# Patient Record
Sex: Female | Born: 1940 | Race: White | Hispanic: No | Smoking: Former smoker
Health system: Southern US, Community
[De-identification: ages and names within clinical notes are randomized; demographics above are authoritative.]

## PROBLEM LIST (undated history)

## (undated) DIAGNOSIS — D649 Anemia, unspecified: Secondary | ICD-10-CM

## (undated) DIAGNOSIS — I341 Nonrheumatic mitral (valve) prolapse: Secondary | ICD-10-CM

## (undated) DIAGNOSIS — M1990 Unspecified osteoarthritis, unspecified site: Secondary | ICD-10-CM

## (undated) DIAGNOSIS — F419 Anxiety disorder, unspecified: Secondary | ICD-10-CM

## (undated) DIAGNOSIS — E785 Hyperlipidemia, unspecified: Secondary | ICD-10-CM

## (undated) DIAGNOSIS — E119 Type 2 diabetes mellitus without complications: Secondary | ICD-10-CM

## (undated) DIAGNOSIS — J189 Pneumonia, unspecified organism: Secondary | ICD-10-CM

## (undated) DIAGNOSIS — R011 Cardiac murmur, unspecified: Secondary | ICD-10-CM

## (undated) DIAGNOSIS — K219 Gastro-esophageal reflux disease without esophagitis: Secondary | ICD-10-CM

## (undated) HISTORY — PX: CARPAL TUNNEL RELEASE: SHX101

## (undated) HISTORY — PX: EYE SURGERY: SHX253

## (undated) HISTORY — PX: OTHER SURGICAL HISTORY: SHX169

## (undated) HISTORY — PX: ROTATOR CUFF REPAIR: SHX139

## (undated) HISTORY — PX: ABDOMINAL HYSTERECTOMY: SHX81

## (undated) HISTORY — PX: TONSILLECTOMY: SUR1361

## (undated) HISTORY — DX: Hyperlipidemia, unspecified: E78.5

## (undated) HISTORY — DX: Type 2 diabetes mellitus without complications: E11.9

---

## 1997-11-04 ENCOUNTER — Encounter: Admission: RE | Admit: 1997-11-04 | Discharge: 1998-02-02 | Payer: Self-pay | Admitting: Family Medicine

## 1999-03-18 ENCOUNTER — Emergency Department (HOSPITAL_COMMUNITY): Admission: EM | Admit: 1999-03-18 | Discharge: 1999-03-18 | Payer: Self-pay | Admitting: Emergency Medicine

## 1999-03-18 ENCOUNTER — Encounter: Payer: Self-pay | Admitting: Orthopedic Surgery

## 1999-06-20 ENCOUNTER — Other Ambulatory Visit: Admission: RE | Admit: 1999-06-20 | Discharge: 1999-06-20 | Payer: Self-pay | Admitting: Family Medicine

## 2001-08-08 ENCOUNTER — Encounter: Payer: Self-pay | Admitting: Gynecology

## 2001-08-14 ENCOUNTER — Inpatient Hospital Stay (HOSPITAL_COMMUNITY): Admission: RE | Admit: 2001-08-14 | Discharge: 2001-08-15 | Payer: Self-pay | Admitting: Gynecology

## 2002-08-14 ENCOUNTER — Encounter: Payer: Self-pay | Admitting: Chiropractic Medicine

## 2002-08-14 ENCOUNTER — Encounter: Admission: RE | Admit: 2002-08-14 | Discharge: 2002-08-14 | Payer: Self-pay | Admitting: Chiropractic Medicine

## 2002-12-23 ENCOUNTER — Other Ambulatory Visit: Admission: RE | Admit: 2002-12-23 | Discharge: 2002-12-23 | Payer: Self-pay | Admitting: Gynecology

## 2003-12-24 ENCOUNTER — Other Ambulatory Visit: Admission: RE | Admit: 2003-12-24 | Discharge: 2003-12-24 | Payer: Self-pay | Admitting: Gynecology

## 2004-11-30 ENCOUNTER — Other Ambulatory Visit: Admission: RE | Admit: 2004-11-30 | Discharge: 2004-11-30 | Payer: Self-pay | Admitting: Gynecology

## 2005-01-10 ENCOUNTER — Encounter: Admission: RE | Admit: 2005-01-10 | Discharge: 2005-01-10 | Payer: Self-pay | Admitting: Family Medicine

## 2005-07-01 ENCOUNTER — Ambulatory Visit (HOSPITAL_COMMUNITY): Admission: RE | Admit: 2005-07-01 | Discharge: 2005-07-01 | Payer: Self-pay | Admitting: Neurosurgery

## 2005-12-01 ENCOUNTER — Other Ambulatory Visit: Admission: RE | Admit: 2005-12-01 | Discharge: 2005-12-01 | Payer: Self-pay | Admitting: Gynecology

## 2006-02-06 ENCOUNTER — Encounter: Admission: RE | Admit: 2006-02-06 | Discharge: 2006-02-06 | Payer: Self-pay | Admitting: Family Medicine

## 2006-12-06 ENCOUNTER — Other Ambulatory Visit: Admission: RE | Admit: 2006-12-06 | Discharge: 2006-12-06 | Payer: Self-pay | Admitting: Gynecology

## 2007-02-11 ENCOUNTER — Encounter: Admission: RE | Admit: 2007-02-11 | Discharge: 2007-02-11 | Payer: Self-pay | Admitting: Family Medicine

## 2007-10-01 ENCOUNTER — Emergency Department (HOSPITAL_COMMUNITY): Admission: EM | Admit: 2007-10-01 | Discharge: 2007-10-01 | Payer: Self-pay | Admitting: Emergency Medicine

## 2010-05-20 NOTE — Op Note (Signed)
Rose Smith, Rose Smith                      ACCOUNT NO.:  [id number]   MEDICAL RECORD NO.:  [id number]                   PATIENT TYPE:  INP   LOCATION:  X001                                 FACILITY:  [facility]   PHYSICIAN:  Lyndal Pulley. Chales Salmon, M.D.                DATE OF BIRTH:  1940-01-07   DATE OF PROCEDURE:  08/14/2001  DATE OF DISCHARGE:                                 OPERATIVE REPORT   PREOPERATIVE DIAGNOSES:  1. Bilateral upper eyelid ptosis and dermatochalasis.  2. 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  3. Submental lipodystrophy.  4. Full face dermatochalasis.   POSTOPERATIVE DIAGNOSES:  1. Bilateral upper eyelid ptosis and dermatochalasis.  2. 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  3. Submental lipodystrophy.  4. Full face dermatochalasis.   OPERATIONS PERFORMED:  1. Submental liposuction.  2. Bilateral upper eyelid blepharoplasties with ptosis repair (levator     repair).  3. Bilateral lower eyelid transconjunctival blepharoplasty.  4. Full face medium depth chemical peel.   SURGEON:  Lyndal Pulley. Chales Salmon, M.D.   FIRST ASSISTANT:  Dr. Vedia Coffer.   ANESTHESIA:  General via oral endotracheal tube.   INDICATIONS FOR PROCEDURE:  The patient is a 70 year old lady who is  simultaneously undergoing gynecological surgery with Dr. Nicholas Lose. She was seen  in the office complaining of difficulties with her upper eyelids and was  diagnosed with bilateral upper eyelid ptosis.   DESCRIPTION OF PROCEDURE:  The surgery followed Dr. Johnn Hai gynecologic  surgery. The patient was already under anesthesia via oral endotracheal  tube. Once Dr. Nicholas Lose had completed his surgery, attention was directed to  the head and neck area where the patient was prepped and draped in the usual  fashion for facial cosmetic procedures. Attention initially was directed to  the submental area where there was small stab incision in the submental  crease.   Approximately 100 cc of tumescent fluid was infiltrated in the  subcutaneous plane. The tumescent fluid consisted of 500 cc of lactated  Ringer's, 400 mg of lidocaine and 1 mg of epinephrine.   Approximately ten minutes were allowed for hemostasis and anesthesia and  through the small stab incision, a 3 mm spatula-shaped liposuction cannula  was used to create a subcutaneous dissection plane. This was carried out  over the entire submental and submandibular areas. Two other small stab  incisions were created just beneath the earlobes bilaterally where the  submandibular areas were treated further. A 4 mm cobra tip liposuction  cannula was then used to complete the liposuction in a subcutaneous,  supraepithelial plane. The pinch test was used for adequate removal of fat  and symmetry. The liposuction was also performed through the lobular  incisions as well, further defining the submandibular areas.  Once the  liposuction was completed, the small fat incisions were closed with 5-0  plain gut suture.   Attention was then directed to the upper eyelid where  a fine surgical marker  was used to outline a new supratarsal crease. This was measured  approximately 9 mm from the lash line.  A micro-T forceps was then used to  determine the amount of excess upper eyelid skin to remove and again  outlined with a fine surgical marker. Approximately 2 cc of 2% lidocaine  with 100,000 epinephrine was infiltrated subcutaneously along the entire  right upper eyelid. The same was done for the left upper eyelid and  approximately 15 minutes was allowed for hemostasis and anesthesia. A #15  scalpel was used to make the incision and fine-toothed forceps and soft  tissue scissors were used to remove the excess upper eyelid skin down to the  underlying orbicularis oculi muscle. Hemostasis was achieved throughout the  procedure with monopolar electrocautery. A strip of orbicularis muscle was  then also removed  down to the underlying orbital septum.   Using globe pressure, the two upper eyelid fat pads were identified and  through small incisions made in the orbital septum, the herniated fat was  cauterized at its base and removed. The orbital septum was then dissected  free from the underlying levator muscle and it was found to have a large  dehiscence. The superior edge of the tarsal plate was identified and using a  6-0 Vicryl suture, a single interrupted stitch was placed through the  superior margin of the tarsal plate to the free end of the head of the  levator muscle. This corrected the dehiscence in the levator muscle. The  skin was then closed first placing three separate interrupted tarsal  fixation sutures to better define the supratarsal crease. The remaining  wound was then closed with 6-0 plain gut suture in a running baseball  fashion.   Attention was then directed to the left upper eyelid where the identical  procedure was performed taking care to produce symmetry. Approximately 3 cc  of 2% lidocaine with 1:100,000 epinephrine was then infiltrated in a  subconjunctival plane in both lower eyelids. The globe, __________  retractor and Lacrilube solution and using adequate retraction, a  transconjunctival incision was created with a pinpoint electrocautery. The  incision was made through the conjunctiva and capsulopalpebral fascia  identifying the underlying three lower eyelid fat pads. Again using light  globe pressure, the herniated fat pads were identified, cauterized at their  base and removed.   An identical procedure was performed on the left lower eyelid again taking  care to produce symmetry. Once the blepharoplasties were done, the entire  face was then degreased with an acetone solution. This was followed by  modified Jessner's solution and then followed by 35% PPA solution. This  produced a fairly easy frosting over the entire face completing the full facial chemical  peel. Plain 35% PPA was then used to feather the chemical  peel just below the inferior border of the mandible bilaterally. Once the  frosting was complete, the entire face and incision lines were coated with a  Neosporin ointment. A 1/4 inch foam dressing was placed in the submental  area and held secure with a 4 inch Ace wrap.   The patient was then allowed to awaken from the anesthesia, extubated in the  operating room and transferred to the postanesthesia care unit in stable  condition having tolerated the procedure well. Estimated blood loss was less  than 20 cc. Intraoperative medications included a gram of Ancef and 10 mg of  Decadron.  Lyndal Pulley Chales Salmon, M.D.    TGO/MEDQ  D:  08/14/2001  T:  08/16/2001  Job:  [id number]

## 2010-05-20 NOTE — Discharge Summary (Signed)
NAME:  Rose Smith, Rose Smith                       ACCOUNT NO.:  [id number]   MEDICAL RECORD NO.:  [id number]                   PATIENT TYPE:  INP   LOCATION:  0457                                 FACILITY:  [facility]   PHYSICIAN:  Gretta Cool, M.D.              DATE OF BIRTH:  1940-08-11   DATE OF ADMISSION:  08/14/2001  DATE OF DISCHARGE:  08/15/2001                                 DISCHARGE SUMMARY   HISTORY OF PRESENT ILLNESS:  The patient is a 70 year old female gravida 4,  para 4 who has a history of large deliveries with her last two children  weighing 10 and 11 pounds delivered in [place], [place].  Since  those deliveries she has had difficulty with constipation chronically and  straining with stool.  She has developed pelvic organ prolapse and now  significant difficulty with urge incontinence.  She does report minor stress  component as well.  Neurologic examination of the perineum shows intact  reflexes and sensation.  There is no evidence of neurologic injury to the  lower extremities.  There is evidence of a fourth degree extension of her  episiotomy with poor repair of perineal musculature.  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  She is now admitted for  definitive therapy by posterior enterocele repairs and vaginal vault  suspension.  She has had history of hysterectomy elsewhere vaginally.  Risks  and benefits have been discussed with the patient and she accepts these  procedures.   PHYSICAL EXAMINATION:  CHEST:  Clear to A&P.  HEART:  Rate and rhythm were regular without murmur, gallop, cardiac  enlargement.  ABDOMEN:  Soft and scaphoid without masses or organomegaly.  PELVIC:  External genitalia within normal limits for female with the  exception of a gaping introitus very much widened.  She has reasonably well  preserved anterior support but the vaginal cuff extends to the mid vagina.  There is extremely poor posterior  support with a grade 3 enterocele and a  grade 4 rectocele.  She has virtually complete loss of the perineal body  musculature.  A very thin sphincter with weak internal and external  sphincter.  The rectovaginal examination confirms.  Cervix and uterus are  surgically absent.  Adnexa bilaterally clear.   IMPRESSION:  1. 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  2. 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  3. Dyslipidemia.  4. Depression.  5. Arthritis.   PLAN:  Procedures, risks, and benefits as well as recovery have been  discussed at length with the patient and she accepts these procedures.   LABORATORY DATA:  Admission hemoglobin 13.8, hematocrit 39.4.  EKG:  Normal  sinus rhythm with nonspecific T-wave abnormality, minor.  Chest x-ray:  No  active lung disease, hyperaeration suggestive of COPD.  HOSPITAL COURSE:  The patient underwent rectocele and enterocele and vault  repairs under general anesthesia.  The procedures were completed without any  complications and the patient was returned to the recovery room in excellent  condition.  Banana suprapubic Cystocath was placed and secured.  There were  no complications and the patient was returned to the recovery room in  excellent condition.  Her postoperative course was without complications and  she was discharged on the second postoperative day in excellent condition.   DISCHARGE INSTRUCTIONS:  Her final discharge instructions included no heavy  lifting or straining, no vaginal entrance, and increased ambulation as  tolerated.  She is to call for any fever over 100.5 or failure of daily  improvement.   DIET:  Regular.   DISCHARGE MEDICATIONS:  Tylox one p.o. q.2-4h. p.r.n. discomfort.  Preoperative medications as prescribed.   FOLLOW UP:  She is to return to the office in 24-72 hours to remove the  suprapubic  catheter.   FINAL DISCHARGE DIAGNOSES:  1. Pelvic organ prolapse.  2. Rectocele and enterocele.  3. Vaginal vault descensus.   PROCEDURES PERFORMED:  Posterior enterocele repairs and vaginal vault  repairs under general anesthesia.     Rose Smith, N.P.                          Gretta Cool, M.D.    EMK/MEDQ  D:  08/28/2001  T:  08/28/2001  Job:  [id number]

## 2010-05-20 NOTE — Op Note (Signed)
Rose Smith, Rose Smith                      ACCOUNT NO.:  [id number]   MEDICAL RECORD NO.:  [id number]                   PATIENT TYPE:  INP   LOCATION:  0457                                 FACILITY:  [facility]   PHYSICIAN:  Gretta Cool, M.D.              DATE OF BIRTH:  07/19/1940   DATE OF PROCEDURE:  08/14/2001  DATE OF DISCHARGE:  08/15/2001                                 OPERATIVE REPORT   PREOPERATIVE DIAGNOSES:  1. Pelvic organ prolapse.  2. Rectocele and enterocele.  3. Vault descensus.   POSTOPERATIVE DIAGNOSES:  1. Pelvic organ prolapse.  2. Rectocele and enterocele.  3. Vault descensus.   PROCEDURE:  T&E and vault repairs.   SURGEON:  Gretta Cool, M.D.   ASSISTANT:  Raynald Kemp, M.D.   ANESTHESIA:  General orotracheal anesthesia.   DESCRIPTION OF PROCEDURE:  Under excellent general anesthesia as above with  the patient prepped and draped in the lithotomy position in [place] stirrups,  mucosa of the posterior vagina was grasped at the hymenal ring with Allis  clamps and measured to allow adequate bidigital introitus.  At this point,  the intervening mucosa was excised in a V-shaped incision. The mucosa was  then dissected from the perirectal fascia to the apex of the vaginal cuff.  At this point, the mucosa was reflected by blunt and sharp dissection.  An  enormous enterocele was exposed. There was transverse detachment of the  fascia from the apex of the cuff.  The enterocele occupied the upper half of  the vagina.  At this point, the enterocele sac was excised and plicated with  two pursestring sutures of 2-0 Vicryl.  At this point, the vaginal vault  suspension was completed as a cardinal uterosacral complex colposuspension.  The uterosacral complex was identified and secured with Ethibond sutures to  the lateral vaginal wall.  The vaginal wall was pulled thus back to high  position and secured to the uterosacral cardinal complex.  A central  fascial  defect was also identified and that was approximated from the apex of the  cuff with the fascia secured to the apex of the center of the cuff with  interrupted sutures of 2-0 Vicryl.  At this point, the mucosa was trimmed  and the upper layers of perirectal fascia and mucosa closed as a single  running subcuticular closure of 2-0 Vicryl.  At this point, the levator  muscles are plicated in the midline and the levator fascia was ligated in  the midline.  The perineal body musculature was reapproximated with  interrupted sutures of 2-0 Vicryl.  The remainder of the vaginal mucosa and  skin was closed with a subcuticular closure of 2-0 Vicryl.  At the end of  the procedure, sponge and pack counts were correct.  The banana suprapubic Cystocath was placed and secured with 0 Ethibond to  allow for the possibility that she may not void  immediately postoperatively  in order to have the suprapubic catheter in timely fashion if she has  voiding delay.                                               Gretta Cool, M.D.    CWL/MEDQ  D:  08/14/2001  T:  08/16/2001  Job:  [id number]   cc:   Talmadge Coventry, M.D.

## 2010-05-20 NOTE — H&P (Signed)
NAME:  Rose Smith, Rose Smith                       ACCOUNT NO.:  [id number]   MEDICAL RECORD NO.:  [id number]                   PATIENT TYPE:  INP   LOCATION:  0457                                 FACILITY:  [facility]   PHYSICIAN:  Gretta Cool, M.D.              DATE OF BIRTH:  Sep 30, 1940   DATE OF ADMISSION:  08/14/2001  DATE OF DISCHARGE:  08/15/2001                                HISTORY & PHYSICAL   CHIEF COMPLAINT:  Pelvic support problems.   HISTORY OF PRESENT ILLNESS:  A 70 year old gravida 4, para 4 under the  primary care of Dr. Sonia Baller.  She has a history of extremely large  deliveries with her last two children weighing 10 and 11 pounds, delivered  by Dr. __________ in [place], [place].  She has had, since those  deliveries, difficulty with constipation chronically and has had straining  at stool.  She has developed progressively severe pelvic organ prolapse and  now has significant difficulty with urge incontinence.  She has minor stress  component as well.  By history, she has difficulty controlling and even  stool.  On neurologic examination of her perineum, she has intact reflexes  and sensation.  She has no evidence of neurologic injury to her lower  extremities.  She has evidence of an extensive fourth degree extension of  her episiotomy with poor repair of her perineal body musculature.  She has  enormous enterocele and rectocele with some vault descensus.  She is now  admitted for definitive therapy by posterior and enterocele repairs and  vault suspension.  She has a previous history of hysterectomy elsewhere by  Dr. Victoriano Lain, vaginal hysterectomy by history.  She understands the risks and  benefits of the procedure, also understands that we will not address the  problem of incontinence of gas and stool.  Have discussed the poor response  of repairs in patients over 60, so far as reports from the literature, even  in the best of hands.  We have also  discussed the possible need for a sling-  type procedure such as Sparc or TVT,  subsequently, if she has progression  of her urine incontinence.   PAST MEDICAL HISTORY:  She has had the usual childhood diseases without  sequelae.   MEDICAL ILLNESSES:  Arthritis, hypercholesterolemia.   PAST SURGICAL HISTORY:  History of carpal tunnel syndrome and repair.  Tubal  sterilization 1970.  Partial hysterectomy per Dr. Victoriano Lain, vaginal  hysterectomy per Dr. Victoriano Lain in 1986.  Rotator cuff repairs in 1994 and 1996.   ACCIDENTS/INJURIES:  None.   PRESENT MEDICATIONS:  1. Hormone replacement therapy by patch, recently switched from Estratest.  2. Lipitor for dyslipidemia.  3. Bextra for arthritis.  4. Effexor 75 q.d. for depression.   ALLERGIES:  PENICILLIN.   FAMILY HISTORY:  Father died at age 17 during hip surgery.  Mother is 37  with dementia.  Father had prostate  cancer.  Mother has hypertension.  No  other known familial tendencies.   SOCIAL HISTORY:  The patient is an only child.  She has four children of her  own.   REVIEW OF SYMPTOMS:  HEENT:  Denies symptoms.  CARDIORESPIRATORY:  Denies  asthma, cough, bronchitis, shortness of breath.  GI/GU:  Denies frequency,  urgency, dysuria, change in bowel habits, food intolerance.   PHYSICAL EXAMINATION:  GENERAL APPEARANCE:  A well-developed, well-nourished  white female considerably over ideal weight but high abdomen to hip ratio.  HEENT:  Pupils are equal, round, reactive to light and accommodation.  Fundi  not examined.  NECK:  Supple without mass or thyroid enlargement.  Carotids have good  pulses without bruits.  Node bearing areas are negative.  BREASTS:  Soft without mass, nodes or nipple discharge.  CHEST:  Clear to percussion and auscultation  CARDIOVASCULAR:  Regular rhythm without murmur or cardiac enlargement.  ABDOMEN:  Soft without mass or organomegaly.  PELVIC:  External genitalia normal female but gaping introitus with  very  much widened genital hiatus.  She has reasonably well preserved anterior  support but the vaginal cuff descends to mid vagina.  She has extremely poor  posterior support with a grade III enterocele and grade IV rectocele.  She  has virtually complete loss of the perineal body musculature and very thin  sphincter with weak internal and external sphincter.  Rectovaginal  confirmed.  Cervix and uterus surgically absent.  Adnexa clear.  EXTREMITIES:  Negative.  NEUROLOGIC:  Physiologic.   IMPRESSION:  1. 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  2. 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  3. Dyslipidemia.  4. Depression.  5. Arthritis.   PLAN:  I have discussed all options with the patient as well as risks and  benefits.  She understands particularly the risk of long delay of return to  comfortable intercourse and possible need for dilators and vaginal estrogen  hormone replacement. She understands also the possible need for subsequent  incontinence surgery such at TVT or Sparc.  She also understands that her  incontinence of gas and stool may not be significantly improved.  She also  understands that she must always avoid straining at stool to avoid injuring  her repair and further progression of her support weakness.                                                Gretta Cool, M.D.    CWL/MEDQ  D:  08/14/2001  T:  08/16/2001  Job:  [id number]   cc:   Talmadge Coventry, M.D.

## 2010-10-03 LAB — DIFFERENTIAL
Basophils Relative: 0
Eosinophils Absolute: 0.2
Eosinophils Relative: 2
Lymphs Abs: 1.9
Monocytes Relative: 6
Neutrophils Relative %: 73

## 2010-10-03 LAB — POCT I-STAT, CHEM 8
Creatinine, Ser: 1.1
Glucose, Bld: 136 — ABNORMAL HIGH
HCT: 42
Hemoglobin: 14.3
Potassium: 3.6
TCO2: 30

## 2010-10-03 LAB — URINALYSIS, ROUTINE W REFLEX MICROSCOPIC
Bilirubin Urine: NEGATIVE
Ketones, ur: NEGATIVE
Nitrite: NEGATIVE
Protein, ur: NEGATIVE
Specific Gravity, Urine: 1.009
Urobilinogen, UA: 0.2

## 2010-10-03 LAB — CBC
HCT: 40.6
MCHC: 33.5
MCV: 91.5
Platelets: 323

## 2010-10-03 LAB — URINE MICROSCOPIC-ADD ON

## 2011-04-23 ENCOUNTER — Other Ambulatory Visit: Payer: Self-pay | Admitting: Orthopedic Surgery

## 2011-04-23 MED ORDER — DEXAMETHASONE SODIUM PHOSPHATE 10 MG/ML IJ SOLN: 10.0000 mg | Freq: Once | INTRAMUSCULAR | Status: DC

## 2011-04-23 MED ORDER — BUPIVACAINE LIPOSOME 1.3 % IJ SUSP: 20.0000 mL | Freq: Once | INTRAMUSCULAR | Status: DC

## 2011-04-30 ENCOUNTER — Other Ambulatory Visit: Payer: Self-pay | Admitting: Orthopedic Surgery

## 2011-04-30 MED ORDER — DEXAMETHASONE SODIUM PHOSPHATE 10 MG/ML IJ SOLN: 10.0000 mg | Freq: Once | INTRAMUSCULAR | Status: DC

## 2011-04-30 MED ORDER — BUPIVACAINE LIPOSOME 1.3 % IJ SUSP: 20.0000 mL | Freq: Once | INTRAMUSCULAR | Status: DC

## 2011-07-14 ENCOUNTER — Encounter (HOSPITAL_COMMUNITY): Payer: Self-pay | Admitting: Pharmacy Technician

## 2011-07-20 NOTE — Patient Instructions (Signed)
20 ENZLEY KITCHENS  07/20/2011   Your procedure is scheduled on:  07/26/11 1000-1100  Report to [facility] at 0730 AM.  Call this number if you have problems the morning of surgery: [phone]   Remember:   Do not eat food:After Midnight.  May have clear liquids:until Midnight .   Take these medicines the morning of surgery with A SIP OF WATER:    Do not wear jewelry, make-up or nail polish.  Do not wear lotions, powders, or perfumes  Do not shave 48 hours prior to surgery.   Do not bring valuables to the hospital.  Contacts, dentures or bridgework may not be worn into surgery.  Leave suitcase in the car. After surgery it may be brought to your room.  For patients admitted to the hospital, checkout time is 11:00 AM the day of discharge.      Special Instructions: CHG Shower Use Special Wash: 1/2 bottle night before surgery and 1/2 bottle morning of surgery. shower chin to toes with CHG.  Wash face and private parts with regular soap.     Please read over the following fact sheets that you were given: MRSA Information, coughing and deep breathing exercises, leg exercises

## 2011-07-21 ENCOUNTER — Encounter (HOSPITAL_COMMUNITY)
Admission: RE | Admit: 2011-07-21 | Discharge: 2011-07-21 | Disposition: A | Discharge status: Home / Self Care | Payer: Medicare Other | Source: Ambulatory Visit | Attending: Orthopedic Surgery | Admitting: Orthopedic Surgery

## 2011-07-21 ENCOUNTER — Encounter (HOSPITAL_COMMUNITY): Payer: Self-pay

## 2011-07-21 HISTORY — DX: Cardiac murmur, unspecified: R01.1

## 2011-07-21 HISTORY — DX: Unspecified osteoarthritis, unspecified site: M19.90

## 2011-07-21 LAB — CBC
Hemoglobin: 11.9 g/dL — ABNORMAL LOW (ref 12.0–15.0)
MCH: 28.7 pg (ref 26.0–34.0)
MCV: 87.7 fL (ref 78.0–100.0)
RBC: 4.14 MIL/uL (ref 3.87–5.11)

## 2011-07-21 LAB — BASIC METABOLIC PANEL
CO2: 25 mEq/L (ref 19–32)
Chloride: 104 mEq/L (ref 96–112)
Glucose, Bld: 97 mg/dL (ref 70–99)
Potassium: 3.8 mEq/L (ref 3.5–5.1)
Sodium: 139 mEq/L (ref 135–145)

## 2011-07-26 ENCOUNTER — Encounter (HOSPITAL_COMMUNITY)
Admission: RE | Disposition: A | Discharge status: Home / Self Care | Payer: Self-pay | Source: Ambulatory Visit | Attending: Orthopedic Surgery

## 2011-07-26 ENCOUNTER — Ambulatory Visit (HOSPITAL_COMMUNITY): Payer: Medicare Other | Admitting: Anesthesiology

## 2011-07-26 ENCOUNTER — Encounter (HOSPITAL_COMMUNITY): Payer: Self-pay | Admitting: *Deleted

## 2011-07-26 ENCOUNTER — Encounter (HOSPITAL_COMMUNITY): Payer: Self-pay | Admitting: Anesthesiology

## 2011-07-26 ENCOUNTER — Ambulatory Visit (HOSPITAL_COMMUNITY)
Admission: RE | Admit: 2011-07-26 | Discharge: 2011-07-27 | Disposition: A | Discharge status: Home / Self Care | Payer: Medicare Other | Source: Ambulatory Visit | Attending: Orthopedic Surgery | Admitting: Orthopedic Surgery

## 2011-07-26 DIAGNOSIS — M6688 Spontaneous rupture of other tendons, other: Secondary | ICD-10-CM | POA: Insufficient documentation

## 2011-07-26 DIAGNOSIS — Z01812 Encounter for preprocedural laboratory examination: Secondary | ICD-10-CM | POA: Insufficient documentation

## 2011-07-26 DIAGNOSIS — M76899 Other specified enthesopathies of unspecified lower limb, excluding foot: Secondary | ICD-10-CM | POA: Insufficient documentation

## 2011-07-26 DIAGNOSIS — I059 Rheumatic mitral valve disease, unspecified: Secondary | ICD-10-CM | POA: Insufficient documentation

## 2011-07-26 DIAGNOSIS — Z01810 Encounter for preprocedural cardiovascular examination: Secondary | ICD-10-CM | POA: Insufficient documentation

## 2011-07-26 DIAGNOSIS — E119 Type 2 diabetes mellitus without complications: Secondary | ICD-10-CM | POA: Insufficient documentation

## 2011-07-26 DIAGNOSIS — Z79899 Other long term (current) drug therapy: Secondary | ICD-10-CM | POA: Insufficient documentation

## 2011-07-26 DIAGNOSIS — M7070 Other bursitis of hip, unspecified hip: Secondary | ICD-10-CM | POA: Diagnosis present

## 2011-07-26 DIAGNOSIS — Z7982 Long term (current) use of aspirin: Secondary | ICD-10-CM | POA: Insufficient documentation

## 2011-07-26 DIAGNOSIS — M47817 Spondylosis without myelopathy or radiculopathy, lumbosacral region: Secondary | ICD-10-CM | POA: Insufficient documentation

## 2011-07-26 HISTORY — PX: EXCISION/RELEASE BURSA HIP: SHX5014

## 2011-07-26 LAB — GLUCOSE, CAPILLARY
Glucose-Capillary: 124 mg/dL — ABNORMAL HIGH (ref 70–99)
Glucose-Capillary: 126 mg/dL — ABNORMAL HIGH (ref 70–99)
Glucose-Capillary: 92 mg/dL (ref 70–99)
Glucose-Capillary: 112 mg/dL — ABNORMAL HIGH (ref 70–99)

## 2011-07-26 SURGERY — RELEASE, BURSA, TROCHANTERIC
Anesthesia: General | Site: Hip | Laterality: Left | Wound class: Clean

## 2011-07-26 MED ORDER — LACTATED RINGERS IV SOLN
INTRAVENOUS | Status: DC
  Administered 2011-07-26: 1000 mL via INTRAVENOUS
  Administered 2011-07-26: 11:00:00 via INTRAVENOUS

## 2011-07-26 MED ORDER — 0.9 % SODIUM CHLORIDE (POUR BTL) OPTIME
TOPICAL | Status: DC | PRN
  Administered 2011-07-26: 1000 mL

## 2011-07-26 MED ORDER — SODIUM CHLORIDE 0.9 % IV SOLN: INTRAVENOUS | Status: DC

## 2011-07-26 MED ORDER — BUPIVACAINE LIPOSOME 1.3 % IJ SUSP
INTRAMUSCULAR | Status: DC | PRN
  Administered 2011-07-26: 20 mL

## 2011-07-26 MED ORDER — SUCCINYLCHOLINE CHLORIDE 20 MG/ML IJ SOLN
INTRAMUSCULAR | Status: DC | PRN
  Administered 2011-07-26: 100 mg via INTRAVENOUS

## 2011-07-26 MED ORDER — ONDANSETRON HCL 4 MG PO TABS: 4.0000 mg | ORAL_TABLET | Freq: Four times a day (QID) | ORAL | Status: DC | PRN

## 2011-07-26 MED ORDER — FENTANYL CITRATE 0.05 MG/ML IJ SOLN
INTRAMUSCULAR | Status: AC
  Filled 2011-07-26: qty 2

## 2011-07-26 MED ORDER — CHLORHEXIDINE GLUCONATE 4 % EX LIQD
60.0000 mL | Freq: Once | CUTANEOUS | Status: DC
  Filled 2011-07-26: qty 60

## 2011-07-26 MED ORDER — ENOXAPARIN SODIUM 40 MG/0.4ML ~~LOC~~ SOLN
40.0000 mg | SUBCUTANEOUS | Status: DC
  Administered 2011-07-27: 40 mg via SUBCUTANEOUS
  Filled 2011-07-26 (×2): qty 0.4

## 2011-07-26 MED ORDER — CEFAZOLIN SODIUM-DEXTROSE 2-3 GM-% IV SOLR
2.0000 g | INTRAVENOUS | Status: AC
  Administered 2011-07-26: 2 g via INTRAVENOUS

## 2011-07-26 MED ORDER — LIDOCAINE HCL (CARDIAC) 20 MG/ML IV SOLN
INTRAVENOUS | Status: DC | PRN
  Administered 2011-07-26: 80 mg via INTRAVENOUS

## 2011-07-26 MED ORDER — CISATRACURIUM BESYLATE (PF) 10 MG/5ML IV SOLN
INTRAVENOUS | Status: DC | PRN
  Administered 2011-07-26: 4 ug via INTRAVENOUS

## 2011-07-26 MED ORDER — KCL IN DEXTROSE-NACL 20-5-0.9 MEQ/L-%-% IV SOLN
INTRAVENOUS | Status: DC
  Administered 2011-07-26: 19:00:00 via INTRAVENOUS
  Filled 2011-07-26 (×3): qty 1000

## 2011-07-26 MED ORDER — GLYCOPYRROLATE 0.2 MG/ML IJ SOLN
INTRAMUSCULAR | Status: DC | PRN
  Administered 2011-07-26: .5 mg via INTRAVENOUS
  Administered 2011-07-26: .2 mg via INTRAVENOUS

## 2011-07-26 MED ORDER — METOCLOPRAMIDE HCL 10 MG PO TABS: 5.0000 mg | ORAL_TABLET | Freq: Three times a day (TID) | ORAL | Status: DC | PRN

## 2011-07-26 MED ORDER — METHOCARBAMOL 500 MG PO TABS
500.0000 mg | ORAL_TABLET | Freq: Four times a day (QID) | ORAL | Status: DC | PRN
  Administered 2011-07-26: 500 mg via ORAL
  Filled 2011-07-26: qty 1

## 2011-07-26 MED ORDER — CEFAZOLIN SODIUM-DEXTROSE 2-3 GM-% IV SOLR
INTRAVENOUS | Status: AC
  Filled 2011-07-26: qty 50

## 2011-07-26 MED ORDER — METFORMIN HCL ER 500 MG PO TB24
1000.0000 mg | ORAL_TABLET | Freq: Every evening | ORAL | Status: DC
  Filled 2011-07-26 (×2): qty 2

## 2011-07-26 MED ORDER — SIMVASTATIN 20 MG PO TABS
20.0000 mg | ORAL_TABLET | Freq: Every day | ORAL | Status: DC
  Administered 2011-07-26: 20 mg via ORAL
  Filled 2011-07-26 (×2): qty 1

## 2011-07-26 MED ORDER — BUPIVACAINE LIPOSOME 1.3 % IJ SUSP
20.0000 mL | Freq: Once | INTRAMUSCULAR | Status: DC
  Filled 2011-07-26 (×2): qty 20

## 2011-07-26 MED ORDER — ASPIRIN EC 81 MG PO TBEC
81.0000 mg | DELAYED_RELEASE_TABLET | Freq: Every day | ORAL | Status: DC
  Administered 2011-07-26 – 2011-07-27 (×2): 81 mg via ORAL
  Filled 2011-07-26 (×2): qty 1

## 2011-07-26 MED ORDER — PREGABALIN 75 MG PO CAPS
75.0000 mg | ORAL_CAPSULE | Freq: Every day | ORAL | Status: DC
  Filled 2011-07-26: qty 1

## 2011-07-26 MED ORDER — MIDAZOLAM HCL 5 MG/5ML IJ SOLN
INTRAMUSCULAR | Status: DC | PRN
  Administered 2011-07-26: 2 mg via INTRAVENOUS

## 2011-07-26 MED ORDER — PROPOFOL 10 MG/ML IV BOLUS
INTRAVENOUS | Status: DC | PRN
  Administered 2011-07-26: 150 mg via INTRAVENOUS
  Administered 2011-07-26 (×2): 50 mg via INTRAVENOUS

## 2011-07-26 MED ORDER — DEXTROSE 5 % IV SOLN
500.0000 mg | Freq: Four times a day (QID) | INTRAVENOUS | Status: DC | PRN
  Administered 2011-07-26: 500 mg via INTRAVENOUS
  Filled 2011-07-26: qty 5

## 2011-07-26 MED ORDER — FENTANYL CITRATE 0.05 MG/ML IJ SOLN
INTRAMUSCULAR | Status: DC | PRN
  Administered 2011-07-26: 100 ug via INTRAVENOUS

## 2011-07-26 MED ORDER — METOCLOPRAMIDE HCL 5 MG/ML IJ SOLN: 5.0000 mg | Freq: Three times a day (TID) | INTRAMUSCULAR | Status: DC | PRN

## 2011-07-26 MED ORDER — LACTATED RINGERS IV SOLN: INTRAVENOUS | Status: DC

## 2011-07-26 MED ORDER — SODIUM CHLORIDE 0.9 % IJ SOLN
INTRAMUSCULAR | Status: DC | PRN
  Administered 2011-07-26: 50 mL

## 2011-07-26 MED ORDER — ONDANSETRON HCL 4 MG/2ML IJ SOLN: 4.0000 mg | Freq: Four times a day (QID) | INTRAMUSCULAR | Status: DC | PRN

## 2011-07-26 MED ORDER — FENTANYL CITRATE 0.05 MG/ML IJ SOLN
25.0000 ug | INTRAMUSCULAR | Status: AC | PRN
  Administered 2011-07-26 (×6): 25 ug via INTRAVENOUS

## 2011-07-26 MED ORDER — BLISTEX EX OINT
TOPICAL_OINTMENT | CUTANEOUS | Status: AC
  Filled 2011-07-26: qty 10

## 2011-07-26 MED ORDER — ACETAMINOPHEN 10 MG/ML IV SOLN
1000.0000 mg | Freq: Once | INTRAVENOUS | Status: AC
  Administered 2011-07-26: 1000 mg via INTRAVENOUS

## 2011-07-26 MED ORDER — ONDANSETRON HCL 4 MG/2ML IJ SOLN
INTRAMUSCULAR | Status: DC | PRN
  Administered 2011-07-26: 4 mg via INTRAVENOUS

## 2011-07-26 MED ORDER — ATROPINE SULFATE 0.4 MG/ML IJ SOLN
INTRAMUSCULAR | Status: DC | PRN
  Administered 2011-07-26: 0.4 mg via INTRAVENOUS

## 2011-07-26 MED ORDER — DICLOFENAC SODIUM 75 MG PO TBEC
75.0000 mg | DELAYED_RELEASE_TABLET | Freq: Every day | ORAL | Status: DC
  Administered 2011-07-26: 75 mg via ORAL
  Filled 2011-07-26 (×2): qty 1

## 2011-07-26 MED ORDER — OXYCODONE-ACETAMINOPHEN 5-325 MG PO TABS
1.0000 | ORAL_TABLET | ORAL | Status: DC | PRN
  Administered 2011-07-26 – 2011-07-27 (×3): 2 via ORAL
  Filled 2011-07-26 (×3): qty 2

## 2011-07-26 MED ORDER — ACETAMINOPHEN 10 MG/ML IV SOLN
INTRAVENOUS | Status: AC
  Filled 2011-07-26: qty 100

## 2011-07-26 MED ORDER — NEOSTIGMINE METHYLSULFATE 1 MG/ML IJ SOLN
INTRAMUSCULAR | Status: DC | PRN
  Administered 2011-07-26: 2.5 mg via INTRAVENOUS

## 2011-07-26 MED ORDER — MORPHINE SULFATE 2 MG/ML IJ SOLN
1.0000 mg | INTRAMUSCULAR | Status: DC | PRN
  Administered 2011-07-26 (×2): 2 mg via INTRAVENOUS
  Filled 2011-07-26 (×2): qty 1

## 2011-07-26 SURGICAL SUPPLY — 40 items
ANCH SUT 2 CP-2 EBND QANCHR+ (Anchor) ×2 IMPLANT
ANCHOR SUPER QUICK (Anchor) ×4 IMPLANT
BAG SPEC THK2 15X12 ZIP CLS (MISCELLANEOUS) ×1
BAG ZIPLOCK 12X15 (MISCELLANEOUS) ×2 IMPLANT
BIT DRILL 2.8 QUICK RELEASE (BIT) ×1 IMPLANT
BLADE EXTENDED COATED 6.5IN (ELECTRODE) IMPLANT
CLOTH BEACON ORANGE TIMEOUT ST (SAFETY) ×2 IMPLANT
DRAPE INCISE IOBAN 66X45 STRL (DRAPES) ×2 IMPLANT
DRAPE ORTHO SPLIT 77X108 STRL (DRAPES) ×4
DRAPE POUCH INSTRU U-SHP 10X18 (DRAPES) ×2 IMPLANT
DRAPE SURG ORHT 6 SPLT 77X108 (DRAPES) ×2 IMPLANT
DRAPE U-SHAPE 47X51 STRL (DRAPES) ×2 IMPLANT
DRILL 2.8 QUICK RELEASE (BIT) ×2
DRSG ADAPTIC 3X8 NADH LF (GAUZE/BANDAGES/DRESSINGS) ×2 IMPLANT
DRSG MEPILEX BORDER 4X4 (GAUZE/BANDAGES/DRESSINGS) IMPLANT
DRSG MEPILEX BORDER 4X8 (GAUZE/BANDAGES/DRESSINGS) IMPLANT
ELECT REM PT RETURN 9FT ADLT (ELECTROSURGICAL) ×2
ELECTRODE REM PT RTRN 9FT ADLT (ELECTROSURGICAL) ×1 IMPLANT
GLOVE BIO SURGEON STRL SZ7.5 (GLOVE) ×2 IMPLANT
GLOVE BIO SURGEON STRL SZ8 (GLOVE) ×4 IMPLANT
GOWN STRL NON-REIN LRG LVL3 (GOWN DISPOSABLE) ×2 IMPLANT
GOWN STRL REIN XL XLG (GOWN DISPOSABLE) ×2 IMPLANT
IV SET HUBERPLUS 22X1 SAFETY (NEEDLE) ×2 IMPLANT
MANIFOLD NEPTUNE II (INSTRUMENTS) ×2 IMPLANT
NS IRRIG 1000ML POUR BTL (IV SOLUTION) ×2 IMPLANT
PACK TOTAL JOINT (CUSTOM PROCEDURE TRAY) ×2 IMPLANT
PASSER SUT SWANSON 36MM LOOP (INSTRUMENTS) ×2 IMPLANT
POSITIONER SURGICAL ARM (MISCELLANEOUS) ×2 IMPLANT
SPONGE GAUZE 4X4 12PLY (GAUZE/BANDAGES/DRESSINGS) ×2 IMPLANT
STAPLER VISISTAT 35W (STAPLE) ×2 IMPLANT
STRIP CLOSURE SKIN 1/2X4 (GAUZE/BANDAGES/DRESSINGS) ×2 IMPLANT
SUT ETHIBOND NAB CT1 #1 30IN (SUTURE) ×4 IMPLANT
SUT MNCRL AB 4-0 PS2 18 (SUTURE) ×2 IMPLANT
SUT VIC AB 1 CT1 27 (SUTURE) ×6
SUT VIC AB 1 CT1 27XBRD ANTBC (SUTURE) ×3 IMPLANT
SUT VIC AB 2-0 CT1 27 (SUTURE) ×6
SUT VIC AB 2-0 CT1 TAPERPNT 27 (SUTURE) ×3 IMPLANT
SYR 30ML LL (SYRINGE) ×2 IMPLANT
TOWEL OR 17X26 10 PK STRL BLUE (TOWEL DISPOSABLE) ×4 IMPLANT
WATER STERILE IRR 1500ML POUR (IV SOLUTION) ×2 IMPLANT

## 2011-07-26 NOTE — Transfer of Care (Signed)
Immediate Anesthesia Transfer of Care Note  Patient: Rose Smith  Procedure(s) Performed: Procedure(s) (LRB): EXCISION/RELEASE BURSA HIP (Left)  Patient Location: PACU  Anesthesia Type: General  Level of Consciousness: awake, oriented and sedated  Airway & Oxygen Therapy: Patient Spontanous Breathing and Patient connected to face mask oxygen  Post-op Assessment: Report given to PACU RN  Post vital signs: Reviewed and stable  Complications: No apparent anesthesia complications

## 2011-07-26 NOTE — Brief Op Note (Signed)
07/26/2011  11:08 AM  PATIENT:  Rose Smith  71 y.o. female  PRE-OPERATIVE DIAGNOSIS:  left hip bursitis with gluteal tendon tear  POST-OPERATIVE DIAGNOSIS:  Left hip bursitis with gluteal tendon tear  PROCEDURE:  Procedure(s) (LRB): EXCISION/RELEASE BURSA HIP (Left) WITH GLUTEAL TENDON REPAIR  SURGEON:  Surgeon(s) and Role:    * Loanne Drilling, MD - Primary  PHYSICIAN ASSISTANT:   ASSISTANTS: Avel Peace, PA-C   ANESTHESIA:   general  EBL:   minimal  BLOOD ADMINISTERED:none  DRAINS: none   LOCAL MEDICATIONS USED:  OTHER [employer]arel  COUNTS:  YES  DICTATION: .Other Dictation: Dictation Number [id number]  PLAN OF CARE: Admit for overnight observation  PATIENT DISPOSITION:  PACU - hemodynamically stable.   Rose Rankin Khaleed Holan, MD    07/26/2011, 11:14 AM

## 2011-07-26 NOTE — Anesthesia Procedure Notes (Signed)
Procedure Name: Intubation Date/Time: 07/26/2011 10:21 AM Performed by: Hulan Fess Pre-anesthesia Checklist: Patient identified, Emergency Drugs available, Suction available, Patient being monitored and Timeout performed Patient Re-evaluated:Patient Re-evaluated prior to inductionOxygen Delivery Method: Circle system utilized Preoxygenation: Pre-oxygenation with 100% oxygen Intubation Type: IV induction Ventilation: Mask ventilation without difficulty Grade View: Grade II Tube type: Oral Tube size: 8.0 mm Number of attempts: 1 Placement Confirmation: ETT inserted through vocal cords under direct vision Secured at: 21 cm Tube secured with: Tape Dental Injury: Teeth and Oropharynx as per pre-operative assessment

## 2011-07-26 NOTE — Anesthesia Preprocedure Evaluation (Addendum)
Anesthesia Evaluation  Patient identified by MRN, date of birth, ID band Patient awake    Reviewed: Allergy & Precautions, H&P , NPO status , Patient's Chart, lab work & pertinent test results  Airway Mallampati: II TM Distance: >3 FB Neck ROM: full    Dental No notable dental hx. (+) Teeth Intact and Dental Advisory Given   Pulmonary neg pulmonary ROS,  breath sounds clear to auscultation  Pulmonary exam normal       Cardiovascular Exercise Tolerance: Good negative cardio ROS  + Valvular Problems/Murmurs MVP Rhythm:regular Rate:Normal  No Sx's with MVP   Neuro/Psych negative neurological ROS  negative psych ROS   GI/Hepatic negative GI ROS, Neg liver ROS,   Endo/Other  negative endocrine ROSWell Controlled, Type 2, Oral Hypoglycemic Agents  Renal/GU negative Renal ROS  negative genitourinary   Musculoskeletal   Abdominal   Peds  Hematology negative hematology ROS (+)   Anesthesia Other Findings   Reproductive/Obstetrics negative OB ROS                          Anesthesia Physical Anesthesia Plan  ASA: II  Anesthesia Plan: General   Post-op Pain Management:    Induction: Intravenous  Airway Management Planned: Oral ETT  Additional Equipment:   Intra-op Plan:   Post-operative Plan: Extubation in OR  Informed Consent: I have reviewed the patients History and Physical, chart, labs and discussed the procedure including the risks, benefits and alternatives for the proposed anesthesia with the patient or authorized representative who has indicated his/her understanding and acceptance.   Dental Advisory Given  Plan Discussed with: CRNA and Surgeon  Anesthesia Plan Comments:        Anesthesia Quick Evaluation
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
## 2011-07-26 NOTE — Anesthesia Postprocedure Evaluation (Signed)
  Anesthesia Post-op Note  Patient: Rose Smith  Procedure(s) Performed: Procedure(s) (LRB): EXCISION/RELEASE BURSA HIP (Left)  Patient Location: PACU  Anesthesia Type: General  Level of Consciousness: awake and alert   Airway and Oxygen Therapy: Patient Spontanous Breathing  Post-op Pain: mild  Post-op Assessment: Post-op Vital signs reviewed, Patient's Cardiovascular Status Stable, Respiratory Function Stable, Patent Airway and No signs of Nausea or vomiting  Post-op Vital Signs: stable  Complications: No apparent anesthesia complications

## 2011-07-26 NOTE — Interval H&P Note (Signed)
History and Physical Interval Note:  07/26/2011 10:20 AM  Rose Smith  has presented today for surgery, with the diagnosis of left hip bursitis with gluteal tendon tear  The various methods of treatment have been discussed with the patient and family. After consideration of risks, benefits and other options for treatment, the patient has consented to  Procedure(s) (LRB): EXCISION/RELEASE BURSA HIP (Left) as a surgical intervention .  The patient's history has been reviewed, patient examined, no change in status, stable for surgery.  I have reviewed the patient's chart and labs.  Questions were answered to the patient's satisfaction.     Loanne Drilling

## 2011-07-27 MED ORDER — METHOCARBAMOL 500 MG PO TABS: 500.0000 mg | ORAL_TABLET | Freq: Four times a day (QID) | ORAL | Status: AC | PRN

## 2011-07-27 MED ORDER — MENTHOL 3 MG MT LOZG
1.0000 | LOZENGE | OROMUCOSAL | Status: DC | PRN
  Filled 2011-07-27 (×2): qty 9

## 2011-07-27 MED ORDER — OXYCODONE-ACETAMINOPHEN 5-325 MG PO TABS: 1.0000 | ORAL_TABLET | ORAL | Status: AC | PRN

## 2011-07-27 NOTE — Op Note (Signed)
NAMEJENNAE, HAKEEM NO.:  [id number]  MEDICAL RECORD NO.:  [id number]  LOCATION:  1604                         FACILITY:  [facility]  PHYSICIAN:  Ollen Gross, M.D.    DATE OF BIRTH:  1940-08-06  DATE OF PROCEDURE:  07/26/2011 DATE OF DISCHARGE:                              OPERATIVE REPORT   PREOPERATIVE DIAGNOSIS:  Intractable bursitis, left hip with gluteal tendon tear.  POSTOPERATIVE DIAGNOSIS:  Intractable bursitis, left hip with gluteal tendon tear.  PROCEDURE:  Left hip bursectomy with gluteal tendon repair.  SURGEON:  Ollen Gross, M.D.  ASSISTANT:  Avel Peace, PA-C.  ANESTHESIA:  General.  ESTIMATED BLOOD LOSS:  Minimal.  DRAINS:  None.  COMPLICATIONS:  None.  CONDITION:  Stable to recovery.  BRIEF CLINICAL NOTE:  Ms. Vezina is a 71 year old female with significant intractable left lateral hip pain.  It has been going on for a long time now.  It is getting progressively worse over time.  It has not responded to intra-articular injection, and did have a partial temporary response to a trochanteric injection.  MRI showed bursitis and probable gluteal tendon tear.  She presents now for bursectomy and gluteal tendon repair.  PROCEDURE IN DETAIL:  After successful administration of general anesthetic, the patient was placed in the right lateral decubitus position with the left side up and held with the hip positioner.  Her left lower extremity was isolated from her perineum with plastic drapes and prepped and draped in the usual sterile fashion.  The lateral-based incision was then made centered over the tip of the greater trochanter about 4 inches in length.  The skin was cut with a #10 blade through subcutaneous tissue to the fascia lata which incised in line with the skin incision.  There was a fluid-filled bursa present underneath the fascia lata.  I resected this with electrocautery.  I palpated the gluteus medius tendon.  There was no  obvious tear but it feels as though it separated on its under surface from the greater trochanter.  I made a vertical split in the tendon and inspected.  In fact, there was separation on the under surface between the tendon and the greater trochanter.  I had a very distal insertion, but there was an area that was there on the trochanter.  In addition, there was a large anterosuperior spur coming off the trochanter and the gluteus medius had detached from that spur.  The spur was removed with a rongeur.  I placed a Mitek suture anchor into the tip of the trochanter and reattached the gluteus medius to the bone utilizing the sutures from the suture anchor. This was very stable repair.  In addition, I placed a second suture anchor about a centimeter distal to the first anchor and passed it through the gluteus medius tendon and weaved along the tendon to get closer approximation to the greater trochanter, essentially expanding its insertion site and make it more stable.  I then repaired the split with the Ethibond suture.  The wound was then copiously irrigated with saline solution.  The fascia lata was closed with interrupted #1 Vicryl leaving open a small triangular area over the tip of the greater  trochanter to prevent it from rubbing.  Subcu was then closed with #1 and 2-0 Vicryl, subcuticular running 4-0 Monocryl.  Note that, 20 mL of Exparel mixed with 50 mL of saline were injected into the fascia lata, gluteal muscles, and subcu tissues.  The subcuticular layer was closed with running 4-0 Monocryl, and the incision cleaned and dried.  Steri- Strips and sterile dressing applied.  She was then awakened and transported to recovery in stable condition.     Ollen Gross, M.D.     FA/MEDQ  D:  07/26/2011  T:  07/27/2011  Job:  [id number]

## 2011-07-27 NOTE — Progress Notes (Signed)
Pt stable, scripts, d/c instructions given with no questions/concerns voiced by patient.  Pt transported via wheelchair to husband's private vehicle by NT.  Pt stated no equipment needed...has walker at home.

## 2011-07-27 NOTE — Progress Notes (Signed)
   Subjective: 1 Day Post-Op Procedure(s) (LRB): EXCISION/RELEASE BURSA HIP (Left) Patient reports pain as mild.   Patient seen in rounds with Dr. Lequita Halt. Patient is well, and has had no acute complaints or problems Patient is ready to go home.  Objective: Vital signs in last 24 hours: Temp:  [96.2 F (35.7 C)-98.4 F (36.9 C)] 96.2 F (35.7 C) (07/25 0504) Pulse Rate:  [50-65] 53  (07/25 0504) Resp:  [8-20] 18  (07/25 0504) BP: (90-178)/(53-78) 96/60 mmHg (07/25 0504) SpO2:  [94 %-100 %] 97 % (07/25 0504) FiO2 (%):  [99 %] 99 % (07/24 1240) Weight:  [81.647 kg (180 lb)] 81.647 kg (180 lb) (07/24 1240)  Intake/Output from previous day:  Intake/Output Summary (Last 24 hours) at 07/27/11 0754 Last data filed at 07/27/11 0505  Gross per 24 hour  Intake   2520 ml  Output   2200 ml  Net    320 ml    Intake/Output this shift:    Labs: No results found for this basename: HGB:5 in the last 72 hours No results found for this basename: WBC:2,RBC:2,HCT:2,PLT:2 in the last 72 hours No results found for this basename: NA:2,K:2,CL:2,CO2:2,BUN:2,CREATININE:2,GLUCOSE:2,CALCIUM:2 in the last 72 hours No results found for this basename: LABPT:2,INR:2 in the last 72 hours  EXAM: General - Patient is Alert, Appropriate and Oriented Extremity - Neurovascular intact Sensation intact distally Dorsiflexion/Plantar flexion intact Dressing - clean, dry, no drainage Motor Function - intact, moving foot and toes well on exam.   Assessment/Plan: 1 Day Post-Op Procedure(s) (LRB): EXCISION/RELEASE BURSA HIP (Left) Procedure(s) (LRB): EXCISION/RELEASE BURSA HIP (Left) Past Medical History  Diagnosis Date  . 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 Diabetes mellitus   . Arthritis     lower back   Principal Problem:  *Bursitis of hip   Discharge home Diet - Diabetic diet Follow up - in 7-8 days Activity - WBAT Disposition - Home Condition Upon Discharge - Good D/C  Meds - See DC Summary DVT Prophylaxis - Baby Aspirin for four weeks  Gerritt Galentine 07/27/2011, 7:54 AM

## 2011-07-28 ENCOUNTER — Encounter (HOSPITAL_COMMUNITY): Payer: Self-pay | Admitting: Orthopedic Surgery

## 2011-07-28 NOTE — OR Nursing (Signed)
Added procedure end time according to the Assistant Departure  Howell Rucks

## 2013-06-04 ENCOUNTER — Ambulatory Visit: Payer: Self-pay

## 2013-06-06 ENCOUNTER — Ambulatory Visit: Payer: Self-pay

## 2013-06-18 ENCOUNTER — Ambulatory Visit (INDEPENDENT_AMBULATORY_CARE_PROVIDER_SITE_OTHER): Payer: Medicare Other

## 2013-06-18 VITALS — BP 105/56 | HR 55 | Resp 12

## 2013-06-18 DIAGNOSIS — R52 Pain, unspecified: Secondary | ICD-10-CM

## 2013-06-18 DIAGNOSIS — M2010 Hallux valgus (acquired), unspecified foot: Secondary | ICD-10-CM

## 2013-06-18 DIAGNOSIS — M2040 Other hammer toe(s) (acquired), unspecified foot: Secondary | ICD-10-CM

## 2013-06-18 NOTE — Progress Notes (Signed)
   Subjective:    Patient ID: Rose Smith, female    DOB: 03/07/1940, 73 y.o.   MRN: [id number]  HPI PT STATED RT FOOT 2ND TOE IS CURVING IN AND BEEN HURTING FOR 6 MONTHS.  THE TOES IS GETTING WORSE. THE TOES GET AGGRAVATED BY WEARING SHOES. TRIED TO WEAR GOOD SHOES OR OPEN SHOES AND IT HELP SOME.   Review of Systems  All other systems reviewed and are negative.      Objective:   Physical Exam 73 year old female well-nourished well-developed oriented x3 presents with a complaint of painful hammertoe with rigid digital contractures second toe right foot has had previous surgery second third fourth toes left foot although there is some medial deviation at the MTP joints of left foot they seem to be rectus the second toe right foot has severe hammer contracture at the proximal IP joint with extension of the distal IP joint. Painful both on palpation range of motion with enclosed shoe wear at this time. Largely objective findings as follows neurovascular status is intact pedal pulses palpable DP +2/4 bilateral PT plus one over 4 bilateral capillary refill time 3 seconds all digits epicritic and proprioceptive sensations intact and symmetric bilateral is normal plantar response DTRs not listed dermatologically skin color pigment normal hair growth absent nails criptotic otherwise unremarkable orthopedic biomechanical exam rectus foot type x-rays confirm rigid digital contracture hammertoe deformity second digit right foot also mild HAV deformity which is asymptomatic and nonpainful       Assessment & Plan:  Assessment 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 At this time patient is requesting surgical intervention formal hammertoe repair with digital fusion of the second digit proximal IP joint with K wire fixation risk L. toes are reviewed consent form is reviewed  and signed and surgery scheduled at patient's convenience will followup appropriately 3 months postop will be a surgical shoe and K wire fixation for proxy 5-6 weeks postop. Surgery scheduled at this time  Harriet Masson DPM

## 2013-06-18 NOTE — Patient Instructions (Signed)
Pre-Operative Instructions  Congratulations, you have decided to take an important step to improving your quality of life.  You can be assured that the doctors of [facility] will be with you every step of the way.  1. Plan to be at the surgery center/hospital at least 1 (one) hour prior to your scheduled time unless otherwise directed by the surgical center/hospital staff.  You must have a responsible adult accompany you, remain during the surgery and drive you home.  Make sure you have directions to the surgical center/hospital and know how to get there on time. 2. For hospital based surgery you will need to obtain a history and physical form from your family physician within 1 month prior to the date of surgery- we will give you a form for you primary physician.  3. We make every effort to accommodate the date you request for surgery.  There are however, times where surgery dates or times have to be moved.  We will contact you as soon as possible if a change in schedule is required.   4. No Aspirin/Ibuprofen for one week before surgery.  If you are on aspirin, any non-steroidal anti-inflammatory medications (Mobic, Aleve, Ibuprofen) you should stop taking it 7 days prior to your surgery.  You make take Tylenol  For pain prior to surgery.  5. Medications- If you are taking daily heart and blood pressure medications, seizure, reflux, allergy, asthma, anxiety, pain or diabetes medications, make sure the surgery center/hospital is aware before the day of surgery so they may notify you which medications to take or avoid the day of surgery. 6. No food or drink after midnight the night before surgery unless directed otherwise by surgical center/hospital staff. 7. No alcoholic beverages 24 hours prior to surgery.  No smoking 24 hours prior to or 24 hours after surgery. 8. Wear loose pants or shorts- loose enough to fit over bandages, boots, and casts. 9. No slip on shoes, sneakers are best. 10. Bring  your boot with you to the surgery center/hospital.  Also bring crutches or a walker if your physician has prescribed it for you.  If you do not have this equipment, it will be provided for you after surgery. 11. If you have not been contracted by the surgery center/hospital by the day before your surgery, call to confirm the date and time of your surgery. 12. Leave-time from work may vary depending on the type of surgery you have.  Appropriate arrangements should be made prior to surgery with your employer. 13. Prescriptions will be provided immediately following surgery by your doctor.  Have these filled as soon as possible after surgery and take the medication as directed. 14. Remove nail polish on the operative foot. 15. Wash the night before surgery.  The night before surgery wash the foot and leg well with the antibacterial soap provided and water paying special attention to beneath the toenails and in between the toes.  Rinse thoroughly with water and dry well with a towel.  Perform this wash unless told not to do so by your physician.  Enclosed: 1 Ice pack (please put in freezer the night before surgery)   1 Hibiclens skin cleaner   Pre-op Instructions  If you have any questions regarding the instructions, do not hesitate to call our office.  [place]: [address] [place], [place] [address] [phone]  [place]: [address] [place], [place] [address] [phone]  [place]: [address]  [place], [place] [address] [phone]  Dr. Danajah Birdsell   Tuchman DPM, Dr. Norman Regal DPM Dr. Cera Rorke DPM, Dr. M. Todd Hyatt DPM, Dr. Kathryn Egerton DPM 

## 2013-06-23 ENCOUNTER — Telehealth: Payer: Self-pay | Admitting: *Deleted

## 2013-06-23 NOTE — Telephone Encounter (Signed)
Patient called wanting to schedule surgery for either 07/04/2013 or 07/11/2013, preferably the 3rd.  I told her I would have to call the surgical center to see if they were going to be open on the 3rd.

## 2013-06-23 NOTE — Telephone Encounter (Signed)
I called and informed the patient that we scheduled her for 07/04/2013, surgical center will be open.  I gave her a tentative time of 10:30, with 9:30am arrival.  I told her they may change it to an earlier time if no other cases are added.  She stated okay great.  She asked if they would call a day or two before with the exact time.  I told her yes.  She stated thank you.

## 2013-06-23 NOTE — Telephone Encounter (Signed)
I called the surgical center to see if they were going to be open on 07/04/2013.  Renee said they were going to be open.  She said we can do the case at 10:30am.

## 2013-06-24 ENCOUNTER — Telehealth: Payer: Self-pay | Admitting: *Deleted

## 2013-06-24 NOTE — Telephone Encounter (Signed)
I called and informed the patient that we have to move surgery from 07/04/2013 to 07/11/2013 because the surgical center can't open for just one case.  If they have any cases added, then we can put you back on that day.  She asked if we could do anything on 07/07/2013.  I told her Dr. Blenda Mounts is already full for that day.  She said okay let's do it for the 10th and let me know if I can do it sooner.  I told her I would.  I informed Dr. Blenda Mounts of the change as well as the surgical center.

## 2013-07-11 ENCOUNTER — Telehealth: Payer: Self-pay | Admitting: *Deleted

## 2013-07-11 DIAGNOSIS — M2040 Other hammer toe(s) (acquired), unspecified foot: Secondary | ICD-10-CM

## 2013-07-11 NOTE — Telephone Encounter (Signed)
Pt called and is having surgery today and is wanting her prescriptions sent over to [facility] before surgery.

## 2013-07-11 NOTE — Telephone Encounter (Signed)
Called and left message letting pt know that the prescription for after surgery will be given to her. Narcotics can not be called in or e scribed.

## 2013-07-16 NOTE — Progress Notes (Signed)
Dr Blenda Mounts performed a hammertoe repair right foot 2nd met on 07/11/13. Prescribed hydrocodone 5/325 #4- 1-2 tabs Q6hrs prn pain, clindamycin 173m #12 1 tab Q6hrs

## 2013-07-18 ENCOUNTER — Ambulatory Visit (INDEPENDENT_AMBULATORY_CARE_PROVIDER_SITE_OTHER): Payer: Medicare Other | Admitting: Podiatrist

## 2013-07-18 ENCOUNTER — Ambulatory Visit (INDEPENDENT_AMBULATORY_CARE_PROVIDER_SITE_OTHER): Payer: Medicare Other

## 2013-07-18 VITALS — BP 120/77 | HR 75 | Resp 16 | Ht 68.0 in | Wt 179.0 lb

## 2013-07-18 DIAGNOSIS — M2040 Other hammer toe(s) (acquired), unspecified foot: Secondary | ICD-10-CM

## 2013-07-18 DIAGNOSIS — Z9889 Other specified postprocedural states: Secondary | ICD-10-CM

## 2013-07-18 NOTE — Progress Notes (Signed)
Subjective: Patient presents today1 week status post foot surgery of the rightt foot.  Date of surgery 07-11-2013-  Hammertoe repair/fusion 2nd toe right with pin fixation. Patient denies nausea, vomiting, fevers, chills or night sweats.  Denies calf pain or tenderness to the operative side.  Objective:  Neurovascular status is intact with palpable pedal pulses DP and PT bilateral at 2+ out of 4. Neurological sensation is intact and unchanged as per prior to surgery. Excellent appearance of the postoperative foot is noted. Minimal swelling noted.  Sutures intact.  Pin in good placement.  xrays reveal well healing arthrodesis 2nd toe with pin fixation.  Assessment: Status post arthrodesis right 2nd toe  Plan:  Redressed the foot and a dry and sterile compressive dressing. She did ask if she could wear her normal sandals and I told her to continue wearing her Darco shoe until instructed otherwise by Dr. Blenda Mounts. She will be seen back for recheck and suture removal in one week. She is no longer taking the pain medication.

## 2013-07-25 ENCOUNTER — Other Ambulatory Visit: Payer: Medicare Other

## 2013-08-07 ENCOUNTER — Ambulatory Visit (INDEPENDENT_AMBULATORY_CARE_PROVIDER_SITE_OTHER): Payer: Medicare Other

## 2013-08-07 VITALS — BP 145/76 | HR 64 | Resp 18

## 2013-08-07 DIAGNOSIS — Z9889 Other specified postprocedural states: Secondary | ICD-10-CM

## 2013-08-07 DIAGNOSIS — M2040 Other hammer toe(s) (acquired), unspecified foot: Secondary | ICD-10-CM

## 2013-08-07 DIAGNOSIS — M2041 Other hammer toe(s) (acquired), right foot: Secondary | ICD-10-CM

## 2013-08-07 NOTE — Patient Instructions (Signed)
ICE INSTRUCTIONS  Apply ice or cold pack to the affected area at least 3 times a day for 10-15 minutes each time.  You should also use ice after prolonged activity or vigorous exercise.  Do not apply ice longer than 20 minutes at one time.  Always keep a cloth between your skin and the ice pack to prevent burns.  Being consistent and following these instructions will help control your symptoms.  We suggest you purchase a gel ice pack because they are reusable and do bit leak.  Some of them are designed to wrap around the area.  Use the method that works best for you.  Here are some other suggestions for icing.   Use a frozen bag of peas or corn-inexpensive and molds well to your body, usually stays frozen for 10 to 20 minutes.  Wet a towel with cold water and squeeze out the excess until it's damp.  Place in a bag in the freezer for 20 minutes. Then remove and use.  Maintain Coflex wrap in the toes, buddy wrap in the second third toes together to maintain position

## 2013-08-07 NOTE — Progress Notes (Signed)
° °  Subjective:    Patient ID: Rose Smith, female    DOB: [birth year], 73 y.o.   MRN: [id number]  HPI I AM HERE TO GET MY PIN OUT ON MY 2ND TOE ON THE LEFT FOOT AND PATIENT STATED THAT THE SUTURES CAME OUT 2 WEEKS AGO    Review of Systems no new findings or systemic changes    Objective:   Physical Exam Neurovascular status is intact pedal pulses are palpable epicritic and proprioceptive sensations intact. There is a K wire intact fixation second digit right foot incision clean dry well coapted. Patient is ambulating in a pair of sandals not a surgical shoe she was told by previous visits if she can maintain other shoes as long as it didn't irritate her toe work and digits she maintain a sandal such as this or [place] with a firm stiff soled. Patient this time is approxi-4 weeks status post hammertoe repair second digit right foot typically I allow 56 week before at pins K wires on the toe this apparently has been is scheduled as I was not seen the patient postoperatively she is being managed by other staff. Patient may reappointed for next week for followup x-ray and pin removal at a more appropriate time between 5 and 6 weeks postop.       Assessment & Plan:  Assessment good postop progress however too early to remove the pin at this time Coflex wrap in the toes applied and patient is dispensed Coflex to maintain wrapping of the toe buddy wrap the second and third toes maintain a stiff soled shoe reappointed one week plan for x-rays and pin removal removal  Harriet Masson DPM

## 2013-08-07 NOTE — Progress Notes (Signed)
   Subjective:    Patient ID: Rose Smith, female    DOB: 12/31/1940, 73 y.o.   MRN: [id number]  HPI  Pt presents for suture removal, sutures removed, wound edges aligned and closed and approximated. No drainage noted, mild edema noted. Dr Valentina Lucks advised to put padding on shoe to prevent toe from dropping, reappointed to f/u for next schedule visit   Review of Systems     Objective:   Physical Exam        Assessment & Plan:

## 2013-08-14 ENCOUNTER — Ambulatory Visit (INDEPENDENT_AMBULATORY_CARE_PROVIDER_SITE_OTHER): Payer: Medicare Other

## 2013-08-14 VITALS — BP 133/62 | HR 59 | Resp 18

## 2013-08-14 DIAGNOSIS — M2040 Other hammer toe(s) (acquired), unspecified foot: Secondary | ICD-10-CM

## 2013-08-14 DIAGNOSIS — M2041 Other hammer toe(s) (acquired), right foot: Secondary | ICD-10-CM

## 2013-08-14 DIAGNOSIS — Z09 Encounter for follow-up examination after completed treatment for conditions other than malignant neoplasm: Secondary | ICD-10-CM

## 2013-08-14 NOTE — Progress Notes (Addendum)
° °  Subjective:    Patient ID: Rose Smith, female    DOB: Oct 02, 1940, 73 y.o.   MRN: [id number]  HPI I HAD SURGERY ON 07/11/13 ON MY RIGHT FOOT AND TODAY I AM GOING TO GET MY PIN OUT    Review of Systems no new findings or systemic changes noted     Objective:   Physical Exam Neurovascular status is intact pedal pulses are palpable incision well coapted pin fixation second toe right foot is intact nondisplaced x-ray revealed good position and consolidation occurring at the IP joint. Patient is just over 5 weeks status post hammertoe repair and is in adequate time for pin removal at this point.       Assessment & Plan:  Assessment good postop progress the toes prepped with Betadine ointment and the patient is carefully retrograded from the site and removed in toto examined and Band-Aid dressing and Coflex buddy wrap of the second and third toes is carried out at this time. Patient will maintain Coflex wrap with 2 rolls been dispensed for patient use for the next couple of weeks. May resume normal bathing and hygiene maintain comfortable accommodative thick soled shoes at all times. No barefoot behind the ulcer dress shoes. Reappointed 2 months for long-term followup x-ray next  Harriet Masson DPM

## 2013-08-14 NOTE — Patient Instructions (Signed)
ICE INSTRUCTIONS  Apply ice or cold pack to the affected area at least 3 times a day for 10-15 minutes each time.  You should also use ice after prolonged activity or vigorous exercise.  Do not apply ice longer than 20 minutes at one time.  Always keep a cloth between your skin and the ice pack to prevent burns.  Being consistent and following these instructions will help control your symptoms.  We suggest you purchase a gel ice pack because they are reusable and do bit leak.  Some of them are designed to wrap around the area.  Use the method that works best for you.  Here are some other suggestions for icing.   Use a frozen bag of peas or corn-inexpensive and molds well to your body, usually stays frozen for 10 to 20 minutes.  Wet a towel with cold water and squeeze out the excess until it's damp.  Place in a bag in the freezer for 20 minutes. Then remove and use.  Maintain Coflex buddy wrap of toes for the next several weeks to maintain stability of the toe.

## 2013-09-01 ENCOUNTER — Telehealth: Payer: Self-pay | Admitting: *Deleted

## 2013-09-01 NOTE — Telephone Encounter (Signed)
I had surgery on 07/11/2013 on my second left toe.  It had a pin in for 5 weeks.  I been wrapping the toe every since the pin was removed.  I'm noticing the very end of my toe out to the joint of my toe is very loose, I can't curl it.  It's flopping around.  I didn't know if that was normal or not.  I want to check with someone to see if this is typical, take time to set or not.  Please give me a call.

## 2013-09-02 NOTE — Telephone Encounter (Signed)
I called and informed her that unfortunately this occurs.  I told her sometimes the tissue will develop in between it and make it better.  She asked if she needs to keep it dressed.  I told her no.  She stated well I am going to continue because he told me to do it to keep it down and keep it from catching onto things.  I told her okay.  She asked when her next appointment is I told her 10/10/2013 at 8:30am.  She stated okay, for some reason I had another date written down.  Thank you.

## 2013-10-10 ENCOUNTER — Ambulatory Visit (INDEPENDENT_AMBULATORY_CARE_PROVIDER_SITE_OTHER): Payer: Medicare Other

## 2013-10-10 VITALS — BP 133/69 | HR 86 | Resp 12

## 2013-10-10 DIAGNOSIS — Z09 Encounter for follow-up examination after completed treatment for conditions other than malignant neoplasm: Secondary | ICD-10-CM

## 2013-10-10 DIAGNOSIS — M2041 Other hammer toe(s) (acquired), right foot: Secondary | ICD-10-CM

## 2013-10-10 DIAGNOSIS — M2011 Hallux valgus (acquired), right foot: Secondary | ICD-10-CM

## 2013-10-10 NOTE — Progress Notes (Signed)
   Subjective:    Patient ID: Rose Smith, female    DOB: [birth year], 73 y.o.   MRN: [id number]  HPI  ''DOS 07/11/13 RT FOOT IS DOING OK.''  Review of Systems no new findings or systemic changes noted     Objective:   Physical Exam Neurovascular status is intact patient is 3 months status post hammertoe repair second digit right foot. There still some mild edema of the toe the digit. He relatively rectus with a plantigrade position she can buddy wrap in as instructed should note neurovascular status is intact pedal pulses are palpable x-rays confirm consolidation of the IP joint with the consolidation or arthrosis of the joint almost completed at this point on clinical evaluation at the IP joint assessment good postop progress. No pain or discomfort incision is well coapted. Remainder of exam otherwise unremarkable       Assessment & Plan:  Assessment good postop progress 3 months status post hammertoe repair second right. Patient is given some physio- tape to splints the toes in a plantar-grade position as she is she is given a roll tape he continues any the right or left second toe is still have a tendency to dorsally contracted at the MTP joint. The toe strap her sling is applied to the second toe patient is an instructed in how to apply this and will continue to do so. Discharge to an as-needed basis for any future followup ulcers new problems or difficulties or exacerbations over the next several months maintain good stable shoes swelling should continue dissipate over the next 3 months. Next  Harriet Masson DPM

## 2013-10-10 NOTE — Patient Instructions (Signed)

## 2014-12-21 ENCOUNTER — Encounter: Payer: Self-pay | Admitting: Internal Medicine

## 2015-02-02 ENCOUNTER — Ambulatory Visit: Payer: Self-pay | Admitting: Internal Medicine

## 2015-09-02 ENCOUNTER — Emergency Department (HOSPITAL_COMMUNITY): Payer: Medicare Other

## 2015-09-02 ENCOUNTER — Emergency Department (HOSPITAL_COMMUNITY)
Admission: EM | Admit: 2015-09-02 | Discharge: 2015-09-02 | Disposition: A | Discharge status: Home / Self Care | Payer: Medicare Other | Attending: Emergency Medicine | Admitting: Emergency Medicine

## 2015-09-02 ENCOUNTER — Encounter (HOSPITAL_COMMUNITY): Payer: Self-pay | Admitting: Emergency Medicine

## 2015-09-02 DIAGNOSIS — Z23 Encounter for immunization: Secondary | ICD-10-CM | POA: Diagnosis not present

## 2015-09-02 DIAGNOSIS — Z79899 Other long term (current) drug therapy: Secondary | ICD-10-CM | POA: Insufficient documentation

## 2015-09-02 DIAGNOSIS — Z87891 Personal history of nicotine dependence: Secondary | ICD-10-CM | POA: Insufficient documentation

## 2015-09-02 DIAGNOSIS — Y9234 Swimming pool (public) as the place of occurrence of the external cause: Secondary | ICD-10-CM | POA: Insufficient documentation

## 2015-09-02 DIAGNOSIS — S0083XA Contusion of other part of head, initial encounter: Secondary | ICD-10-CM | POA: Diagnosis not present

## 2015-09-02 DIAGNOSIS — S80211A Abrasion, right knee, initial encounter: Secondary | ICD-10-CM | POA: Diagnosis not present

## 2015-09-02 DIAGNOSIS — S0993XA Unspecified injury of face, initial encounter: Secondary | ICD-10-CM | POA: Diagnosis present

## 2015-09-02 DIAGNOSIS — Y999 Unspecified external cause status: Secondary | ICD-10-CM | POA: Insufficient documentation

## 2015-09-02 DIAGNOSIS — W01119A Fall on same level from slipping, tripping and stumbling with subsequent striking against unspecified sharp object, initial encounter: Secondary | ICD-10-CM | POA: Diagnosis not present

## 2015-09-02 DIAGNOSIS — Y939 Activity, unspecified: Secondary | ICD-10-CM | POA: Insufficient documentation

## 2015-09-02 DIAGNOSIS — Z7982 Long term (current) use of aspirin: Secondary | ICD-10-CM | POA: Diagnosis not present

## 2015-09-02 DIAGNOSIS — W19XXXA Unspecified fall, initial encounter: Secondary | ICD-10-CM

## 2015-09-02 DIAGNOSIS — S022XXA Fracture of nasal bones, initial encounter for closed fracture: Secondary | ICD-10-CM

## 2015-09-02 DIAGNOSIS — E119 Type 2 diabetes mellitus without complications: Secondary | ICD-10-CM | POA: Diagnosis not present

## 2015-09-02 DIAGNOSIS — Z7984 Long term (current) use of oral hypoglycemic drugs: Secondary | ICD-10-CM | POA: Insufficient documentation

## 2015-09-02 MED ORDER — OXYCODONE-ACETAMINOPHEN 5-325 MG PO TABS: 1.0000 | ORAL_TABLET | Freq: Four times a day (QID) | ORAL | 0 refills | Status: DC | PRN

## 2015-09-02 MED ORDER — IBUPROFEN 200 MG PO TABS
600.0000 mg | ORAL_TABLET | Freq: Once | ORAL | Status: AC
  Administered 2015-09-02: 600 mg via ORAL
  Filled 2015-09-02: qty 3

## 2015-09-02 MED ORDER — TETANUS-DIPHTH-ACELL PERTUSSIS 5-2.5-18.5 LF-MCG/0.5 IM SUSP
0.5000 mL | Freq: Once | INTRAMUSCULAR | Status: AC
  Administered 2015-09-02: 0.5 mL via INTRAMUSCULAR
  Filled 2015-09-02: qty 0.5

## 2015-09-02 MED ORDER — IBUPROFEN 600 MG PO TABS: 600.0000 mg | ORAL_TABLET | Freq: Four times a day (QID) | ORAL | 0 refills | Status: DC | PRN

## 2015-09-02 NOTE — ED Triage Notes (Signed)
Pt here from [facility] via [employer]. Pt reports tripping over a mat and falling and hitting the right side of her forehead. Pt denies LOC, denies use of anticoagulant. Pt complaining of HA and scrape to knee. Denies dizziness. A&O x4.

## 2015-09-02 NOTE — ED Provider Notes (Signed)
[facility] DEPT Provider Note   CSN: [id number] Arrival date & time: 09/02/15  1344     History   Chief Complaint Chief Complaint  Patient presents with  . Fall    HPI Rose Smith is a 75 y.o. female.  Pt fell pta at the [facility] while getting out of the pool.  The pt said that she tripped over a mat.  She did not have a loc.  She is not on anticoagulants.  The pt has a h/a and has pain to right forehead/face and nose.  She did scrape her right knee, but it does not really hurt.  She does not want anything for pain at this time.      Past Medical History:  Diagnosis Date  . Arthritis    lower back  . Diabetes mellitus   . 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 Bursitis of hip 07/26/2011    Past Surgical History:  Procedure Laterality Date  . ABDOMINAL HYSTERECTOMY    . CARPAL TUNNEL RELEASE     left   . EXCISION/RELEASE BURSA HIP  07/26/2011   Procedure: EXCISION/RELEASE BURSA HIP;  Surgeon: Gearlean Alf, MD;  Location: [facility] ORS;  Service: Orthopedics;  Laterality: Left;  Left Hip Bursectomy with Gluteal Tendon Repair  . ROTATOR CUFF REPAIR     bilateral   . vaginal wall repair       OB History    No data available       Home Medications    Prior to Admission medications   Medication Sig Start Date End Date Taking? Authorizing Provider  Amantadine HCl POWD  07/03/13   Historical Provider, MD  aspirin EC 81 MG tablet Take 81 mg by mouth daily.    Historical Provider, MD  B Complex-C (B-COMPLEX WITH VITAMIN C) tablet Take 1 tablet by mouth daily.    Historical Provider, MD  Calcium Carbonate-Vit D-Min (CALCIUM 1200 PO) Take 1 tablet by mouth daily.    Historical Provider, MD  Cholecalciferol (VITAMIN D) 1000 UNITS capsule Take 5,000 Units by mouth.    Historical Provider, MD  CINNAMON PO Take 1 tablet by mouth daily.    Historical Provider, MD  clindamycin (CLEOCIN) 150 MG capsule   07/11/13   Historical Provider, MD  diclofenac (VOLTAREN) 75 MG EC tablet Take 75 mg by mouth daily.    Historical Provider, MD  fish oil-omega-3 fatty acids 1000 MG capsule Take 1,200 mg by mouth daily.    Historical Provider, MD  fluticasone (FLONASE) 50 MCG/ACT nasal spray Place 1 spray into the nose. 01/16/12   Historical Provider, MD  gemfibrozil (LOPID) 600 MG tablet  05/03/13   Historical Provider, MD  Glucomannan (GLUCOMANNAN KONJAC ROOT) 1000 MG CAPS Take 1 capsule by mouth daily.    Historical Provider, MD  HYDROcodone-acetaminophen (NORCO/VICODIN) 5-325 MG per tablet  07/11/13   Historical Provider, MD  ibuprofen (ADVIL,MOTRIN) 600 MG tablet Take 1 tablet (600 mg total) by mouth every 6 (six) hours as needed. 09/02/15   Isla Pence, MD  LevOCARNitine (L-CARNITINE PO) Take 1 tablet by mouth daily.    Historical Provider, MD  Linoleic Acid Conjugated (CLA PO) Take 1,500 mg by mouth daily.    Historical Provider, MD  Lutein 20 MG CAPS Take 1 tablet by mouth.    Historical Provider, MD  metformin (FORTAMET) 500 MG (OSM) 24 hr  tablet Take 1,000 mg by mouth every evening.    Historical Provider, MD  metFORMIN (GLUCOPHAGE-XR) 500 MG 24 hr tablet TAKE 1 TABLET (500 MG TOTAL) BY MOUTH 2 (TWO) TIMES DAILY BEFORE MEALS. 05/07/13   Historical Provider, MD  methocarbamol (ROBAXIN) 500 MG tablet  04/09/13   Historical Provider, MD  multivitamin-lutein (OCUVITE-LUTEIN) CAPS Take 1 capsule by mouth daily.    Historical Provider, MD  Omega-3 Fatty Acids (FISH OIL) 1200 MG CPDR Take 1 capsule by mouth.    Historical Provider, MD  omeprazole (PRILOSEC) 20 MG capsule  06/11/13   Historical Provider, MD  oxyCODONE-acetaminophen (PERCOCET/ROXICET) 5-325 MG tablet Take 1 tablet by mouth every 6 (six) hours as needed for severe pain. 09/02/15   Isla Pence, MD  pravastatin (PRAVACHOL) 20 MG tablet Use 1/2 tablet daily. 03/24/13 03/24/17  Historical Provider, MD  pravastatin (PRAVACHOL) 40 MG tablet Take 40 mg by mouth  daily.    Historical Provider, MD  pregabalin (LYRICA) 75 MG capsule Take 75 mg by mouth daily.    Historical Provider, MD  PRESCRIPTION MEDICATION Apply 1 application topically every morning. Compound=Ketoprofen,Guaifenesin,Amitriptyline,Dph cream. Made by [employer] [phone]    Historical Provider, MD  tolterodine (DETROL LA) 4 MG 24 hr capsule  04/02/13   Historical Provider, MD  tretinoin (RETIN-A) 0.025 % cream Apply topically. 03/25/12   Historical Provider, MD  UNABLE TO FIND Apply topically.    Historical Provider, MD  Vitamin D-Vitamin K (D3 + K2 DOTS) 1000-90 UNIT-MCG TABS Take 1 tablet by mouth daily.    Historical Provider, MD  VITAMIN E PO Take 1,200 mg by mouth daily.    Historical Provider, MD  Vitamin K, Phytonadione, 100 MCG TABS Take 1 tablet by mouth.    Historical Provider, MD    Family History No family history on file.  Social History Social History  Substance Use Topics  . Smoking status: Former Smoker    Quit date: 01/02/1998  . Smokeless tobacco: Never Used  . Alcohol use Yes     Comment: occasional      Allergies   Penicillins   Review of Systems Review of Systems  HENT:       Nose pain  Skin: Positive for wound.       Right knee abrasion  Neurological: Positive for headaches.  All other systems reviewed and are negative.    Physical Exam Updated Vital Signs BP 170/73 (BP Location: Right Arm)   Pulse (!) 52   Temp 97.8 F (36.6 C) (Oral)   Resp 16   Ht 5\' 7"  (1.702 m)   Wt 152 lb (68.9 kg)   SpO2 100%   BMI 23.81 kg/m   Physical Exam  Constitutional: She is oriented to person, place, and time. She appears well-developed and well-nourished.  HENT:  Head: Normocephalic.    Right Ear: External ear normal.  Left Ear: External ear normal.  Nose: Sinus tenderness and nasal deformity present. No nasal septal hematoma. No epistaxis.    Mouth/Throat: Oropharynx is clear and moist.  Eyes: Conjunctivae and EOM are normal. Pupils are  equal, round, and reactive to light.  Neck: Normal range of motion. Neck supple.  Cardiovascular: Normal rate, regular rhythm, normal heart sounds and intact distal pulses.   Pulmonary/Chest: Effort normal and breath sounds normal.  Abdominal: Soft. Bowel sounds are normal.  Musculoskeletal:  Abrasion to right knee.  Full rom.  No trouble with walking.  Neurological: She is alert and oriented to person, place,  and time.  Skin: Skin is warm and dry.  Psychiatric: She has a normal mood and affect. Her behavior is normal. Judgment and thought content normal.  Nursing note and vitals reviewed.    ED Treatments / Results  Labs (all labs ordered are listed, but only abnormal results are displayed) Labs Reviewed - No data to display  EKG  EKG Interpretation None       Radiology Ct Head Wo Contrast  Result Date: 09/02/2015 CLINICAL DATA:  75 year old female tripped and fell striking right head. Headache. Initial encounter. EXAM: CT HEAD WITHOUT CONTRAST CT MAXILLOFACIAL WITHOUT CONTRAST TECHNIQUE: Multidetector CT imaging of the head and maxillofacial structures were performed using the standard protocol without intravenous contrast. Multiplanar CT image reconstructions of the maxillofacial structures were also generated. COMPARISON:  Head CT without contrast and cervical spine radiographs 10/01/2007. FINDINGS: CT HEAD FINDINGS Right forehead scalp hematoma continuing anterior to the right orbit. See face findings below. Underlying right frontal bone appears intact. Other scalp soft tissues are within normal limits. No calvarium fracture identified. Tympanic cavities and mastoids remain clear. Calcified atherosclerosis at the skull base. Since 2009 patchy bilateral cerebral white matter hypodensity has mildly progressed. Cerebral volume remains normal for age. No ventriculomegaly. No midline shift, mass effect, or evidence of intracranial mass lesion. No cortically based acute infarct  identified. No acute intracranial hemorrhage identified. CT MAXILLOFACIAL FINDINGS Negative visualized noncontrast larynx, pharynx, parapharyngeal spaces, retropharyngeal space, sublingual space, submandibular glands, and parotid glands. Globes are intact. No intraorbital soft tissue injury identified. Right periorbital and forehead hematoma measuring up to 10 mm in thickness tracking toward the right pre malar soft tissues. Associated mildly comminuted and displaced bilateral nasal bone fractures. Overlying nasal soft tissue swelling with small volume subcutaneous gas. Paranasal sinuses remain clear. No right orbital fracture identified. Mandible intact. No maxilla or zygoma fracture. No central skullbase fracture identified. Visible cervical spine appears intact with degenerative changes. IMPRESSION: 1. Superficial right forehead and periorbital hematoma. Underlying comminuted nasal bone fractures with soft tissue swelling and trace nasal subcutaneous gas. 2. No other acute traumatic injury identified in the head or face. 3. No acute intracranial abnormality. Mild for age nonspecific white matter changes. Electronically Signed   By: Genevie Ann M.D.   On: 09/02/2015 15:13   Ct Maxillofacial Wo Contrast  Result Date: 09/02/2015 CLINICAL DATA:  75 year old female tripped and fell striking right head. Headache. Initial encounter. EXAM: CT HEAD WITHOUT CONTRAST CT MAXILLOFACIAL WITHOUT CONTRAST TECHNIQUE: Multidetector CT imaging of the head and maxillofacial structures were performed using the standard protocol without intravenous contrast. Multiplanar CT image reconstructions of the maxillofacial structures were also generated. COMPARISON:  Head CT without contrast and cervical spine radiographs 10/01/2007. FINDINGS: CT HEAD FINDINGS Right forehead scalp hematoma continuing anterior to the right orbit. See face findings below. Underlying right frontal bone appears intact. Other scalp soft tissues are within normal  limits. No calvarium fracture identified. Tympanic cavities and mastoids remain clear. Calcified atherosclerosis at the skull base. Since 2009 patchy bilateral cerebral white matter hypodensity has mildly progressed. Cerebral volume remains normal for age. No ventriculomegaly. No midline shift, mass effect, or evidence of intracranial mass lesion. No cortically based acute infarct identified. No acute intracranial hemorrhage identified. CT MAXILLOFACIAL FINDINGS Negative visualized noncontrast larynx, pharynx, parapharyngeal spaces, retropharyngeal space, sublingual space, submandibular glands, and parotid glands. Globes are intact. No intraorbital soft tissue injury identified. Right periorbital and forehead hematoma measuring up to 10 mm in thickness tracking toward the right pre  malar soft tissues. Associated mildly comminuted and displaced bilateral nasal bone fractures. Overlying nasal soft tissue swelling with small volume subcutaneous gas. Paranasal sinuses remain clear. No right orbital fracture identified. Mandible intact. No maxilla or zygoma fracture. No central skullbase fracture identified. Visible cervical spine appears intact with degenerative changes. IMPRESSION: 1. Superficial right forehead and periorbital hematoma. Underlying comminuted nasal bone fractures with soft tissue swelling and trace nasal subcutaneous gas. 2. No other acute traumatic injury identified in the head or face. 3. No acute intracranial abnormality. Mild for age nonspecific white matter changes. Electronically Signed   By: Genevie Ann M.D.   On: 09/02/2015 15:13    Procedures Procedures (including critical care time)  Medications Ordered in ED Medications  ibuprofen (ADVIL,MOTRIN) tablet 600 mg (not administered)  Tdap (BOOSTRIX) injection 0.5 mL (0.5 mLs Intramuscular Given 09/02/15 1432)     Initial Impression / Assessment and Plan / ED Course  I have reviewed the triage vital signs and the nursing  notes.  Pertinent labs & imaging results that were available during my care of the patient were reviewed by me and considered in my medical decision making (see chart for details).  Clinical Course    Pt requested some ibuprofen here before d/c.  She didn't want a narcotic here.  She is given the number to ENT.  She knows to return if worse.  Final Clinical Impressions(s) / ED Diagnoses   Final diagnoses:  Fall, initial encounter  Facial hematoma, initial encounter  Nasal fracture, closed, initial encounter    New Prescriptions New Prescriptions   IBUPROFEN (ADVIL,MOTRIN) 600 MG TABLET    Take 1 tablet (600 mg total) by mouth every 6 (six) hours as needed.   OXYCODONE-ACETAMINOPHEN (PERCOCET/ROXICET) 5-325 MG TABLET    Take 1 tablet by mouth every 6 (six) hours as needed for severe pain.     Isla Pence, MD 09/02/15 [phone]

## 2016-11-21 ENCOUNTER — Other Ambulatory Visit: Payer: Self-pay | Admitting: Gastroenterology

## 2016-11-21 DIAGNOSIS — R1013 Epigastric pain: Secondary | ICD-10-CM

## 2016-12-05 ENCOUNTER — Ambulatory Visit
Admission: RE | Admit: 2016-12-05 | Discharge: 2016-12-05 | Disposition: A | Discharge status: Home / Self Care | Payer: Medicare Other | Source: Ambulatory Visit | Attending: Gastroenterology | Admitting: Gastroenterology

## 2016-12-05 ENCOUNTER — Other Ambulatory Visit: Payer: Medicare Other

## 2016-12-05 DIAGNOSIS — R1013 Epigastric pain: Secondary | ICD-10-CM

## 2016-12-08 ENCOUNTER — Other Ambulatory Visit: Payer: Self-pay | Admitting: Gastroenterology

## 2016-12-08 DIAGNOSIS — R1013 Epigastric pain: Secondary | ICD-10-CM

## 2016-12-12 ENCOUNTER — Other Ambulatory Visit: Payer: Medicare Other

## 2016-12-18 ENCOUNTER — Ambulatory Visit
Admission: RE | Admit: 2016-12-18 | Discharge: 2016-12-18 | Disposition: A | Discharge status: Home / Self Care | Payer: Medicare Other | Source: Ambulatory Visit | Attending: Gastroenterology | Admitting: Gastroenterology

## 2016-12-18 DIAGNOSIS — R1013 Epigastric pain: Secondary | ICD-10-CM

## 2016-12-18 MED ORDER — IOPAMIDOL (ISOVUE-300) INJECTION 61%
100.0000 mL | Freq: Once | INTRAVENOUS | Status: AC | PRN
Start: 2016-12-18 — End: 2016-12-18
  Administered 2016-12-18: 100 mL via INTRAVENOUS

## 2017-01-09 DIAGNOSIS — H401131 Primary open-angle glaucoma, bilateral, mild stage: Secondary | ICD-10-CM | POA: Diagnosis not present

## 2017-01-11 DIAGNOSIS — E785 Hyperlipidemia, unspecified: Secondary | ICD-10-CM | POA: Diagnosis not present

## 2017-01-11 DIAGNOSIS — N183 Chronic kidney disease, stage 3 (moderate): Secondary | ICD-10-CM | POA: Diagnosis not present

## 2017-01-11 DIAGNOSIS — E1121 Type 2 diabetes mellitus with diabetic nephropathy: Secondary | ICD-10-CM | POA: Diagnosis not present

## 2017-01-11 DIAGNOSIS — D649 Anemia, unspecified: Secondary | ICD-10-CM | POA: Diagnosis not present

## 2017-01-12 ENCOUNTER — Other Ambulatory Visit: Payer: Self-pay | Admitting: Surgery

## 2017-01-12 DIAGNOSIS — K8020 Calculus of gallbladder without cholecystitis without obstruction: Secondary | ICD-10-CM | POA: Diagnosis not present

## 2017-01-13 DIAGNOSIS — Z0101 Encounter for examination of eyes and vision with abnormal findings: Secondary | ICD-10-CM | POA: Diagnosis not present

## 2017-01-18 DIAGNOSIS — H9313 Tinnitus, bilateral: Secondary | ICD-10-CM | POA: Diagnosis not present

## 2017-01-18 DIAGNOSIS — H903 Sensorineural hearing loss, bilateral: Secondary | ICD-10-CM | POA: Diagnosis not present

## 2017-01-29 DIAGNOSIS — R69 Illness, unspecified: Secondary | ICD-10-CM | POA: Diagnosis not present

## 2017-02-02 DIAGNOSIS — M47816 Spondylosis without myelopathy or radiculopathy, lumbar region: Secondary | ICD-10-CM | POA: Diagnosis not present

## 2017-02-08 ENCOUNTER — Ambulatory Visit: Admit: 2017-02-08 | Payer: Medicare Other | Admitting: Surgery

## 2017-02-08 DIAGNOSIS — M47816 Spondylosis without myelopathy or radiculopathy, lumbar region: Secondary | ICD-10-CM | POA: Diagnosis not present

## 2017-02-08 SURGERY — LAPAROSCOPIC CHOLECYSTECTOMY
Anesthesia: General

## 2017-02-21 DIAGNOSIS — K253 Acute gastric ulcer without hemorrhage or perforation: Secondary | ICD-10-CM | POA: Diagnosis not present

## 2017-02-21 DIAGNOSIS — K5900 Constipation, unspecified: Secondary | ICD-10-CM | POA: Diagnosis not present

## 2017-02-21 DIAGNOSIS — R198 Other specified symptoms and signs involving the digestive system and abdomen: Secondary | ICD-10-CM | POA: Diagnosis not present

## 2017-02-21 DIAGNOSIS — K8020 Calculus of gallbladder without cholecystitis without obstruction: Secondary | ICD-10-CM | POA: Diagnosis not present

## 2017-02-21 DIAGNOSIS — E1121 Type 2 diabetes mellitus with diabetic nephropathy: Secondary | ICD-10-CM | POA: Diagnosis not present

## 2017-02-21 DIAGNOSIS — M797 Fibromyalgia: Secondary | ICD-10-CM | POA: Diagnosis not present

## 2017-02-21 DIAGNOSIS — Z7984 Long term (current) use of oral hypoglycemic drugs: Secondary | ICD-10-CM | POA: Diagnosis not present

## 2017-02-27 DIAGNOSIS — L218 Other seborrheic dermatitis: Secondary | ICD-10-CM | POA: Diagnosis not present

## 2017-02-27 DIAGNOSIS — D485 Neoplasm of uncertain behavior of skin: Secondary | ICD-10-CM | POA: Diagnosis not present

## 2017-02-27 DIAGNOSIS — L720 Epidermal cyst: Secondary | ICD-10-CM | POA: Diagnosis not present

## 2017-03-08 ENCOUNTER — Encounter (INDEPENDENT_AMBULATORY_CARE_PROVIDER_SITE_OTHER): Payer: Self-pay

## 2017-03-08 ENCOUNTER — Other Ambulatory Visit: Payer: Self-pay

## 2017-03-08 ENCOUNTER — Encounter (HOSPITAL_COMMUNITY)
Admission: RE | Admit: 2017-03-08 | Discharge: 2017-03-08 | Disposition: A | Discharge status: Home / Self Care | Payer: Medicare HMO | Source: Ambulatory Visit | Attending: Surgery | Admitting: Surgery

## 2017-03-08 ENCOUNTER — Encounter (HOSPITAL_COMMUNITY): Payer: Self-pay

## 2017-03-08 DIAGNOSIS — K8080 Other cholelithiasis without obstruction: Secondary | ICD-10-CM | POA: Insufficient documentation

## 2017-03-08 DIAGNOSIS — E119 Type 2 diabetes mellitus without complications: Secondary | ICD-10-CM | POA: Diagnosis not present

## 2017-03-08 DIAGNOSIS — Z01810 Encounter for preprocedural cardiovascular examination: Secondary | ICD-10-CM | POA: Diagnosis not present

## 2017-03-08 DIAGNOSIS — I517 Cardiomegaly: Secondary | ICD-10-CM | POA: Insufficient documentation

## 2017-03-08 DIAGNOSIS — R001 Bradycardia, unspecified: Secondary | ICD-10-CM | POA: Insufficient documentation

## 2017-03-08 DIAGNOSIS — Z01812 Encounter for preprocedural laboratory examination: Secondary | ICD-10-CM | POA: Insufficient documentation

## 2017-03-08 DIAGNOSIS — R9431 Abnormal electrocardiogram [ECG] [EKG]: Secondary | ICD-10-CM | POA: Insufficient documentation

## 2017-03-08 LAB — CBC
HCT: 36.5 % (ref 36.0–46.0)
Hemoglobin: 11.8 g/dL — ABNORMAL LOW (ref 12.0–15.0)
MCH: 28.2 pg (ref 26.0–34.0)
MCHC: 32.3 g/dL (ref 30.0–36.0)
MCV: 87.1 fL (ref 78.0–100.0)
Platelets: 383 10*3/uL (ref 150–400)
RBC: 4.19 MIL/uL (ref 3.87–5.11)
RDW: 15.1 % (ref 11.5–15.5)
WBC: 5.9 10*3/uL (ref 4.0–10.5)

## 2017-03-08 LAB — BASIC METABOLIC PANEL
ANION GAP: 9 (ref 5–15)
BUN: 28 mg/dL — ABNORMAL HIGH (ref 6–20)
CALCIUM: 9.1 mg/dL (ref 8.9–10.3)
CO2: 24 mmol/L (ref 22–32)
Chloride: 108 mmol/L (ref 101–111)
Creatinine, Ser: 0.95 mg/dL (ref 0.44–1.00)
GFR calc Af Amer: >60 mL/min (ref >60)
GFR, EST NON AFRICAN AMERICAN: 56 mL/min — AB (ref >60)
GLUCOSE: 139 mg/dL — AB (ref 65–99)
Potassium: 3.9 mmol/L (ref 3.5–5.1)
Sodium: 141 mmol/L (ref 135–145)

## 2017-03-08 LAB — GLUCOSE, CAPILLARY: Glucose-Capillary: 139 mg/dL — ABNORMAL HIGH (ref 65–99)

## 2017-03-08 NOTE — Patient Instructions (Addendum)
Rose Smith  03/08/2017   Your procedure is scheduled on: 03-14-17  Report to [facility] Main  Entrance Report to Admitting at 6:30 AM    Call this number if you have problems the morning of surgery [phone]   Remember: Do not eat food or drink liquids :After Midnight.     Take these medicines the morning of surgery with A SIP OF WATER: Omeprazole (Prilosec). You may also bring and use your eyedrops as needed. DO NOT TAKE ANY DIABETIC MEDICATIONS DAY OF YOUR SURGERY                               You may not have any metal on your body including hair pins and              piercings  Do not wear jewelry, make-up, lotions, powders or perfumes, deodorant             Do not wear nail polish.  Do not shave  48 hours prior to surgery.                Do not bring valuables to the hospital. [place].  Contacts, dentures or bridgework may not be worn into surgery.      Patients discharged the day of surgery will not be allowed to drive home.  Name and phone number of your driver: Rose Smith [phone]                Please read over the following fact sheets you were given: _____________________________________________________________________ How to Manage Your Diabetes Before and After Surgery  Why is it important to control my blood sugar before and after surgery? . Improving blood sugar levels before and after surgery helps healing and can limit problems. . A way of improving blood sugar control is eating a healthy diet by: o  Eating less sugar and carbohydrates o  Increasing activity/exercise o  Talking with your doctor about reaching your blood sugar goals . High blood sugars (greater than 180 mg/dL) can raise your risk of infections and slow your recovery, so you will need to focus on controlling your diabetes during the weeks before surgery. . Make sure that the doctor who takes care of your  diabetes knows about your planned surgery including the date and location.  How do I manage my blood sugar before surgery? . Check your blood sugar at least 4 times a day, starting 2 days before surgery, to make sure that the level is not too high or low. o Check your blood sugar the morning of your surgery when you wake up and every 2 hours until you get to the Short Stay unit. . If your blood sugar is less than 70 mg/dL, you will need to treat for low blood sugar: o Do not take insulin. o Treat a low blood sugar (less than 70 mg/dL) with  cup of clear juice (cranberry or apple), 4 glucose tablets, OR glucose gel. o Recheck blood sugar in 15 minutes after treatment (to make sure it is greater than 70 mg/dL). If your blood sugar is not greater than 70 mg/dL on recheck, call [phone] for further instructions. . Report your blood sugar to the short stay nurse  when you get to Short Stay.  . If you are admitted to the hospital after surgery: o Your blood sugar will be checked by the staff and you will probably be given insulin after surgery (instead of oral diabetes medicines) to make sure you have good blood sugar levels. o The goal for blood sugar control after surgery is 80-180 mg/dL.   WHAT DO I DO ABOUT MY DIABETES MEDICATION?  Marland Kitchen Do not take oral diabetes medicines (pills) the morning of surgery.  . THE DAY BEFORE SURGERY, take your usual dose of Metformin        Reviewed and Endorsed by [facility] Patient Education Committee, August 2015            [facility] - Preparing for Surgery Before surgery, you can play an important role.  Because skin is not sterile, your skin needs to be as free of germs as possible.  You can reduce the number of germs on your skin by washing with CHG (chlorahexidine gluconate) soap before surgery.  CHG is an antiseptic cleaner which kills germs and bonds with the skin to continue killing germs even after washing. Please DO NOT use if you have an  allergy to CHG or antibacterial soaps.  If your skin becomes reddened/irritated stop using the CHG and inform your nurse when you arrive at Short Stay. Do not shave (including legs and underarms) for at least 48 hours prior to the first CHG shower.  You may shave your face/neck. Please follow these instructions carefully:  1.  Shower with CHG Soap the night before surgery and the  morning of Surgery.  2.  If you choose to wash your hair, wash your hair first as usual with your  normal  shampoo.  3.  After you shampoo, rinse your hair and body thoroughly to remove the  shampoo.                           4.  Use CHG as you would any other liquid soap.  You can apply chg directly  to the skin and wash                       Gently with a scrungie or clean washcloth.  5.  Apply the CHG Soap to your body ONLY FROM THE NECK DOWN.   Do not use on face/ open                           Wound or open sores. Avoid contact with eyes, ears mouth and genitals (private parts).                       Wash face,  Genitals (private parts) with your normal soap.             6.  Wash thoroughly, paying special attention to the area where your surgery  will be performed.  7.  Thoroughly rinse your body with warm water from the neck down.  8.  DO NOT shower/wash with your normal soap after using and rinsing off  the CHG Soap.                9.  Pat yourself dry with a clean towel.            10.  Wear clean pajamas.  11.  Place clean sheets on your bed the night of your first shower and do not  sleep with pets. Day of Surgery : Do not apply any lotions/deodorants the morning of surgery.  Please wear clean clothes to the hospital/surgery center.  FAILURE TO FOLLOW THESE INSTRUCTIONS MAY RESULT IN THE CANCELLATION OF YOUR SURGERY PATIENT SIGNATURE_________________________________  NURSE SIGNATURE__________________________________  ________________________________________________________________________

## 2017-03-09 LAB — HEMOGLOBIN A1C
Hgb A1c MFr Bld: 6 % — ABNORMAL HIGH (ref 4.8–5.6)
MEAN PLASMA GLUCOSE: 126 mg/dL

## 2017-03-09 NOTE — Progress Notes (Signed)
03-08-17 BMP result routed to Dr. Ninfa Linden for review

## 2017-03-13 NOTE — Anesthesia Preprocedure Evaluation (Addendum)
Anesthesia Evaluation  Patient identified by MRN, date of birth, ID band Patient awake    Reviewed: Allergy & Precautions, NPO status , Patient's Chart, lab work & pertinent test results  History of Anesthesia Complications Negative for: history of anesthetic complications  Airway Mallampati: II  TM Distance: >3 FB Neck ROM: Full    Dental no notable dental hx. (+) Dental Advisory Given   Pulmonary neg pulmonary ROS, former smoker,    Pulmonary exam normal        Cardiovascular negative cardio ROS Normal cardiovascular exam     Neuro/Psych negative neurological ROS  negative psych ROS   GI/Hepatic negative GI ROS, Neg liver ROS,   Endo/Other  diabetes  Renal/GU negative Renal ROS     Musculoskeletal negative musculoskeletal ROS (+)   Abdominal   Peds  Hematology negative hematology ROS (+)   Anesthesia Other Findings Day of surgery medications reviewed with the patient.  Reproductive/Obstetrics                            Anesthesia Physical Anesthesia Plan  ASA: II  Anesthesia Plan: General   Post-op Pain Management:    Induction: Intravenous  PONV Risk Score and Plan: 4 or greater and Ondansetron, Dexamethasone, Diphenhydramine and Treatment may vary due to age or medical condition  Airway Management Planned: Oral ETT  Additional Equipment:   Intra-op Plan:   Post-operative Plan: Extubation in OR  Informed Consent: I have reviewed the patients History and Physical, chart, labs and discussed the procedure including the risks, benefits and alternatives for the proposed anesthesia with the patient or authorized representative who has indicated his/her understanding and acceptance.   Dental advisory given  Plan Discussed with: CRNA and Anesthesiologist  Anesthesia Plan Comments:        Anesthesia Quick Evaluation

## 2017-03-13 NOTE — H&P (Signed)
Rose Smith  Location: [facility] Surgery Patient #: [id number] DOB: [birth year] Undefined / Language: Rose Smith / Race: White Female   History of Present Illness Rose Smith A. Ninfa Linden MD;  The patient is a 77 year old female who presents for evaluation of gall stones. This is a pleasant patient referred by Dr. Laurence Spates for symptomatic cholelithiasis. She recently had epigastric abdominal pain with nausea and vomiting after a fatty meal. She continues to have some epigastric abdominal discomfort. She had an upper endoscopy showing a tiny ulcer is being treated for this but her symptoms not resolve. She does have pain in the back as well. The pain is mild to moderate in intensity and is described as a burning discomfort. Again, she currently has bloating as well. She's had loose bowel movement. She is otherwise without complaints.   Past Surgical History Rose Smith, CMA;  Foot Surgery  Bilateral. Hysterectomy (not due to cancer) - Partial  Shoulder Surgery  Bilateral. Tonsillectomy   Diagnostic Studies History Rose Smith, CMA;  Colonoscopy  1-5 years ago Mammogram  within last year Pap Smear  >5 years ago  Allergies Rose Smith, CMA; Penicillins   Medication History Rose Smith, CMA;  Meloxicam (7.5MG  Tablet, Oral) Active. Potassium Chloride ER (10MEQ Capsule ER, Oral) Active. MetFORMIN HCl (500MG  Tablet, Oral) Active. Pravastatin Sodium (40MG  Tablet, Oral) Active. Lyrica (75MG  Capsule, Oral) Active. Omeprazole (20MG  Tablet DR, Oral) Active. Aspirin (81MG  Tablet Chewable, Oral) Active. Cholecalciferol (1000UNIT Capsule, Oral) Active. Medications Reconciled  Pregnancy / Birth History Rose Smith, CMA;  Age at menarche  60 years. Age of menopause  51-55 Contraceptive History  Oral contraceptives. Gravida  4 Length (months) of breastfeeding  3-6 Maternal age  5-20 Para  4  Other Problems (Rose Smith, CMA;   Arthritis  Cholelithiasis  Diabetes Mellitus  Gastric Ulcer  Heart murmur  Hypercholesterolemia  Transfusion history     Review of Systems Rose Smith CMA;  General Not Present- Appetite Loss, Chills, Fatigue, Fever, Night Sweats, Weight Gain and Weight Loss. Skin Not Present- Change in Wart/Mole, Dryness, Hives, Jaundice, New Lesions, Non-Healing Wounds, Rash and Ulcer. HEENT Present- Ringing in the Ears and Wears glasses/contact lenses. Not Present- Earache, Hearing Loss, Hoarseness, Nose Bleed, Oral Ulcers, Seasonal Allergies, Sinus Pain, Sore Throat, Visual Disturbances and Yellow Eyes. Respiratory Present- Snoring. Not Present- Bloody sputum, Chronic Cough, Difficulty Breathing and Wheezing. Breast Not Present- Breast Mass, Breast Pain, Nipple Discharge and Skin Changes. Cardiovascular Not Present- Chest Pain, Difficulty Breathing Lying Down, Leg Cramps, Palpitations, Rapid Heart Rate, Shortness of Breath and Swelling of Extremities. Gastrointestinal Present- Bloating and Constipation. Not Present- Abdominal Pain, Bloody Stool, Change in Bowel Habits, Chronic diarrhea, Difficulty Swallowing, Excessive gas, Gets full quickly at meals, Hemorrhoids, Indigestion, Nausea, Rectal Pain and Vomiting. Female Genitourinary Present- Nocturia. Not Present- Frequency, Painful Urination, Pelvic Pain and Urgency. Musculoskeletal Present- Back Pain and Joint Pain. Not Present- Joint Stiffness, Muscle Pain, Muscle Weakness and Swelling of Extremities. Neurological Not Present- Decreased Memory, Fainting, Headaches, Numbness, Seizures, Tingling, Tremor, Trouble walking and Weakness. Psychiatric Not Present- Anxiety, Bipolar, Change in Sleep Pattern, Depression, Fearful and Frequent crying. Endocrine Not Present- Cold Intolerance, Excessive Hunger, Hair Changes, Heat Intolerance, Hot flashes and New Diabetes. Hematology Not Present- Blood Thinners, Easy Bruising, Excessive bleeding, Gland  problems, HIV and Persistent Infections.  Vitals (Rose Smith CMA; 01/12/2017 9:20 AM) 01/12/2017 9:20 AM Weight: 159.4 lb Height: 67in Body Surface Area: 1.84 m Body Mass Index: 24.97 kg/m  BP: 124/72 (Sitting, Left  Arm, Standard)       Physical Exam (Rose Smith A. Ninfa Linden MD;  General Mental Status-Alert. General Appearance-Consistent with stated age. Hydration-Well hydrated. Voice-Normal.  Head and Neck Head-normocephalic, atraumatic with no lesions or palpable masses.  Eye Eyeball - Bilateral-Extraocular movements intact. Sclera/Conjunctiva - Bilateral-No scleral icterus.  Chest and Lung Exam Chest and lung exam reveals -quiet, even and easy respiratory effort with no use of accessory muscles and on auscultation, normal breath sounds, no adventitious sounds and normal vocal resonance. Inspection Chest Wall - Normal. Back - normal.  Cardiovascular Cardiovascular examination reveals -on palpation PMI is normal in location and amplitude, no palpable S3 or S4. Normal cardiac borders., normal heart sounds, regular rate and rhythm with no murmurs, carotid auscultation reveals no bruits and normal pedal pulses bilaterally.  Abdomen Inspection Inspection of the abdomen reveals - No Hernias. Skin - Scar - no surgical scars. Palpation/Percussion Palpation and Percussion of the abdomen reveal - Soft, No Rebound tenderness, No Rigidity (guarding) and No hepatosplenomegaly. Tenderness - Epigastrium. Auscultation Auscultation of the abdomen reveals - Bowel sounds normal.  Neurologic - Did not examine.  Musculoskeletal - Did not examine.    Assessment & Plan (Rose Smith A. Ninfa Linden MD;   SYMPTOMATIC CHOLELITHIASIS (K80.20)  Impression: I have reviewed the patient's ultrasound. This shows cholelithiasis with one gallstone measuring 2.1 cm. There was no gallbladder wall thickening antibiotics were normal. I discussed gallbladder disease in detail with  the patient and her husband. I suspect her symptoms are related to gallbladder. I recommend laparoscopic cholecystectomy. I discussed the surgical procedure in detail and gave him literature regarding the surgery. I discussed the risks of surgery which includes but is not limited to bleeding, infection, injury to surrounding structures, bile duct injury, bile leak, the need to convert to an open procedure, cardiopulmonary issues, postoperative recovery, etc. They understand and wish to proceed with surgery

## 2017-03-14 ENCOUNTER — Encounter (HOSPITAL_COMMUNITY)
Admission: RE | Disposition: A | Discharge status: Home / Self Care | Payer: Self-pay | Source: Ambulatory Visit | Attending: Surgery

## 2017-03-14 ENCOUNTER — Ambulatory Visit (HOSPITAL_COMMUNITY)
Admission: RE | Admit: 2017-03-14 | Discharge: 2017-03-14 | Disposition: A | Discharge status: Home / Self Care | Payer: Medicare HMO | Source: Ambulatory Visit | Attending: Surgery | Admitting: Surgery

## 2017-03-14 ENCOUNTER — Ambulatory Visit (HOSPITAL_COMMUNITY): Payer: Medicare HMO | Admitting: Anesthesiology

## 2017-03-14 ENCOUNTER — Encounter (HOSPITAL_COMMUNITY): Payer: Self-pay | Admitting: *Deleted

## 2017-03-14 DIAGNOSIS — E7800 Pure hypercholesterolemia, unspecified: Secondary | ICD-10-CM | POA: Diagnosis not present

## 2017-03-14 DIAGNOSIS — Z7984 Long term (current) use of oral hypoglycemic drugs: Secondary | ICD-10-CM | POA: Diagnosis not present

## 2017-03-14 DIAGNOSIS — Z79899 Other long term (current) drug therapy: Secondary | ICD-10-CM | POA: Insufficient documentation

## 2017-03-14 DIAGNOSIS — Z87891 Personal history of nicotine dependence: Secondary | ICD-10-CM | POA: Insufficient documentation

## 2017-03-14 DIAGNOSIS — Z7982 Long term (current) use of aspirin: Secondary | ICD-10-CM | POA: Diagnosis not present

## 2017-03-14 DIAGNOSIS — K8020 Calculus of gallbladder without cholecystitis without obstruction: Secondary | ICD-10-CM | POA: Diagnosis not present

## 2017-03-14 DIAGNOSIS — K8010 Calculus of gallbladder with chronic cholecystitis without obstruction: Secondary | ICD-10-CM | POA: Diagnosis not present

## 2017-03-14 DIAGNOSIS — E119 Type 2 diabetes mellitus without complications: Secondary | ICD-10-CM | POA: Diagnosis not present

## 2017-03-14 DIAGNOSIS — M7060 Trochanteric bursitis, unspecified hip: Secondary | ICD-10-CM | POA: Diagnosis not present

## 2017-03-14 HISTORY — PX: CHOLECYSTECTOMY: SHX55

## 2017-03-14 LAB — GLUCOSE, CAPILLARY
GLUCOSE-CAPILLARY: 166 mg/dL — AB (ref 65–99)
Glucose-Capillary: 89 mg/dL (ref 65–99)
Glucose-Capillary: 146 mg/dL — ABNORMAL HIGH (ref 65–99)

## 2017-03-14 SURGERY — LAPAROSCOPIC CHOLECYSTECTOMY
Anesthesia: General

## 2017-03-14 MED ORDER — SUGAMMADEX SODIUM 200 MG/2ML IV SOLN
INTRAVENOUS | Status: AC
  Filled 2017-03-14: qty 2

## 2017-03-14 MED ORDER — ACETAMINOPHEN 10 MG/ML IV SOLN
INTRAVENOUS | Status: AC
  Filled 2017-03-14: qty 100

## 2017-03-14 MED ORDER — FENTANYL CITRATE (PF) 100 MCG/2ML IJ SOLN
INTRAMUSCULAR | Status: AC
  Filled 2017-03-14: qty 2

## 2017-03-14 MED ORDER — ROCURONIUM BROMIDE 10 MG/ML (PF) SYRINGE
PREFILLED_SYRINGE | INTRAVENOUS | Status: DC | PRN
  Administered 2017-03-14: 30 mg via INTRAVENOUS

## 2017-03-14 MED ORDER — CIPROFLOXACIN IN D5W 400 MG/200ML IV SOLN
400.0000 mg | INTRAVENOUS | Status: AC
  Administered 2017-03-14: 400 mg via INTRAVENOUS
  Filled 2017-03-14: qty 200

## 2017-03-14 MED ORDER — DEXAMETHASONE SODIUM PHOSPHATE 10 MG/ML IJ SOLN
INTRAMUSCULAR | Status: DC | PRN
  Administered 2017-03-14: 10 mg via INTRAVENOUS

## 2017-03-14 MED ORDER — PROPOFOL 10 MG/ML IV BOLUS
INTRAVENOUS | Status: DC | PRN
  Administered 2017-03-14: 150 mg via INTRAVENOUS

## 2017-03-14 MED ORDER — CHLORHEXIDINE GLUCONATE CLOTH 2 % EX PADS: 6.0000 | MEDICATED_PAD | Freq: Once | CUTANEOUS | Status: DC

## 2017-03-14 MED ORDER — DIPHENHYDRAMINE HCL 50 MG/ML IJ SOLN
INTRAMUSCULAR | Status: DC | PRN
  Administered 2017-03-14: 12.5 mg via INTRAVENOUS

## 2017-03-14 MED ORDER — PHENYLEPHRINE 40 MCG/ML (10ML) SYRINGE FOR IV PUSH (FOR BLOOD PRESSURE SUPPORT)
PREFILLED_SYRINGE | INTRAVENOUS | Status: AC
  Filled 2017-03-14: qty 10

## 2017-03-14 MED ORDER — ACETAMINOPHEN 10 MG/ML IV SOLN
1000.0000 mg | Freq: Once | INTRAVENOUS | Status: AC
  Administered 2017-03-14: 1000 mg via INTRAVENOUS

## 2017-03-14 MED ORDER — TRAMADOL HCL 50 MG PO TABS
50.0000 mg | ORAL_TABLET | Freq: Once | ORAL | Status: AC
  Administered 2017-03-14: 50 mg via ORAL

## 2017-03-14 MED ORDER — ONDANSETRON HCL 4 MG/2ML IJ SOLN
INTRAMUSCULAR | Status: DC | PRN
  Administered 2017-03-14: 4 mg via INTRAVENOUS

## 2017-03-14 MED ORDER — PROPOFOL 10 MG/ML IV BOLUS
INTRAVENOUS | Status: AC
  Filled 2017-03-14: qty 20

## 2017-03-14 MED ORDER — FENTANYL CITRATE (PF) 100 MCG/2ML IJ SOLN
INTRAMUSCULAR | Status: DC | PRN
  Administered 2017-03-14 (×2): 50 ug via INTRAVENOUS

## 2017-03-14 MED ORDER — BUPIVACAINE HCL (PF) 0.5 % IJ SOLN
INTRAMUSCULAR | Status: AC
  Filled 2017-03-14: qty 30

## 2017-03-14 MED ORDER — DIPHENHYDRAMINE HCL 50 MG/ML IJ SOLN
INTRAMUSCULAR | Status: AC
  Filled 2017-03-14: qty 1

## 2017-03-14 MED ORDER — DEXAMETHASONE SODIUM PHOSPHATE 10 MG/ML IJ SOLN
INTRAMUSCULAR | Status: AC
  Filled 2017-03-14: qty 1

## 2017-03-14 MED ORDER — ROCURONIUM BROMIDE 10 MG/ML (PF) SYRINGE
PREFILLED_SYRINGE | INTRAVENOUS | Status: AC
  Filled 2017-03-14: qty 5

## 2017-03-14 MED ORDER — PROMETHAZINE HCL 25 MG/ML IJ SOLN
6.2500 mg | INTRAMUSCULAR | Status: DC | PRN
  Administered 2017-03-14: 6.25 mg via INTRAVENOUS

## 2017-03-14 MED ORDER — LIDOCAINE 2% (20 MG/ML) 5 ML SYRINGE
INTRAMUSCULAR | Status: DC | PRN
  Administered 2017-03-14: 100 mg via INTRAVENOUS

## 2017-03-14 MED ORDER — LACTATED RINGERS IV SOLN
INTRAVENOUS | Status: DC
  Administered 2017-03-14: 07:00:00 via INTRAVENOUS

## 2017-03-14 MED ORDER — PHENYLEPHRINE 40 MCG/ML (10ML) SYRINGE FOR IV PUSH (FOR BLOOD PRESSURE SUPPORT)
PREFILLED_SYRINGE | INTRAVENOUS | Status: DC | PRN
  Administered 2017-03-14: 80 ug via INTRAVENOUS

## 2017-03-14 MED ORDER — PROMETHAZINE HCL 25 MG/ML IJ SOLN
INTRAMUSCULAR | Status: AC
  Filled 2017-03-14: qty 1

## 2017-03-14 MED ORDER — LIDOCAINE 2% (20 MG/ML) 5 ML SYRINGE
INTRAMUSCULAR | Status: AC
  Filled 2017-03-14: qty 5

## 2017-03-14 MED ORDER — LACTATED RINGERS IR SOLN
Status: DC | PRN
  Administered 2017-03-14: 1000 mL

## 2017-03-14 MED ORDER — FENTANYL CITRATE (PF) 100 MCG/2ML IJ SOLN
25.0000 ug | INTRAMUSCULAR | Status: DC | PRN
  Administered 2017-03-14 (×3): 50 ug via INTRAVENOUS

## 2017-03-14 MED ORDER — TRAMADOL HCL 50 MG PO TABS: 50.0000 mg | ORAL_TABLET | Freq: Four times a day (QID) | ORAL | 0 refills | Status: DC | PRN

## 2017-03-14 MED ORDER — TRAMADOL HCL 50 MG PO TABS
ORAL_TABLET | ORAL | Status: AC
  Filled 2017-03-14: qty 1

## 2017-03-14 MED ORDER — ONDANSETRON HCL 4 MG/2ML IJ SOLN
INTRAMUSCULAR | Status: AC
  Filled 2017-03-14: qty 2

## 2017-03-14 MED ORDER — SUGAMMADEX SODIUM 200 MG/2ML IV SOLN
INTRAVENOUS | Status: DC | PRN
  Administered 2017-03-14: 200 mg via INTRAVENOUS

## 2017-03-14 SURGICAL SUPPLY — 29 items
ADH SKN CLS APL DERMABOND .7 (GAUZE/BANDAGES/DRESSINGS) ×1
APPLIER CLIP 5 13 M/L LIGAMAX5 (MISCELLANEOUS) ×2
APR CLP MED LRG 5 ANG JAW (MISCELLANEOUS) ×1
BAG SPEC RTRVL LRG 6X4 10 (ENDOMECHANICALS) ×1
CABLE HIGH FREQUENCY MONO STRZ (ELECTRODE) ×2 IMPLANT
CHLORAPREP W/TINT 26ML (MISCELLANEOUS) ×2 IMPLANT
CLIP APPLIE 5 13 M/L LIGAMAX5 (MISCELLANEOUS) ×1 IMPLANT
COVER MAYO STAND STRL (DRAPES) IMPLANT
DECANTER SPIKE VIAL GLASS SM (MISCELLANEOUS) ×2 IMPLANT
DERMABOND ADVANCED (GAUZE/BANDAGES/DRESSINGS) ×1
DERMABOND ADVANCED .7 DNX12 (GAUZE/BANDAGES/DRESSINGS) ×1 IMPLANT
DRAPE C-ARM 42X120 X-RAY (DRAPES) IMPLANT
ELECT REM PT RETURN 15FT ADLT (MISCELLANEOUS) ×2 IMPLANT
GLOVE SURG SIGNA 7.5 PF LTX (GLOVE) ×12 IMPLANT
GOWN STRL REUS W/TWL XL LVL3 (GOWN DISPOSABLE) ×6 IMPLANT
HEMOSTAT SURGICEL 4X8 (HEMOSTASIS) IMPLANT
KIT BASIN OR (CUSTOM PROCEDURE TRAY) ×2 IMPLANT
POUCH SPECIMEN RETRIEVAL 10MM (ENDOMECHANICALS) ×2 IMPLANT
SCISSORS LAP 5X35 DISP (ENDOMECHANICALS) ×2 IMPLANT
SET CHOLANGIOGRAPH MIX (MISCELLANEOUS) IMPLANT
SET IRRIG TUBING LAPAROSCOPIC (IRRIGATION / IRRIGATOR) ×2 IMPLANT
SLEEVE XCEL OPT CAN 5 100 (ENDOMECHANICALS) ×4 IMPLANT
SUT MNCRL AB 4-0 PS2 18 (SUTURE) ×2 IMPLANT
TOWEL OR 17X26 10 PK STRL BLUE (TOWEL DISPOSABLE) ×2 IMPLANT
TOWEL OR NON WOVEN STRL DISP B (DISPOSABLE) ×2 IMPLANT
TRAY LAPAROSCOPIC (CUSTOM PROCEDURE TRAY) ×2 IMPLANT
TROCAR BLADELESS OPT 5 100 (ENDOMECHANICALS) ×2 IMPLANT
TROCAR XCEL BLUNT TIP 100MML (ENDOMECHANICALS) ×2 IMPLANT
TUBING INSUF HEATED (TUBING) ×2 IMPLANT

## 2017-03-14 NOTE — Interval H&P Note (Signed)
History and Physical Interval Note:  No change in H and P  03/14/2017 7:51 AM  Rose Smith  has presented today for surgery, with the diagnosis of SYMPTOMATIC CHOLELITHIASIS  The various methods of treatment have been discussed with the patient and family. After consideration of risks, benefits and other options for treatment, the patient has consented to  Procedure(s): LAPAROSCOPIC CHOLECYSTECTOMY (N/A) as a surgical intervention .  The patient's history has been reviewed, patient examined, no change in status, stable for surgery.  I have reviewed the patient's chart and labs.  Questions were answered to the patient's satisfaction.     Lakeeta Dobosz A

## 2017-03-14 NOTE — Discharge Instructions (Signed)
[facility] ______[facility] LAPAROSCOPIC SURGERY: POST OP INSTRUCTIONS Always review your discharge instruction sheet given to you by the facility where your surgery was performed. IF YOU HAVE DISABILITY OR FAMILY LEAVE FORMS, YOU MUST BRING THEM TO THE OFFICE FOR PROCESSING.   DO NOT GIVE THEM TO YOUR DOCTOR.  1. A prescription for pain medication may be given to you upon discharge.  Take your pain medication as prescribed, if needed.  If narcotic pain medicine is not needed, then you may take acetaminophen (Tylenol) or ibuprofen (Advil) as needed. 2. Take your usually prescribed medications unless otherwise directed. 3. If you need a refill on your pain medication, please contact your pharmacy.  They will contact our office to request authorization. Prescriptions will not be filled after 5pm or on week-ends. 4. You should follow a light diet the first few days after arrival home, such as soup and crackers, etc.  Be sure to include lots of fluids daily. 5. Most patients will experience some swelling and bruising in the area of the incisions.  Ice packs will help.  Swelling and bruising can take several days to resolve.  6. It is common to experience some constipation if taking pain medication after surgery.  Increasing fluid intake and taking a stool softener (such as Colace) will usually help or prevent this problem from occurring.  A mild laxative (Milk of Magnesia or Miralax) should be taken according to package instructions if there are no bowel movements after 48 hours. 7. Unless discharge instructions indicate otherwise, you may remove your bandages 24-48 hours after surgery, and you may shower at that time.  You may have steri-strips (small skin tapes) in place directly over the incision.  These strips should be left on the skin for 7-10 days.  If your surgeon used skin glue on the incision, you may shower in 24 hours.  The glue will flake off over the next 2-3 weeks.  Any sutures or  staples will be removed at the office during your follow-up visit. 8. ACTIVITIES:  You may resume regular (light) daily activities beginning the next day--such as daily self-care, walking, climbing stairs--gradually increasing activities as tolerated.  You may have sexual intercourse when it is comfortable.  Refrain from any heavy lifting or straining until approved by your doctor. a. You may drive when you are no longer taking prescription pain medication, you can comfortably wear a seatbelt, and you can safely maneuver your car and apply brakes. b. RETURN TO WORK:  __________________________________________________________ 9. You should see your doctor in the office for a follow-up appointment approximately 2-3 weeks after your surgery.  Make sure that you call for this appointment within a day or two after you arrive home to insure a convenient appointment time. 10. OTHER INSTRUCTIONS:OK TO SHOWER STARTING TOMORROW.  NO SWIMMING FOR ONE WEEK 11. NO LIFTING MORE THAN 15 POUNDS FOR 2 WEEKS 12. ICE PACK, TYLENOL, AND IBUPROFEN ALSO FOR PAIN __________________________________________________________________________________________________________________________ __________________________________________________________________________________________________________________________ WHEN TO CALL YOUR DOCTOR: 1. Fever over 101.0 2. Inability to urinate 3. Continued bleeding from incision. 4. Increased pain, redness, or drainage from the incision. 5. Increasing abdominal pain  The clinic staff is available to answer your questions during regular business hours.  Please dont hesitate to call and ask to speak to one of the nurses for clinical concerns.  If you have a medical emergency, go to the nearest emergency room or call 911.  A surgeon from [facility] is always on call at the hospital.  [address], [place], [place], [place]  [address] ? [address], [place], [place] [id number] ? [phone] ? FAX [phone] Web site: [email or web address]

## 2017-03-14 NOTE — Anesthesia Procedure Notes (Signed)
Procedure Name: Intubation Date/Time: 03/14/2017 8:41 AM Performed by: Victoriano Lain, CRNA Pre-anesthesia Checklist: Patient identified, Emergency Drugs available, Suction available, Patient being monitored and Timeout performed Patient Re-evaluated:Patient Re-evaluated prior to induction Oxygen Delivery Method: Circle system utilized Preoxygenation: Pre-oxygenation with 100% oxygen Induction Type: IV induction Ventilation: Mask ventilation without difficulty Laryngoscope Size: Mac and 4 Grade View: Grade I Tube type: Oral Tube size: 7.5 mm Number of attempts: 1 Airway Equipment and Method: Stylet Placement Confirmation: ETT inserted through vocal cords under direct vision,  positive ETCO2 and breath sounds checked- equal and bilateral Secured at: 21 cm Tube secured with: Tape Dental Injury: Teeth and Oropharynx as per pre-operative assessment

## 2017-03-14 NOTE — Anesthesia Postprocedure Evaluation (Signed)
Anesthesia Post Note  Patient: Rose Smith  Procedure(s) Performed: LAPAROSCOPIC CHOLECYSTECTOMY (N/A )     Patient location during evaluation: PACU Anesthesia Type: General Level of consciousness: sedated Pain management: pain level controlled Vital Signs Assessment: post-procedure vital signs reviewed and stable Respiratory status: spontaneous breathing and respiratory function stable Cardiovascular status: stable Postop Assessment: no apparent nausea or vomiting Anesthetic complications: no    Last Vitals:  Vitals:   03/14/17 0921 03/14/17 0930  BP: (!) 150/64 (!) 151/70  Pulse: (!) 58 (!) 57  Resp: 14 10  Temp: 36.4 C   SpO2: 97% 97%    Last Pain:  Vitals:   03/14/17 1015  TempSrc:   PainSc: Asleep                 Herta Hink DANIEL

## 2017-03-14 NOTE — Op Note (Signed)

## 2017-03-14 NOTE — Transfer of Care (Signed)
Immediate Anesthesia Transfer of Care Note  Patient: Rose Smith  Procedure(s) Performed: LAPAROSCOPIC CHOLECYSTECTOMY (N/A )  Patient Location: PACU  Anesthesia Type:General  Level of Consciousness: awake, alert , oriented and patient cooperative  Airway & Oxygen Therapy: Patient Spontanous Breathing and Patient connected to face mask oxygen  Post-op Assessment: Report given to RN, Post -op Vital signs reviewed and stable and Patient moving all extremities  Post vital signs: Reviewed and stable  Last Vitals:  Vitals:   03/14/17 0653  BP: (!) 133/48  Pulse: 63  Resp: 16  Temp: 37.4 C  SpO2: 99%    Last Pain:  Vitals:   03/14/17 0653  TempSrc: Oral         Complications: No apparent anesthesia complications

## 2017-04-13 DIAGNOSIS — D225 Melanocytic nevi of trunk: Secondary | ICD-10-CM | POA: Diagnosis not present

## 2017-04-13 DIAGNOSIS — L570 Actinic keratosis: Secondary | ICD-10-CM | POA: Diagnosis not present

## 2017-04-13 DIAGNOSIS — D1801 Hemangioma of skin and subcutaneous tissue: Secondary | ICD-10-CM | POA: Diagnosis not present

## 2017-04-13 DIAGNOSIS — L814 Other melanin hyperpigmentation: Secondary | ICD-10-CM | POA: Diagnosis not present

## 2017-04-30 DIAGNOSIS — M5134 Other intervertebral disc degeneration, thoracic region: Secondary | ICD-10-CM | POA: Diagnosis not present

## 2017-04-30 DIAGNOSIS — M47814 Spondylosis without myelopathy or radiculopathy, thoracic region: Secondary | ICD-10-CM | POA: Diagnosis not present

## 2017-04-30 DIAGNOSIS — M545 Low back pain: Secondary | ICD-10-CM | POA: Diagnosis not present

## 2017-05-02 DIAGNOSIS — H401131 Primary open-angle glaucoma, bilateral, mild stage: Secondary | ICD-10-CM | POA: Diagnosis not present

## 2017-05-15 DIAGNOSIS — R69 Illness, unspecified: Secondary | ICD-10-CM | POA: Diagnosis not present

## 2017-05-24 DIAGNOSIS — M47816 Spondylosis without myelopathy or radiculopathy, lumbar region: Secondary | ICD-10-CM | POA: Diagnosis not present

## 2017-06-06 DIAGNOSIS — M8588 Other specified disorders of bone density and structure, other site: Secondary | ICD-10-CM | POA: Diagnosis not present

## 2017-07-17 DIAGNOSIS — M797 Fibromyalgia: Secondary | ICD-10-CM | POA: Diagnosis not present

## 2017-07-17 DIAGNOSIS — N183 Chronic kidney disease, stage 3 (moderate): Secondary | ICD-10-CM | POA: Diagnosis not present

## 2017-07-17 DIAGNOSIS — E559 Vitamin D deficiency, unspecified: Secondary | ICD-10-CM | POA: Diagnosis not present

## 2017-07-17 DIAGNOSIS — E1121 Type 2 diabetes mellitus with diabetic nephropathy: Secondary | ICD-10-CM | POA: Diagnosis not present

## 2017-07-17 DIAGNOSIS — R32 Unspecified urinary incontinence: Secondary | ICD-10-CM | POA: Diagnosis not present

## 2017-07-17 DIAGNOSIS — M8580 Other specified disorders of bone density and structure, unspecified site: Secondary | ICD-10-CM | POA: Diagnosis not present

## 2017-07-17 DIAGNOSIS — E785 Hyperlipidemia, unspecified: Secondary | ICD-10-CM | POA: Diagnosis not present

## 2017-07-17 DIAGNOSIS — R69 Illness, unspecified: Secondary | ICD-10-CM | POA: Diagnosis not present

## 2017-07-17 DIAGNOSIS — K2970 Gastritis, unspecified, without bleeding: Secondary | ICD-10-CM | POA: Diagnosis not present

## 2017-07-17 DIAGNOSIS — Z0000 Encounter for general adult medical examination without abnormal findings: Secondary | ICD-10-CM | POA: Diagnosis not present

## 2017-07-24 DIAGNOSIS — H9201 Otalgia, right ear: Secondary | ICD-10-CM | POA: Diagnosis not present

## 2017-07-24 DIAGNOSIS — H6121 Impacted cerumen, right ear: Secondary | ICD-10-CM | POA: Diagnosis not present

## 2017-08-06 ENCOUNTER — Ambulatory Visit: Payer: Self-pay | Admitting: Sports Medicine

## 2017-08-07 DIAGNOSIS — H401131 Primary open-angle glaucoma, bilateral, mild stage: Secondary | ICD-10-CM | POA: Diagnosis not present

## 2017-08-16 ENCOUNTER — Ambulatory Visit: Payer: Self-pay | Admitting: Podiatry

## 2017-08-23 ENCOUNTER — Ambulatory Visit: Payer: Self-pay | Admitting: Podiatry

## 2017-08-31 DIAGNOSIS — M47814 Spondylosis without myelopathy or radiculopathy, thoracic region: Secondary | ICD-10-CM | POA: Diagnosis not present

## 2017-08-31 DIAGNOSIS — M5136 Other intervertebral disc degeneration, lumbar region: Secondary | ICD-10-CM | POA: Diagnosis not present

## 2017-08-31 DIAGNOSIS — M47816 Spondylosis without myelopathy or radiculopathy, lumbar region: Secondary | ICD-10-CM | POA: Diagnosis not present

## 2017-09-17 DIAGNOSIS — M25512 Pain in left shoulder: Secondary | ICD-10-CM | POA: Diagnosis not present

## 2017-09-18 DIAGNOSIS — M47814 Spondylosis without myelopathy or radiculopathy, thoracic region: Secondary | ICD-10-CM | POA: Diagnosis not present

## 2017-09-20 DIAGNOSIS — R69 Illness, unspecified: Secondary | ICD-10-CM | POA: Diagnosis not present

## 2017-09-21 ENCOUNTER — Ambulatory Visit: Payer: Medicare HMO | Admitting: Podiatry

## 2017-09-21 ENCOUNTER — Encounter: Payer: Self-pay | Admitting: Podiatry

## 2017-09-21 DIAGNOSIS — L603 Nail dystrophy: Secondary | ICD-10-CM | POA: Diagnosis not present

## 2017-09-21 DIAGNOSIS — M21611 Bunion of right foot: Secondary | ICD-10-CM

## 2017-09-21 DIAGNOSIS — M79609 Pain in unspecified limb: Principal | ICD-10-CM

## 2017-09-21 DIAGNOSIS — M2011 Hallux valgus (acquired), right foot: Secondary | ICD-10-CM

## 2017-09-21 DIAGNOSIS — M79676 Pain in unspecified toe(s): Secondary | ICD-10-CM

## 2017-09-21 DIAGNOSIS — B351 Tinea unguium: Secondary | ICD-10-CM | POA: Diagnosis not present

## 2017-09-21 MED ORDER — FLUCONAZOLE 150 MG PO TABS: 150.0000 mg | ORAL_TABLET | ORAL | 1 refills | Status: DC

## 2017-09-21 NOTE — Progress Notes (Signed)
Subjective:  Patient ID: Rose Smith, female    DOB: 1940-05-02,  MRN: [id number]  Chief Complaint  Patient presents with  . Nail Problem    left great toenail discolored  . Callouses    bilateral great toes     77 y.o. female presents with the above complaint.  Reports discoloration to the left great toenail.  States the nail is starting to be loose as well.  Does not like it with a nail looks.  Has been painting over it.  Also reports calluses to both feet and complains of the bunion to the right foot  Review of Systems: Negative except as noted in the HPI. Denies N/V/F/Ch.  Past Medical History:  Diagnosis Date  . Arthritis    lower back  . Diabetes mellitus   . Heart murmur    MVP- causes pt no problems per pt     Current Outpatient Medications:  .  alendronate (FOSAMAX) 70 MG tablet, Take 70 mg by mouth every Monday. Take with a full glass of water on an empty stomach., Disp: , Rfl:  .  aspirin EC 81 MG tablet, Take 81 mg by mouth daily., Disp: , Rfl:  .  B Complex-C (B-COMPLEX WITH VITAMIN C) tablet, Take 1 tablet by mouth daily., Disp: , Rfl:  .  calcium-vitamin D (OSCAL WITH D) 500-200 MG-UNIT tablet, Take 1 tablet by mouth daily., Disp: , Rfl:  .  CINNAMON PO, Take 1 tablet by mouth daily., Disp: , Rfl:  .  Cyanocobalamin (VITAMIN B-12 PO), Take 1 tablet by mouth daily., Disp: , Rfl:  .  fluconazole (DIFLUCAN) 150 MG tablet, Take 1 tablet (150 mg total) by mouth once a week., Disp: 6 tablet, Rfl: 1 .  gemfibrozil (LOPID) 600 MG tablet, Take 600 mg by mouth 2 (two) times daily before a meal. , Disp: , Rfl:  .  GINSENG PO, Take 1 capsule by mouth daily., Disp: , Rfl:  .  Glucomannan (GLUCOMANNAN KONJAC ROOT) 1000 MG CAPS, Take 2,000 mg by mouth at bedtime. , Disp: , Rfl:  .  ibuprofen (ADVIL,MOTRIN) 600 MG tablet, Take 1 tablet (600 mg total) by mouth every 6 (six) hours as needed. (Patient not taking: Reported on 02/27/2017), Disp: 30 tablet, Rfl: 0 .  ketoconazole  (NIZORAL) 2 % cream, Apply 1 application topically daily as needed for irritation., Disp: , Rfl:  .  latanoprost (XALATAN) 0.005 % ophthalmic solution, Place 1 drop into both eyes at bedtime., Disp: , Rfl:  .  magnesium gluconate (MAGONATE) 500 MG tablet, Take 500 mg by mouth at bedtime., Disp: , Rfl:  .  Melatonin 5 MG CAPS, Take 5 mg by mouth at bedtime., Disp: , Rfl:  .  meloxicam (MOBIC) 7.5 MG tablet, Take 7.5 mg by mouth daily., Disp: , Rfl:  .  metFORMIN (GLUCOPHAGE-XR) 500 MG 24 hr tablet, TAKE 1 TABLET (500 MG TOTAL) BY MOUTH 2 (TWO) TIMES DAILY BEFORE MEALS., Disp: , Rfl:  .  Omega-3 Fatty Acids (FISH OIL) 1200 MG CPDR, Take 1,200 mg by mouth daily. , Disp: , Rfl:  .  omeprazole (PRILOSEC) 40 MG capsule, Take 40 mg by mouth daily. , Disp: , Rfl:  .  oxybutynin (DITROPAN) 5 MG tablet, Take 5 mg by mouth daily as needed for bladder spasms., Disp: , Rfl:  .  polyethylene glycol (MIRALAX / GLYCOLAX) packet, Take 8.5 g by mouth daily., Disp: , Rfl:  .  pregabalin (LYRICA) 75 MG capsule, Take 75 mg by  mouth daily., Disp: , Rfl:  .  traMADol (ULTRAM) 50 MG tablet, Take 1 tablet (50 mg total) by mouth every 6 (six) hours as needed for moderate pain., Disp: 30 tablet, Rfl: 0 .  TURMERIC PO, Take 1 capsule by mouth daily., Disp: , Rfl:   Social History   Tobacco Use  Smoking Status Former Smoker  . Last attempt to quit: 01/02/1998  . Years since quitting: 19.7  Smokeless Tobacco Never Used    Allergies  Allergen Reactions  . Cefdinir Hives and Other (See Comments)    Myalgia   . Penicillins Rash and Other (See Comments)    Has patient had a PCN reaction causing immediate rash, facial/tongue/throat swelling, SOB or lightheadedness with hypotension: No Has patient had a PCN reaction causing severe rash involving mucus membranes or skin necrosis: No Has patient had a PCN reaction that required hospitalization: No Has patient had a PCN reaction occurring within the last 10 years: No If all  of the above answers are "NO", then may proceed with Cephalosporin use.    Objective:  There were no vitals filed for this visit. There is no height or weight on file to calculate BMI. Constitutional Well developed. Well nourished.  Vascular Dorsalis pedis pulses palpable bilaterally. Posterior tibial pulses palpable bilaterally. Capillary refill normal to all digits.  No cyanosis or clubbing noted. Pedal hair growth normal.  Neurologic Normal speech. Oriented to person, place, and time. Epicritic sensation to light touch grossly present bilaterally.  Dermatologic Left hallux nail dystrophic with slight pitting and brown-green discoloration. No open wounds.  Slight lysis of the nail distally  Pinch callus bilateral hallux  Orthopedic: Normal joint ROM without pain or crepitus bilaterally. HAV deformity right.  No bony tenderness.   Radiographs: None today Assessment:   1. 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 Nail dystrophy   3. Pain around toenail   4. 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  Onychomycosis -Educated on etiology of nail fungus. -eRx for oral fluconazole. Educated on risks and benefits of the medication. -Now debrided back to area without nail lysis  Hallux valgus right foot -Defer surgical intervention as patient has no pain at this area.  Pinch calluses while -Educated on biomechanical etiology -Pared courtesy for patient  Return in about 5 weeks (around 10/26/2017) for Nail Fungus, Left great toe.

## 2017-09-21 NOTE — Patient Instructions (Signed)

## 2017-09-24 DIAGNOSIS — R69 Illness, unspecified: Secondary | ICD-10-CM | POA: Diagnosis not present

## 2017-10-23 ENCOUNTER — Telehealth: Payer: Self-pay | Admitting: Podiatry

## 2017-10-23 NOTE — Telephone Encounter (Signed)
Left message informing pt, the prescription had a refill and we did need to see her to check progress.

## 2017-10-23 NOTE — Telephone Encounter (Signed)
Pt wanted a refill on medication. Patient canceled appointment on 10/25/2017

## 2017-10-25 ENCOUNTER — Ambulatory Visit: Payer: Medicare HMO | Admitting: Podiatry

## 2017-11-06 ENCOUNTER — Telehealth: Payer: Self-pay | Admitting: Podiatry

## 2017-11-06 MED ORDER — CLOTRIMAZOLE 1 % EX CREA: TOPICAL_CREAM | CUTANEOUS | 0 refills | Status: DC

## 2017-11-06 NOTE — Telephone Encounter (Signed)
I stopped and got the medication for the antifungal prescribed by Dr. March Rummage yesterday at [place] between there and my home I stopped to dispose of some chemicals at the city place. My medicine was in the back and I think that one of the men saw the prescription bag and thought it was something they could use because my medication was not back there when I got home and I didn't stop anywhere else. I was wondering if Dr. March Rummage would be willing to call in another prescription for that medication. [phone] is my number.

## 2017-11-06 NOTE — Telephone Encounter (Signed)
Called pt to let her know Dr. March Rummage called in a different cream so insurance would pay. Pt asked the name of the cream because she has been using one and its not working. I told her she was prescribed lotrimin and to call us with any other issues or questions.

## 2017-11-06 NOTE — Addendum Note (Signed)
Addended by: Hardie Pulley on: 11/06/2017 03:41 PM   Modules accepted: Orders

## 2017-11-07 NOTE — Telephone Encounter (Signed)
I called yesterday and its cheaper for me to pay for the fluconazole out of pocket. I was wondering if Dr. March Rummage would call that in since the prescription I picked up the other day seems to have gotten lost and I will continue taking the last 6 pills I have left. If you could call me back at [phone] and let me know if that will work. Thank you.

## 2017-11-08 MED ORDER — FLUCONAZOLE 150 MG PO TABS: 150.0000 mg | ORAL_TABLET | ORAL | 1 refills | Status: DC

## 2017-11-08 NOTE — Addendum Note (Signed)
Addended by: Harriett Sine D on: 11/08/2017 01:27 PM   Modules accepted: Orders

## 2017-11-08 NOTE — Telephone Encounter (Signed)
Dr. March Rummage states refill the diflucan as previously. I informed pt of the orders.

## 2017-11-12 DIAGNOSIS — Z1231 Encounter for screening mammogram for malignant neoplasm of breast: Secondary | ICD-10-CM | POA: Diagnosis not present

## 2017-12-05 DIAGNOSIS — H401131 Primary open-angle glaucoma, bilateral, mild stage: Secondary | ICD-10-CM | POA: Diagnosis not present

## 2018-05-31 ENCOUNTER — Telehealth: Payer: Self-pay | Admitting: Podiatry

## 2018-05-31 NOTE — Telephone Encounter (Signed)
She will need an appt to discuss further options

## 2018-05-31 NOTE — Telephone Encounter (Signed)
Pt was last seen on 09/21/17 for discoloration in left great toenail. Pt has taken 3 rounds of a antifungal medication and still has discoloration. Pt would like to know what she can do next.

## 2018-06-03 NOTE — Telephone Encounter (Signed)
I informed pt of Dr. Eleanora Neighbor recommendation for an appt to discuss options.

## 2018-06-14 ENCOUNTER — Encounter: Payer: Self-pay | Admitting: Podiatry

## 2018-06-14 ENCOUNTER — Other Ambulatory Visit: Payer: Self-pay

## 2018-06-14 ENCOUNTER — Ambulatory Visit: Payer: Medicare Other | Admitting: Podiatry

## 2018-06-14 VITALS — Temp 97.8°F

## 2018-06-14 DIAGNOSIS — Z79899 Other long term (current) drug therapy: Secondary | ICD-10-CM | POA: Diagnosis not present

## 2018-06-14 DIAGNOSIS — L603 Nail dystrophy: Secondary | ICD-10-CM

## 2018-06-18 LAB — HEPATIC FUNCTION PANEL
AG Ratio: 1.7 (calc) (ref 1.0–2.5)
ALT: 14 U/L (ref 6–29)
AST: 22 U/L (ref 10–35)
Albumin: 4.1 g/dL (ref 3.6–5.1)
Alkaline phosphatase (APISO): 52 U/L (ref 37–153)
Bilirubin, Direct: 0.1 mg/dL (ref 0.0–0.2)
Globulin: 2.4 g/dL (calc) (ref 1.9–3.7)
Indirect Bilirubin: 0.4 mg/dL (calc) (ref 0.2–1.2)
Total Bilirubin: 0.5 mg/dL (ref 0.2–1.2)
Total Protein: 6.5 g/dL (ref 6.1–8.1)

## 2018-06-24 ENCOUNTER — Telehealth: Payer: Self-pay | Admitting: *Deleted

## 2018-06-24 NOTE — Telephone Encounter (Signed)
Pt states she had labs drawn and saw in MyChart the results were available and that Dr. March Rummage was going to check to see what medication she would need to take for the fungus that is not going away.

## 2018-06-26 ENCOUNTER — Telehealth: Payer: Self-pay

## 2018-06-26 NOTE — Telephone Encounter (Signed)
Patient called wanting to know the results of her hepatic function panel.  She also wants to know when she can start taking the medication that she discussed with Dr. March Rummage at her last office visit.  She would like her medication sent to the [facility] on [address].

## 2018-06-26 NOTE — Telephone Encounter (Signed)
Error

## 2018-06-26 NOTE — Telephone Encounter (Signed)
Dr March Rummage please advise

## 2018-06-27 MED ORDER — TERBINAFINE HCL 250 MG PO TABS: 250.0000 mg | ORAL_TABLET | Freq: Every day | ORAL | 0 refills | Status: DC

## 2018-06-27 NOTE — Telephone Encounter (Signed)
LVM for patient informing her of normal lab results in regards to her hepatic function panel. I informed her that her medication will be sent to [facility] on [address] as requested.

## 2018-06-27 NOTE — Addendum Note (Signed)
Addended by: Roney Jaffe on: 06/27/2018 08:07 AM   Modules accepted: Orders

## 2018-06-30 NOTE — Telephone Encounter (Signed)
Lamisil was sent to patient's pharmacy after review of LFTs

## 2018-07-01 NOTE — Progress Notes (Signed)
Subjective:  Patient ID: Rose Smith, female    DOB: 28-Oct-1940,  MRN: [id number]  Chief Complaint  Patient presents with  . Nail Problem    toenail fungus and i took all the medicine and was getting better but it now is back to where i started     78 y.o. female presents with the above complaint. Hx as above.   Review of Systems: Negative except as noted in the HPI. Denies N/V/F/Ch.  Past Medical History:  Diagnosis Date  . Arthritis    lower back  . Diabetes mellitus   . Heart murmur    MVP- causes pt no problems per pt     Current Outpatient Medications:  .  alendronate (FOSAMAX) 70 MG tablet, Take 70 mg by mouth every Monday. Take with a full glass of water on an empty stomach., Disp: , Rfl:  .  aspirin EC 81 MG tablet, Take 81 mg by mouth daily., Disp: , Rfl:  .  B Complex-C (B-COMPLEX WITH VITAMIN C) tablet, Take 1 tablet by mouth daily., Disp: , Rfl:  .  calcium-vitamin D (OSCAL WITH D) 500-200 MG-UNIT tablet, Take 1 tablet by mouth daily., Disp: , Rfl:  .  CINNAMON PO, Take 1 tablet by mouth daily., Disp: , Rfl:  .  clotrimazole (LOTRIMIN) 1 % cream, Apply one fingertip amount to the affected area daily., Disp: 30 g, Rfl: 0 .  Cyanocobalamin (VITAMIN B-12 PO), Take 1 tablet by mouth daily., Disp: , Rfl:  .  fluconazole (DIFLUCAN) 150 MG tablet, Take 1 tablet (150 mg total) by mouth once a week., Disp: 6 tablet, Rfl: 1 .  gemfibrozil (LOPID) 600 MG tablet, Take 600 mg by mouth 2 (two) times daily before a meal. , Disp: , Rfl:  .  GINSENG PO, Take 1 capsule by mouth daily., Disp: , Rfl:  .  Glucomannan (GLUCOMANNAN KONJAC ROOT) 1000 MG CAPS, Take 2,000 mg by mouth at bedtime. , Disp: , Rfl:  .  ibuprofen (ADVIL,MOTRIN) 600 MG tablet, Take 1 tablet (600 mg total) by mouth every 6 (six) hours as needed. (Patient not taking: Reported on 02/27/2017), Disp: 30 tablet, Rfl: 0 .  ketoconazole (NIZORAL) 2 % cream, Apply 1 application topically daily as needed for irritation.,  Disp: , Rfl:  .  latanoprost (XALATAN) 0.005 % ophthalmic solution, Place 1 drop into both eyes at bedtime., Disp: , Rfl:  .  magnesium gluconate (MAGONATE) 500 MG tablet, Take 500 mg by mouth at bedtime., Disp: , Rfl:  .  Melatonin 5 MG CAPS, Take 5 mg by mouth at bedtime., Disp: , Rfl:  .  meloxicam (MOBIC) 7.5 MG tablet, Take 7.5 mg by mouth daily., Disp: , Rfl:  .  metFORMIN (GLUCOPHAGE-XR) 500 MG 24 hr tablet, TAKE 1 TABLET (500 MG TOTAL) BY MOUTH 2 (TWO) TIMES DAILY BEFORE MEALS., Disp: , Rfl:  .  Omega-3 Fatty Acids (FISH OIL) 1200 MG CPDR, Take 1,200 mg by mouth daily. , Disp: , Rfl:  .  omeprazole (PRILOSEC) 40 MG capsule, Take 40 mg by mouth daily. , Disp: , Rfl:  .  oxybutynin (DITROPAN) 5 MG tablet, Take 5 mg by mouth daily as needed for bladder spasms., Disp: , Rfl:  .  polyethylene glycol (MIRALAX / GLYCOLAX) packet, Take 8.5 g by mouth daily., Disp: , Rfl:  .  pregabalin (LYRICA) 75 MG capsule, Take 75 mg by mouth daily., Disp: , Rfl:  .  terbinafine (LAMISIL) 250 MG tablet, Take 1 tablet (250  mg total) by mouth daily., Disp: 90 tablet, Rfl: 0 .  traMADol (ULTRAM) 50 MG tablet, Take 1 tablet (50 mg total) by mouth every 6 (six) hours as needed for moderate pain., Disp: 30 tablet, Rfl: 0 .  TURMERIC PO, Take 1 capsule by mouth daily., Disp: , Rfl:   Social History   Tobacco Use  Smoking Status Former Smoker  . Quit date: 01/02/1998  . Years since quitting: 20.5  Smokeless Tobacco Never Used    Allergies  Allergen Reactions  . Cefdinir Hives and Other (See Comments)    Myalgia   . Penicillins Rash and Other (See Comments)    Has patient had a PCN reaction causing immediate rash, facial/tongue/throat swelling, SOB or lightheadedness with hypotension: No Has patient had a PCN reaction causing severe rash involving mucus membranes or skin necrosis: No Has patient had a PCN reaction that required hospitalization: No Has patient had a PCN reaction occurring within the last 10  years: No If all of the above answers are "NO", then may proceed with Cephalosporin use.    Objective:   Vitals:   06/14/18 1610  Temp: 97.8 F (36.6 C)   There is no height or weight on file to calculate BMI. Constitutional Well developed. Well nourished.  Vascular Dorsalis pedis pulses palpable bilaterally. Posterior tibial pulses palpable bilaterally. Capillary refill normal to all digits.  No cyanosis or clubbing noted. Pedal hair growth normal.  Neurologic Normal speech. Oriented to person, place, and time. Epicritic sensation to light touch grossly present bilaterally.  Dermatologic Nails thickened, yellow discoloration Skin normal  Orthopedic: Normal joint ROM without pain or crepitus bilaterally. No visible deformities. No bony tenderness.   Radiographs: None Assessment:   1. Encounter for long-term (current) use of high-risk medication   2. Nail dystrophy    Plan:  Patient was evaluated and treated and all questions answered.  Onychomycosis -Recheck liver function studies.   -Will Rx Lamisil if LFTs normal.  Return in about 5 weeks (around 07/19/2018) for Nail Fungus.

## 2018-09-11 ENCOUNTER — Telehealth: Payer: Self-pay | Admitting: Orthopedic Surgery

## 2018-09-11 NOTE — Telephone Encounter (Signed)
Tried calling her. No answer. LMVM advising we would need records. Do not have access to them.

## 2018-09-11 NOTE — Telephone Encounter (Signed)
Patient left a voicemail stating that she has an appointment with Dr. Marlou Sa next week and wanted to know if you can see her records from [facility] or does she need to get them to bring with her.  CB#[phone].  Thank you.

## 2018-09-18 ENCOUNTER — Ambulatory Visit: Payer: Medicare HMO | Admitting: Orthopedic Surgery

## 2018-09-30 ENCOUNTER — Encounter: Payer: Self-pay | Admitting: Orthopedic Surgery

## 2018-09-30 ENCOUNTER — Ambulatory Visit (INDEPENDENT_AMBULATORY_CARE_PROVIDER_SITE_OTHER): Payer: Medicare Other | Admitting: Orthopedic Surgery

## 2018-09-30 ENCOUNTER — Ambulatory Visit: Payer: Self-pay

## 2018-09-30 DIAGNOSIS — M25511 Pain in right shoulder: Secondary | ICD-10-CM

## 2018-09-30 DIAGNOSIS — M19011 Primary osteoarthritis, right shoulder: Secondary | ICD-10-CM

## 2018-09-30 NOTE — Progress Notes (Signed)
Office Visit Note   Patient: Rose Smith           Date of Birth: [birth year]           MRN: [id number] Visit Date: 09/30/2018 Requested by: Kelton Pillar, MD [address] [place],  [place] [address] PCP: Kelton Pillar, MD  Subjective: Chief Complaint  Patient presents with  . Right Shoulder - Pain    HPI: Rose Smith is a 78 year old patient with right shoulder pain.  She is had it for 3 to 4 months.  She has been seen by another orthopedic group and they did an injection.  That was done in May.  She has some sporadic pain but no rest pain and no pain that keeps her awake at night.  She also has very good function of the right shoulder.  Denies much in the way of mechanical symptoms.  Had bilateral rotator cuff surgery done about 25 years ago.  Left shoulder does very well without any symptoms.  She does do a lot of planting and gardening.              ROS: All systems reviewed are negative as they relate to the chief complaint within the history of present illness.  Patient denies  fevers or chills.   Assessment & Plan: Visit Diagnoses:  1. Right shoulder pain, unspecified chronicity   2. Arthritis of right shoulder region     Plan: Impression is right shoulder arthritis.  She has very good functional range of motion and only slight weakness on external rotation today.  I think she would do well with an intra-articular glenohumeral joint injection which Dr. Junius Roads will performed today under ultrasound guidance.  I will think she is really quite there yet for replacement.  When that comes to past I think she would do well with a reverse shoulder replacement based on her age and slight weakness of the rotator cuff.  Follow-up with me as needed.  Follow-Up Instructions: Return if symptoms worsen or fail to improve.   Orders:  Orders Placed This Encounter  Procedures  . XR Shoulder Right   No orders of the defined types were placed in this encounter.     Procedures: No procedures performed   Clinical Data: No additional findings.  Objective: Vital Signs: There were no vitals taken for this visit.  Physical Exam:   Constitutional: Patient appears well-developed HEENT:  Head: Normocephalic Eyes:EOM are normal Neck: Normal range of motion Cardiovascular: Normal rate Pulmonary/chest: Effort normal Neurologic: Patient is alert Skin: Skin is warm Psychiatric: Patient has normal mood and affect    Ortho Exam: Ortho exam demonstrates forward flexion to 170 bilaterally.  Rotator cuff strength 5- out of 5 on the right to infraspinatus testing 5+ out of 5 on the left.  She does have a little bit of grinding consistent with known history of arthritis.  Motor sensory function of the hand is intact.  Deltoid is functional.  Subscap strength 5+ out of 5 bilaterally.  Specialty Comments:  No specialty comments available.  Imaging: Xr Shoulder Right  Result Date: 09/30/2018 AP outlet and axillary right shoulder reviewed.  Glenohumeral joint arthritis is present.  Moderate to severe in nature.  No significant posterior erosion visible on somewhat limited axillary view.  Acromiohumeral distance maintained.  No acute fracture.    PMFS History: Patient Active Problem List   Diagnosis Date Noted  . Bursitis of hip 07/26/2011   Past Medical History:  Diagnosis  Date  . Arthritis    lower back  . Diabetes mellitus   . 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 No pertinent family history.  Past Surgical History:  Procedure Laterality Date  . ABDOMINAL HYSTERECTOMY    . CARPAL TUNNEL RELEASE     left   . CHOLECYSTECTOMY N/A 03/14/2017   Procedure: LAPAROSCOPIC CHOLECYSTECTOMY;  Surgeon: Coralie Keens, MD;  Location: [facility];  Service: General;  Laterality: N/A;  . EXCISION/RELEASE BURSA HIP  07/26/2011   Procedure: EXCISION/RELEASE BURSA HIP;  Surgeon: Gearlean Alf, MD;  Location: [facility];  Service:  Orthopedics;  Laterality: Left;  Left Hip Bursectomy with Gluteal Tendon Repair  . ROTATOR CUFF REPAIR     bilateral   . vaginal wall repair      Social History   Occupational History  . Not on file  Tobacco Use  . Smoking status: Former Smoker    Quit date: 01/02/1998    Years since quitting: 20.7  . Smokeless tobacco: Never Used  Substance and Sexual Activity  . Alcohol use: Yes    Comment: occasional   . Drug use: No  . Sexual activity: Not on file

## 2018-09-30 NOTE — Progress Notes (Signed)
Subjective: Patient is here for ultrasound-guided intra-articular right glenohumeral injection.    Objective:  Pain with active ROM, decreased active and passive ROM.  Procedure: Ultrasound-guided right glenohumeral injection: After sterile prep with Betadine, injected 8 cc 1% lidocaine without epinephrine and 40 mg methylprednisolone using a 22-gauge spinal needle, passing the needle through approach into the glenohumeral joint.  Injectate seen filling joint capsule.  Good immediate relief.  Follow up with Dr. Marlou Sa as directed.

## 2019-01-31 ENCOUNTER — Ambulatory Visit: Payer: Medicare Other

## 2019-04-24 ENCOUNTER — Encounter: Payer: Self-pay | Admitting: Family Medicine

## 2019-04-24 ENCOUNTER — Ambulatory Visit: Payer: Medicare Other | Admitting: Family Medicine

## 2019-04-24 ENCOUNTER — Ambulatory Visit: Payer: Self-pay

## 2019-04-24 ENCOUNTER — Other Ambulatory Visit: Payer: Self-pay

## 2019-04-24 DIAGNOSIS — M25511 Pain in right shoulder: Secondary | ICD-10-CM | POA: Diagnosis not present

## 2019-04-24 DIAGNOSIS — M19011 Primary osteoarthritis, right shoulder: Secondary | ICD-10-CM

## 2019-04-24 NOTE — Progress Notes (Signed)
   Office Visit Note   Patient: Rose Smith           Date of Birth: 1940/01/25           MRN: [id number] Visit Date: 04/24/2019 Requested by: Kelton Pillar, MD [address] [place],  [place] [address] PCP: Kelton Pillar, MD  Subjective: Chief Complaint  Patient presents with  . Right Shoulder - Pain    Patient would like another glenohumeral injection in the shoulder - helped last time.    HPI: She is here with recurrent right shoulder pain.  He did well with glenohumeral injection in September.  Symptoms have been very manageable with dry needling per Aart Schulenklopper.  Recently her pain has worsened and she would like to try another injection.              ROS:   All other systems were reviewed and are negative.  Objective: Vital Signs: There were no vitals taken for this visit.  Physical Exam:  General:  Alert and oriented, in no acute distress. Pulm:  Breathing unlabored. Psy:  Normal mood, congruent affect. Skin: No rash Right shoulder: Very limited overhead reach due to pain and stiffness.  Imaging: US Guided Needle Placement - No Linked Charges  Result Date: 04/24/2019 Ultrasound-guided right glenohumeral injection: After sterile prep with Betadine, injected 8 cc 1% lidocaine without epinephrine and 40 mg methylprednisolone using a 22-gauge spinal needle, passing the needle from posterior approach into the glenohumeral joint.  Injectate seen filling joint capsule.  Good immediate relief.     Assessment & Plan: 1.  Right shoulder glenohumeral arthritis -Ultrasound-guided glenohumeral injection given as above.  Can repeat when needed.  If she stops responding, could try dextrose prolotherapy or possibly PRP.     Procedures: No procedures performed  No notes on file     PMFS History: Patient Active Problem List   Diagnosis Date Noted  . Bursitis of hip 07/26/2011   Past Medical History:  Diagnosis Date  . Arthritis    lower back   . Diabetes mellitus   . 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 No pertinent family history.  Past Surgical History:  Procedure Laterality Date  . ABDOMINAL HYSTERECTOMY    . CARPAL TUNNEL RELEASE     left   . CHOLECYSTECTOMY N/A 03/14/2017   Procedure: LAPAROSCOPIC CHOLECYSTECTOMY;  Surgeon: Coralie Keens, MD;  Location: [facility];  Service: General;  Laterality: N/A;  . EXCISION/RELEASE BURSA HIP  07/26/2011   Procedure: EXCISION/RELEASE BURSA HIP;  Surgeon: Gearlean Alf, MD;  Location: [facility];  Service: Orthopedics;  Laterality: Left;  Left Hip Bursectomy with Gluteal Tendon Repair  . ROTATOR CUFF REPAIR     bilateral   . vaginal wall repair      Social History   Occupational History  . Not on file  Tobacco Use  . Smoking status: Former Smoker    Quit date: 01/02/1998    Years since quitting: 21.3  . Smokeless tobacco: Never Used  Substance and Sexual Activity  . Alcohol use: Yes    Comment: occasional   . Drug use: No  . Sexual activity: Not on file

## 2019-06-06 ENCOUNTER — Other Ambulatory Visit: Payer: Self-pay

## 2019-06-06 ENCOUNTER — Ambulatory Visit: Payer: Medicare Other | Admitting: Podiatry

## 2019-06-06 VITALS — Temp 97.0°F

## 2019-06-06 DIAGNOSIS — M2012 Hallux valgus (acquired), left foot: Secondary | ICD-10-CM | POA: Diagnosis not present

## 2019-06-06 DIAGNOSIS — L603 Nail dystrophy: Secondary | ICD-10-CM | POA: Diagnosis not present

## 2019-06-06 DIAGNOSIS — M2011 Hallux valgus (acquired), right foot: Secondary | ICD-10-CM | POA: Diagnosis not present

## 2019-06-06 NOTE — Progress Notes (Signed)
  Subjective:  Patient ID: Rose Smith, female    DOB: [birth year],  MRN: [id number]  Chief Complaint  Patient presents with  . Nail Problem    L hallux. Pt stated, "It hasn't completely returned to normal - still thick and discolored".  . Callouses    Bilateral hallux. Requests trim.    79 y.o. female presents with the above complaint. History confirmed with patient.   Objective:  Physical Exam: warm, good capillary refill, no trophic changes or ulcerative lesions, normal DP and PT pulses and normal sensory exam. Bilateral HAV, hammertoes. Left hallux nail thickening. Bilateral hallux pinch callus  Assessment:   1. Nail dystrophy   2. Acquired hallux valgus of both feet      Plan:  Patient was evaluated and treated and all questions answered.  Onychomycosis -Mostly resolved except for left hallux  Hallux Valgus -Bilateral hallux lesions pared courtesy -Educated on etiology -Avoid correction at this time  Return if symptoms worsen or fail to improve.

## 2019-09-28 ENCOUNTER — Other Ambulatory Visit: Payer: Self-pay | Admitting: Physician Assistant

## 2019-09-28 DIAGNOSIS — E118 Type 2 diabetes mellitus with unspecified complications: Secondary | ICD-10-CM

## 2019-09-28 DIAGNOSIS — Z794 Long term (current) use of insulin: Secondary | ICD-10-CM

## 2019-09-28 DIAGNOSIS — U071 COVID-19: Secondary | ICD-10-CM

## 2019-09-28 NOTE — Progress Notes (Signed)
I connected by phone with Rose Smith on 09/28/2019 at 1:02 PM to discuss the potential use of a new treatment for mild to moderate COVID-19 viral infection in non-hospitalized patients.  This patient is a 79 y.o. female that meets the FDA criteria for Emergency Use Authorization of COVID monoclonal antibody casirivimab/imdevimab.  Has a (+) direct SARS-CoV-2 viral test result  Has mild or moderate COVID-19   Is NOT hospitalized due to COVID-19  Is within 10 days of symptom onset  Has at least one of the high risk factor(s) for progression to severe COVID-19 and/or hospitalization as defined in EUA.  Specific high risk criteria : Older age (>/= 81 yo) and Diabetes   I have spoken and communicated the following to the patient or parent/caregiver regarding COVID monoclonal antibody treatment:  1. FDA has authorized the emergency use for the treatment of mild to moderate COVID-19 in adults and pediatric patients with positive results of direct SARS-CoV-2 viral testing who are 5 years of age and older weighing at least 40 kg, and who are at high risk for progressing to severe COVID-19 and/or hospitalization.  2. The significant known and potential risks and benefits of COVID monoclonal antibody, and the extent to which such potential risks and benefits are unknown.  3. Information on available alternative treatments and the risks and benefits of those alternatives, including clinical trials.  4. Patients treated with COVID monoclonal antibody should continue to self-isolate and use infection control measures (e.g., wear mask, isolate, social distance, avoid sharing personal items, clean and disinfect "high touch" surfaces, and frequent handwashing) according to CDC guidelines.   5. The patient or parent/caregiver has the option to accept or refuse COVID monoclonal antibody treatment.  After reviewing this information with the patient, the patient has agreed to receive one of the  available covid 19 monoclonal antibodies and will be provided an appropriate fact sheet prior to infusion.  Sx onset 9/23. Set up for infusion on 9/27 @ 11:30am.  Directions given to [facility]. Pt is aware that insurance will be charged an infusion fee. Pt is fully vaccinated.   Angelena Form 09/28/2019 1:02 PM

## 2019-09-29 ENCOUNTER — Ambulatory Visit (HOSPITAL_COMMUNITY)
Admission: RE | Admit: 2019-09-29 | Discharge: 2019-09-29 | Disposition: A | Discharge status: Home / Self Care | Payer: Medicare Other | Source: Ambulatory Visit | Attending: Pulmonary Disease | Admitting: Pulmonary Disease

## 2019-09-29 ENCOUNTER — Other Ambulatory Visit (HOSPITAL_COMMUNITY): Payer: Self-pay

## 2019-09-29 ENCOUNTER — Ambulatory Visit: Payer: Medicare Other | Admitting: Orthopedic Surgery

## 2019-09-29 DIAGNOSIS — E118 Type 2 diabetes mellitus with unspecified complications: Secondary | ICD-10-CM | POA: Diagnosis present

## 2019-09-29 DIAGNOSIS — Z23 Encounter for immunization: Secondary | ICD-10-CM | POA: Insufficient documentation

## 2019-09-29 DIAGNOSIS — Z794 Long term (current) use of insulin: Secondary | ICD-10-CM | POA: Diagnosis present

## 2019-09-29 DIAGNOSIS — U071 COVID-19: Secondary | ICD-10-CM | POA: Insufficient documentation

## 2019-09-29 MED ORDER — ALBUTEROL SULFATE HFA 108 (90 BASE) MCG/ACT IN AERS: 2.0000 | INHALATION_SPRAY | Freq: Once | RESPIRATORY_TRACT | Status: DC | PRN

## 2019-09-29 MED ORDER — DIPHENHYDRAMINE HCL 50 MG/ML IJ SOLN: 50.0000 mg | Freq: Once | INTRAMUSCULAR | Status: DC | PRN

## 2019-09-29 MED ORDER — SODIUM CHLORIDE 0.9 % IV SOLN
1200.0000 mg | Freq: Once | INTRAVENOUS | Status: AC
  Administered 2019-09-29: 1200 mg via INTRAVENOUS

## 2019-09-29 MED ORDER — SODIUM CHLORIDE 0.9 % IV SOLN: INTRAVENOUS | Status: DC | PRN

## 2019-09-29 MED ORDER — METHYLPREDNISOLONE SODIUM SUCC 125 MG IJ SOLR: 125.0000 mg | Freq: Once | INTRAMUSCULAR | Status: DC | PRN

## 2019-09-29 MED ORDER — SODIUM CHLORIDE 0.9 % IV SOLN: Freq: Once | INTRAVENOUS | Status: AC

## 2019-09-29 MED ORDER — FAMOTIDINE IN NACL 20-0.9 MG/50ML-% IV SOLN: 20.0000 mg | Freq: Once | INTRAVENOUS | Status: DC | PRN

## 2019-09-29 MED ORDER — EPINEPHRINE 0.3 MG/0.3ML IJ SOAJ: 0.3000 mg | Freq: Once | INTRAMUSCULAR | Status: DC | PRN

## 2019-09-29 NOTE — Discharge Instructions (Signed)

## 2019-10-01 ENCOUNTER — Ambulatory Visit: Payer: Medicare Other | Admitting: Orthopedic Surgery

## 2019-10-15 ENCOUNTER — Encounter: Payer: Self-pay | Admitting: Orthopedic Surgery

## 2019-10-15 ENCOUNTER — Ambulatory Visit: Payer: Medicare Other | Admitting: Orthopedic Surgery

## 2019-10-15 ENCOUNTER — Telehealth: Payer: Self-pay

## 2019-10-15 DIAGNOSIS — M19011 Primary osteoarthritis, right shoulder: Secondary | ICD-10-CM | POA: Diagnosis not present

## 2019-10-15 NOTE — Progress Notes (Signed)
Office Visit Note   Patient: Rose Smith           Date of Birth: 1940-10-29           MRN: [id number] Visit Date: 10/15/2019 Requested by: Kelton Pillar, MD 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  She had a glenohumeral injection with Dr. Junius Roads in April which gave her relief for 2 months.  Shoulder generally is better with rest.  She still has left knee pain.  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  She has not worked in 3 weeks so the shoulder has calm down some.  She is done some dry needling which has helped.  She does have a history of right sided rotator cuff repair in the 1990s and 2 left-sided shoulder rotator cuff repairs since then.              ROS: All systems reviewed are negative as they relate to the chief complaint within the history of present illness.  Patient denies  fevers or chills.   Assessment & Plan: Visit Diagnoses:  1. Arthritis of right shoulder region     Plan: Impression is right shoulder arthritis with reasonably well-maintained range of motion and function.  Question arises as to which joint she should get replaced before the other.  I think in general due to the shoulder requirements for assist to get out of bed and moving with a walker after knee replacement that for a reverse shoulder replacement I would advise knee replacement be done first.  I think in the meantime we could consider Synvisc gel injection off label into the shoulder joint for pain relief.  That has helped several other of my patients.  We will get that arranged and potentially get that done before the end of the year.  Follow-up at that time  Follow-Up Instructions: Return if symptoms worsen or fail to improve.    Orders:  No orders of the defined types were placed in this encounter.  No orders of the defined types were placed in this encounter.     Procedures: No procedures performed   Clinical Data: No additional findings.  Objective: Vital Signs: There were no vitals taken for this visit.  Physical Exam:   Constitutional: Patient appears well-developed HEENT:  Head: Normocephalic Eyes:EOM are normal Neck: Normal range of motion Cardiovascular: Normal rate Pulmonary/chest: Effort normal Neurologic: Patient is alert Skin: Skin is warm Psychiatric: Patient has normal mood and affect    Ortho Exam: Ortho exam demonstrates external rotation on the right-hand side to 25 forward flexion is 120 glenohumeral abduction is about 90.  She has pretty reasonable rotator cuff strength infraspinatus and subscap testing supraspinatus slightly weak at 5- out of 5.  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  No AC joint tenderness.  Deltoid is functional.  No other masses lymphadenopathy or skin changes noted in that shoulder girdle region  Specialty Comments:  No specialty comments available.  Imaging: No results found.   PMFS History: Patient Active Problem List   Diagnosis Date Noted   Bursitis of hip 07/26/2011   Past Medical History:  Diagnosis Date  Arthritis    lower back   Diabetes mellitus    Heart murmur    MVP- causes pt no problems per pt     History reviewed. No pertinent family history.  Past Surgical History:  Procedure Laterality Date   ABDOMINAL HYSTERECTOMY     CARPAL TUNNEL RELEASE     left    CHOLECYSTECTOMY N/A 03/14/2017   Procedure: LAPAROSCOPIC CHOLECYSTECTOMY;  Surgeon: Coralie Keens, MD;  Location: [facility];  Service: General;  Laterality: N/A;   EXCISION/RELEASE BURSA HIP  07/26/2011   Procedure: EXCISION/RELEASE BURSA HIP;  Surgeon: Gearlean Alf, MD;  Location:  [facility];  Service: Orthopedics;  Laterality: Left;  Left Hip Bursectomy with Gluteal Tendon Repair   ROTATOR CUFF REPAIR     bilateral    vaginal wall repair      Social History   Occupational History   Not on file  Tobacco Use   Smoking status: Former Smoker    Quit date: 01/02/1998    Years since quitting: 21.7   Smokeless tobacco: Never Used  Scientific laboratory technician Use: Never used  Substance and Sexual Activity   Alcohol use: Yes    Comment: occasional    Drug use: No   Sexual activity: Not on file

## 2019-10-15 NOTE — Telephone Encounter (Signed)
Needs Synvisc one for her right shoulder. Can we have her pay OOP for this since approved only for knees? Please contact her with OOP cost. Thanks.

## 2019-10-15 NOTE — Telephone Encounter (Signed)
When I asked Rose Smith May about gel injections for the shoulder, she stated that we don't do these any more.

## 2019-10-16 NOTE — Telephone Encounter (Signed)
See below. Please advise. Thanks.  

## 2019-10-22 NOTE — Telephone Encounter (Signed)
I called and lm on vm to advise of message below. To call with questions or if she would like to proceed with injection and understanding it would be 100% out of pocket expense.

## 2019-10-22 NOTE — Telephone Encounter (Signed)
Basically if she wants to pay out-of-pocket for Synvisc 1 we can inject it wherever we want to as long as she understands it is an off label use so essentially I would just tell Rose Smith that if she can come up with the money to buy the gel then we can get it injected and it might help her shoulder

## 2019-12-25 ENCOUNTER — Other Ambulatory Visit: Payer: Self-pay

## 2019-12-25 ENCOUNTER — Ambulatory Visit: Payer: Self-pay

## 2019-12-25 ENCOUNTER — Ambulatory Visit (INDEPENDENT_AMBULATORY_CARE_PROVIDER_SITE_OTHER): Payer: Medicare Other | Admitting: Family Medicine

## 2019-12-25 DIAGNOSIS — M19011 Primary osteoarthritis, right shoulder: Secondary | ICD-10-CM

## 2019-12-25 DIAGNOSIS — M25511 Pain in right shoulder: Secondary | ICD-10-CM | POA: Diagnosis not present

## 2019-12-25 NOTE — Progress Notes (Signed)
Office Visit Note   Patient: Rose Smith           Date of Birth: 06-07-1940           MRN: [id number] Visit Date: 12/25/2019 Requested by: Kelton Pillar, MD [address] [place],  [place] [address] PCP: Kelton Pillar, MD  Subjective: Chief Complaint  Patient presents with  . Right Shoulder - Pain    Requesting another GH cortisone injection    HPI: She is here with recurrent right shoulder pain.  Glenohumeral DJD, trying to avoid replacement.  Injection in April helped for a couple months.  Her knees are doing better with gel injections, so she is wondering whether she should consider shoulder replacement while her knees are still doing well.                ROS:   All other systems were reviewed and are negative.  Objective: Vital Signs: There were no vitals taken for this visit.  Physical Exam:  General:  Alert and oriented, in no acute distress. Pulm:  Breathing unlabored. Psy:  Normal mood, congruent affect.  Right shoulder: Pain with active range of motion.  Imaging: US Guided Needle Placement - No Linked Charges  Result Date: 12/25/2019 Ultrasound guided injection is preferred based studies that show increased duration, increased effect, greater accuracy, decreased procedural pain, increased response rate, and decreased cost with ultrasound guided versus blind injection.   Verbal informed consent obtained.  Time-out conducted.  Noted no overlying erythema, induration, or other signs of local infection. Ultrasound-guided right glenohumeral injection: After sterile prep with Betadine, injected 4 cc 0.25% bupivocaine without epinephrine and 6 mg betamethasone using a 22-gauge spinal needle, passing the needle from posterior approach into the glenohumeral joint.  Injectate seen filling the joint capsule.  Good immediate relief.    Assessment & Plan: 1.  Shoulder glenohumeral arthritis -Steroid injection given as above.  If this starts to wear off,  she wants to investigate cash pay for gel injections.     Procedures: No procedures performed        PMFS History: Patient Active Problem List   Diagnosis Date Noted  . Bursitis of hip 07/26/2011   Past Medical History:  Diagnosis Date  . Arthritis    lower back  . Diabetes mellitus   . 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  Past Surgical History:  Procedure Laterality Date  . ABDOMINAL HYSTERECTOMY    . CARPAL TUNNEL RELEASE     left   . CHOLECYSTECTOMY N/A 03/14/2017   Procedure: LAPAROSCOPIC CHOLECYSTECTOMY;  Surgeon: Coralie Keens, MD;  Location: [facility];  Service: General;  Laterality: N/A;  . EXCISION/RELEASE BURSA HIP  07/26/2011   Procedure: EXCISION/RELEASE BURSA HIP;  Surgeon: Gearlean Alf, MD;  Location: [facility];  Service: Orthopedics;  Laterality: Left;  Left Hip Bursectomy with Gluteal Tendon Repair  . ROTATOR CUFF REPAIR     bilateral   . vaginal wall repair      Social History   Occupational History  . Not on file  Tobacco Use  . Smoking status: Former Smoker    Quit date: 01/02/1998    Years since quitting: 21.9  . Smokeless tobacco: Never Used  Vaping Use  . Vaping Use: Never used  Substance and Sexual Activity  . Alcohol use: Yes    Comment: occasional   . Drug use:  No  . Sexual activity: Not on file

## 2020-01-08 DIAGNOSIS — M47816 Spondylosis without myelopathy or radiculopathy, lumbar region: Secondary | ICD-10-CM | POA: Diagnosis not present

## 2020-01-17 ENCOUNTER — Other Ambulatory Visit (HOSPITAL_COMMUNITY): Payer: Self-pay | Admitting: Emergency Medicine

## 2020-01-17 ENCOUNTER — Emergency Department (HOSPITAL_COMMUNITY)
Admission: EM | Admit: 2020-01-17 | Discharge: 2020-01-17 | Disposition: A | Discharge status: Home Health | Payer: Medicare Other | Attending: Emergency Medicine | Admitting: Emergency Medicine

## 2020-01-17 ENCOUNTER — Emergency Department (HOSPITAL_COMMUNITY): Payer: Medicare Other

## 2020-01-17 ENCOUNTER — Other Ambulatory Visit: Payer: Self-pay

## 2020-01-17 DIAGNOSIS — Z87891 Personal history of nicotine dependence: Secondary | ICD-10-CM | POA: Diagnosis not present

## 2020-01-17 DIAGNOSIS — M25562 Pain in left knee: Secondary | ICD-10-CM | POA: Diagnosis present

## 2020-01-17 DIAGNOSIS — E119 Type 2 diabetes mellitus without complications: Secondary | ICD-10-CM | POA: Diagnosis not present

## 2020-01-17 DIAGNOSIS — R110 Nausea: Secondary | ICD-10-CM | POA: Diagnosis not present

## 2020-01-17 DIAGNOSIS — S82141A Displaced bicondylar fracture of right tibia, initial encounter for closed fracture: Secondary | ICD-10-CM

## 2020-01-17 DIAGNOSIS — S82142A Displaced bicondylar fracture of left tibia, initial encounter for closed fracture: Secondary | ICD-10-CM | POA: Diagnosis not present

## 2020-01-17 DIAGNOSIS — S0993XA Unspecified injury of face, initial encounter: Secondary | ICD-10-CM | POA: Diagnosis not present

## 2020-01-17 DIAGNOSIS — Y92481 Parking lot as the place of occurrence of the external cause: Secondary | ICD-10-CM | POA: Diagnosis not present

## 2020-01-17 DIAGNOSIS — W19XXXA Unspecified fall, initial encounter: Secondary | ICD-10-CM | POA: Diagnosis not present

## 2020-01-17 DIAGNOSIS — Z7401 Bed confinement status: Secondary | ICD-10-CM | POA: Diagnosis not present

## 2020-01-17 DIAGNOSIS — S82201A Unspecified fracture of shaft of right tibia, initial encounter for closed fracture: Secondary | ICD-10-CM | POA: Insufficient documentation

## 2020-01-17 DIAGNOSIS — Z7982 Long term (current) use of aspirin: Secondary | ICD-10-CM | POA: Diagnosis not present

## 2020-01-17 DIAGNOSIS — W010XXA Fall on same level from slipping, tripping and stumbling without subsequent striking against object, initial encounter: Secondary | ICD-10-CM | POA: Diagnosis not present

## 2020-01-17 DIAGNOSIS — S022XXA Fracture of nasal bones, initial encounter for closed fracture: Secondary | ICD-10-CM | POA: Diagnosis not present

## 2020-01-17 DIAGNOSIS — S82109A Unspecified fracture of upper end of unspecified tibia, initial encounter for closed fracture: Secondary | ICD-10-CM | POA: Diagnosis not present

## 2020-01-17 DIAGNOSIS — S82145A Nondisplaced bicondylar fracture of left tibia, initial encounter for closed fracture: Secondary | ICD-10-CM | POA: Diagnosis not present

## 2020-01-17 DIAGNOSIS — M2550 Pain in unspecified joint: Secondary | ICD-10-CM | POA: Diagnosis not present

## 2020-01-17 DIAGNOSIS — S80912A Unspecified superficial injury of left knee, initial encounter: Secondary | ICD-10-CM | POA: Diagnosis present

## 2020-01-17 DIAGNOSIS — R58 Hemorrhage, not elsewhere classified: Secondary | ICD-10-CM | POA: Diagnosis not present

## 2020-01-17 DIAGNOSIS — R5381 Other malaise: Secondary | ICD-10-CM | POA: Diagnosis not present

## 2020-01-17 DIAGNOSIS — Z743 Need for continuous supervision: Secondary | ICD-10-CM | POA: Diagnosis not present

## 2020-01-17 DIAGNOSIS — T07XXXA Unspecified multiple injuries, initial encounter: Secondary | ICD-10-CM | POA: Diagnosis not present

## 2020-01-17 MED ORDER — OXYCODONE-ACETAMINOPHEN 5-325 MG PO TABS
1.0000 | ORAL_TABLET | Freq: Once | ORAL | Status: AC
  Administered 2020-01-17: 1 via ORAL
  Filled 2020-01-17: qty 1

## 2020-01-17 MED ORDER — OXYCODONE-ACETAMINOPHEN 5-325 MG PO TABS: 1.0000 | ORAL_TABLET | Freq: Four times a day (QID) | ORAL | 0 refills | Status: DC | PRN

## 2020-01-17 MED ORDER — TRAMADOL HCL 50 MG PO TABS: 50.0000 mg | ORAL_TABLET | Freq: Once | ORAL | Status: DC

## 2020-01-17 MED FILL — OXYCODONE-APAP 5-325MG: 5-325 | 3 days supply | Qty: 10 | Fill #0

## 2020-01-17 NOTE — Progress Notes (Addendum)
TOC CM/CSW  reached out to Alma Friendly, RN/CM to assist with pts DME needs.  Contact person is pts daughter/Laura [phone].  CSW will continue to follow for dc needs.  Julissa Browning Tarpley-Carter, MSW, LCSW-A Pronouns:  She, Her, Hers                  [facility] ED Transitions of CareClinical Social Worker [email or web address] [phone]

## 2020-01-17 NOTE — ED Triage Notes (Signed)
Tripped and fell down at nail salon. C/o left knee pain and obvious deformity to nose. No LOC and VSS.

## 2020-01-17 NOTE — ED Provider Notes (Signed)
Signout from Dr. Roslynn Amble.  80 year old female trip and fall injuring her left knee and face.  Has a tibial plateau fracture.  Orthopedic Dr. Lyla Glassing recommending knee immobilizer nonweightbearing and outpatient follow-up.  Patient to be getting wheelchair bedside commode and transport home. Physical Exam  BP 118/88   Pulse 78   Temp 98.6 F (37 C) (Oral)   Resp 18   SpO2 99%   Physical Exam  ED Course/Procedures     Procedures  MDM  I was informed that we are unable to secure a wheelchair.  Family is unwilling for her to go home without a wheelchair.  Admin was able to get her a wheelchair.  I sent off a prescription for rolling walker and a wheelchair to another DME company.  We have called [facility] for transport for her to go home.       Hayden Rasmussen, MD 01/18/20 [phone]

## 2020-01-17 NOTE — TOC Progression Note (Signed)
Transition of Care [facility]) - Progression Note    Patient Details  Name: Rose Smith MRN: [id number] Date of Birth: 1940-11-14  Transition of Care [facility]) CM/SW [place], RN Phone Number: 01/17/2020, 4:30 PM Clinical Narrative:    Spoke to CSW MD about this patient. Arranged DME spoke to daughter and patient. They were questioning why the patient could not be admitted, wanted to potentially go to [facility] rehab if they had beds. Otherwise they would go home by [facility], and the daughter would stay with patient and husband ( who has new onset dementia). Patient has weakness in arms, will have difficulty with pushing self in wheelchair,Asked for walker then declined. This CM  Inquired with DME adapt about currier for a wheelchair from [facility] they will call me back on that      Barriers to Discharge: No Barriers Identified  Expected Discharge Plan and Services                                                 Social Determinants of Health (SDOH) Interventions    Readmission Risk Interventions No flowsheet data found.

## 2020-01-17 NOTE — Discharge Instructions (Addendum)
Please schedule follow-up appointment with your orthopedic surgeon on Monday.  Use the knee immobilizer as instructed.  Recommend no weightbearing of your left knee.  Recommend Tylenol and Motrin for pain control.  For breakthrough pain, take Percocet as needed.  For your nose fracture, follow-up with the ENT.

## 2020-01-17 NOTE — ED Notes (Signed)
Ortho at bedside.

## 2020-01-17 NOTE — Progress Notes (Signed)
..   Transition of Care [facility]) - Emergency Department Mini Assessment   Patient Details  Name: Rose Smith MRN: [id number] Date of Birth: 1940-04-24  Transition of Care [facility]) CM/SW Contact:    Gaetano Hawthorne Tarpley-Carter, [place] Phone Number: 01/17/2020, 2:58 PM   Clinical Narrative: [facility] CM/CSW consulted with pt.  Pt is in need of a walker and wheelchair.  Pt states she tripped and fell at nail salon.  Karle Desrosier Tarpley-Carter, MSW, LCSW-A Pronouns:  She, Her, [name] ED Transitions of CareClinical Social Worker [email or web address] [phone]   ED Mini Assessment: What brought you to the Emergency Department? : Pt tripped and fell at nail salon.  Barriers to Discharge: No Barriers Identified     Means of departure: Not know  Interventions which prevented an admission or readmission: DME Provided    Patient Contact and Communications        ,            CMS Medicare.gov Compare Post Acute Care list provided to:: Patient Choice offered to / list presented to : Patient  Admission diagnosis:  fall Patient Active Problem List   Diagnosis Date Noted  . Bursitis of hip 07/26/2011   PCP:  Kelton Pillar, MD Pharmacy:   [facility] [phone] [place], [place] - [address] [place] [address] Phone: [phone] Fax: [phone]  [facility] [address], [place] [address] [place] [address] Phone: [phone] Fax: [phone]

## 2020-01-17 NOTE — Progress Notes (Signed)
Orthopedic Tech Progress Note Patient Details:  Rose Smith July 03, 1940 [id number] Patient unable to use crutches. Ortho Devices Ortho Device/Splint Location: applied knee immobilizer to LLE Ortho Device/Splint Interventions: Ordered,Application   Post Interventions Patient Tolerated: Well   Braulio Bosch 01/17/2020, 2:27 PM

## 2020-01-17 NOTE — ED Provider Notes (Signed)
[facility] DEPT Provider Note   CSN: [id number] Arrival date & time: 01/17/20  1049     History Chief Complaint  Patient presents with  . Fall    Rose Smith is a 80 y.o. female.  Presents here with concern for fall.  Tripped and fell in the parking lot at nail salon.  Fell onto left knee and face.  Having severe knee pain with any movement.  Pain improved with rest sharp shooting pain.  Denies any chest pain or difficulty in breathing, no abdominal pain.  No LOC.  Not on blood thinners.  HPI     Past Medical History:  Diagnosis Date  . Arthritis    lower back  . Diabetes mellitus   . 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 Bursitis of hip 07/26/2011    Past Surgical History:  Procedure Laterality Date  . ABDOMINAL HYSTERECTOMY    . CARPAL TUNNEL RELEASE     left   . CHOLECYSTECTOMY N/A 03/14/2017   Procedure: LAPAROSCOPIC CHOLECYSTECTOMY;  Surgeon: Coralie Keens, MD;  Location: [facility];  Service: General;  Laterality: N/A;  . EXCISION/RELEASE BURSA HIP  07/26/2011   Procedure: EXCISION/RELEASE BURSA HIP;  Surgeon: Gearlean Alf, MD;  Location: [facility];  Service: Orthopedics;  Laterality: Left;  Left Hip Bursectomy with Gluteal Tendon Repair  . ROTATOR CUFF REPAIR     bilateral   . vaginal wall repair        OB History   No obstetric history on file.     No family history on file.  Social History   Tobacco Use  . Smoking status: Former Smoker    Quit date: 01/02/1998    Years since quitting: 22.0  . Smokeless tobacco: Never Used  Vaping Use  . Vaping Use: Never used  Substance Use Topics  . Alcohol use: Yes    Comment: occasional   . Drug use: No    Home Medications Prior to Admission medications   Medication Sig Start Date End Date Taking? Authorizing Provider  alendronate (FOSAMAX) 70 MG tablet Take 70 mg by mouth every Monday. Take with a  full glass of water on an empty stomach.    [provider]  aspirin EC 81 MG tablet Take 81 mg by mouth daily.    [provider]  atorvastatin (LIPITOR) 20 MG tablet Take 20 mg by mouth every other day. 04/08/19   [provider]  B Complex-C (B-COMPLEX WITH VITAMIN C) tablet Take 1 tablet by mouth daily.    [provider]  buPROPion (WELLBUTRIN XL) 150 MG 24 hr tablet Take 150 mg by mouth every morning. 04/16/19   [provider]  calcium-vitamin D (OSCAL WITH D) 500-200 MG-UNIT tablet Take 1 tablet by mouth daily.    [provider]  CINNAMON PO Take 1 tablet by mouth daily.    [provider]  clotrimazole (LOTRIMIN) 1 % cream Apply one fingertip amount to the affected area daily. 11/06/17   Evelina Bucy, DPM  Cyanocobalamin (VITAMIN B-12 PO) Take 1 tablet by mouth daily.    [provider]  diclofenac sodium (VOLTAREN) 1 % GEL diclofenac 1 % topical gel  APPLY 2 GRAMS AA BY TOPICAL ROUTE QID    [provider]  fluconazole (DIFLUCAN) 150 MG tablet Take 1 tablet (150 mg total) by mouth  once a week. 11/08/17   Evelina Bucy, DPM  fluticasone (FLONASE) 50 MCG/ACT nasal spray Place into the nose. 01/16/12   [provider]  gemfibrozil (LOPID) 600 MG tablet Take 600 mg by mouth 2 (two) times daily before a meal.  05/03/13   [provider]  Ginkgo Biloba 40 MG TABS Take by mouth.    [provider]  GINSENG PO Take 1 capsule by mouth daily.    [provider]  Glucomannan (GLUCOMANNAN KONJAC ROOT) 1000 MG CAPS Take 2,000 mg by mouth at bedtime.     [provider]  ibuprofen (ADVIL,MOTRIN) 600 MG tablet Take 1 tablet (600 mg total) by mouth every 6 (six) hours as needed. 09/02/15   Isla Pence, MD  Influenza Vac A&B Surf Ant Adj (FLUAD) 0.5 ML SUSY Fluad 2019-20 61yr up(PF)45 mcg(15 mcgx3)/0.5 mL intramuscular syringe  ADM 0.5ML IM UTD    [provider]   ketoconazole (NIZORAL) 2 % cream Apply 1 application topically daily as needed for irritation.    [provider]  latanoprost (XALATAN) 0.005 % ophthalmic solution Place 1 drop into both eyes at bedtime.    [provider]  magnesium gluconate (MAGONATE) 500 MG tablet Take 500 mg by mouth at bedtime.    [provider]  Melatonin 5 MG CAPS Take 5 mg by mouth at bedtime.    [provider]  meloxicam (MOBIC) 7.5 MG tablet Take 7.5 mg by mouth daily.    [provider]  metFORMIN (GLUCOPHAGE-XR) 500 MG 24 hr tablet TAKE 1 TABLET (500 MG TOTAL) BY MOUTH 2 (TWO) TIMES DAILY BEFORE MEALS. 05/07/13   [provider]  Omega-3 Fatty Acids (FISH OIL) 1200 MG CPDR Take 1,200 mg by mouth daily.     [provider]  omeprazole (PRILOSEC) 40 MG capsule Take 40 mg by mouth daily.  06/11/13   [provider]  oxybutynin (DITROPAN) 5 MG tablet Take 5 mg by mouth daily as needed for bladder spasms.    [provider]  polyethylene glycol (MIRALAX / GLYCOLAX) packet Take 8.5 g by mouth daily.    [provider]  pregabalin (LYRICA) 75 MG capsule Take 75 mg by mouth daily.    [provider]  Tdap (ADACEL) 05-03-13.5 LF-MCG/0.5 injection Inject into the muscle. 07/13/14   [provider]  traMADol (ULTRAM) 50 MG tablet Take 1 tablet (50 mg total) by mouth every 6 (six) hours as needed for moderate pain. 03/14/17   Coralie Keens, MD  TURMERIC PO Take 1 capsule by mouth daily.    [provider]  Vitamin D, Ergocalciferol, (DRISDOL) 1.25 MG (50000 UNIT) CAPS capsule Take 50,000 Units by mouth once a week. 04/24/19   [provider]    Allergies    Cefdinir, Yeast-related products, and Penicillins  Review of Systems   Review of Systems  Constitutional: Negative for chills and fever.  HENT: Negative for ear pain and sore throat.   Eyes: Negative for pain and visual disturbance.  Respiratory:  Negative for cough and shortness of breath.   Cardiovascular: Negative for chest pain and palpitations.  Gastrointestinal: Negative for abdominal pain and vomiting.  Genitourinary: Negative for dysuria and hematuria.  Musculoskeletal: Positive for arthralgias. Negative for back pain.  Skin: Negative for color change and rash.  Neurological: Negative for seizures and syncope.  All other systems reviewed and are negative.   Physical Exam Updated Vital Signs BP 118/88   Pulse 78  Temp 98.6 F (37 C) (Oral)   Resp 18   SpO2 99%   Physical Exam Vitals and nursing note reviewed.  Constitutional:      General: She is not in acute distress.    Appearance: She is well-developed and well-nourished.  HENT:     Head: Normocephalic.     Comments: Superficial abrasion over bridge of nose, generalized nasal swelling, no nasal septal hematoma Eyes:     Conjunctiva/sclera: Conjunctivae normal.  Cardiovascular:     Rate and Rhythm: Normal rate and regular rhythm.     Heart sounds: No murmur heard.   Pulmonary:     Effort: Pulmonary effort is normal. No respiratory distress.     Breath sounds: Normal breath sounds.  Abdominal:     Palpations: Abdomen is soft.     Tenderness: There is no abdominal tenderness.  Musculoskeletal:        General: No edema.     Cervical back: Neck supple.     Comments: Back: no C, T, L spine TTP, no step off or deformity RUE: no TTP throughout, no deformity, normal joint ROM, radial pulse intact, distal sensation and motor intact LUE: no TTP throughout, no deformity, normal joint ROM, radial pulse intact, distal sensation and motor intact RLE:  no TTP throughout, no deformity, normal joint ROM, distal pulse, sensation and motor intact LLE: Tenderness noted over knee, mild swelling of knee, distal pulse, sensation and motor intact  Skin:    General: Skin is warm and dry.     Capillary Refill: Capillary refill takes less than 2 seconds.  Neurological:      General: No focal deficit present.     Mental Status: She is alert and oriented to person, place, and time.  Psychiatric:        Mood and Affect: Mood and affect and mood normal.        Behavior: Behavior normal.     ED Results / Procedures / Treatments   Labs (all labs ordered are listed, but only abnormal results are displayed) Labs Reviewed - No data to display  EKG None  Radiology CT Head Wo Contrast  Result Date: 01/17/2020 CLINICAL DATA:  Facial trauma after fall. EXAM: CT HEAD WITHOUT CONTRAST CT MAXILLOFACIAL WITHOUT CONTRAST TECHNIQUE: Multidetector CT imaging of the head and maxillofacial structures were performed using the standard protocol without intravenous contrast. Multiplanar CT image reconstructions of the maxillofacial structures were also generated. COMPARISON:  September 02, 2015. FINDINGS: CT HEAD FINDINGS Brain: Mild chronic ischemic white matter disease is noted. No mass effect or midline shift is noted. Ventricular size is within normal limits. There is no evidence of mass lesion, hemorrhage or acute infarction. Vascular: No hyperdense vessel or unexpected calcification. Skull: Normal. Negative for fracture or focal lesion. Other: None. CT MAXILLOFACIAL FINDINGS Osseous: Moderately displaced and comminuted nasal bone fracture is noted. Orbits: Negative. No traumatic or inflammatory finding. Sinuses: Mild amount of free fluid is noted in the left maxillary sinus which may represent inflammation. Soft tissues: Negative. IMPRESSION: 1. Mild chronic ischemic white matter disease. No acute intracranial abnormality seen. 2. Moderately displaced and comminuted nasal bone fracture is noted. Electronically Signed   By: Lupita Raider M.D.   On: 01/17/2020 13:54   CT Knee Left Wo Contrast  Result Date: 01/17/2020 CLINICAL DATA:  The patient suffered a left tibial fracture in a fall today. Initial encounter. EXAM: CT OF THE LEFT KNEE WITHOUT CONTRAST TECHNIQUE: Multidetector CT  imaging of the left  knee was performed according to the standard protocol. Multiplanar CT image reconstructions were also generated. COMPARISON:  Plain films left knee earlier today. FINDINGS: Bones/Joint/Cartilage As seen on the comparison plain films, the patient has a nondisplaced fracture of the lateral tibial plateau. The fracture line exits the anterior cortex of the tibia approximately 3 cm below the plateau. No other fracture is identified. Joint spaces are preserved. There is some osteophytosis about the knee. Small lipohemarthrosis noted. Ligaments Suboptimally assessed by CT. As visualized by CT scan, the cruciate and collateral ligaments appear intact. Muscles and Tendons Appear normal. Soft tissues Negative. IMPRESSION: Nondisplaced lateral tibial plateau fracture consistent with a Schatzker type 1 injury. Mild osteoarthritis. Electronically Signed   By: Inge Rise M.D.   On: 01/17/2020 15:02   DG Knee Complete 4 Views Left  Result Date: 01/17/2020 CLINICAL DATA:  Trip and fall with persistent anterior knee pain. EXAM: LEFT KNEE - COMPLETE 4+ VIEW COMPARISON:  None. FINDINGS: There is an acute nondisplaced fracture involving the lateral aspect of the tibial plateau with extension to involve the lateral compartment of the knee. This finding is associated with an expected lipohemarthrosis. No additional fractures identified. Moderate tricompartmental degenerative change of the knee with joint space loss, subchondral sclerosis and osteophytosis. Enthesopathic change involving the superior pole of the patella. Expected adjacent soft tissue swelling.  No radiopaque foreign body. IMPRESSION: Acute, nondisplaced fracture involving the weight-bearing surface of lateral aspect of the tibial plateau with an expected lipohemarthrosis. Electronically Signed   By: Sandi Mariscal M.D.   On: 01/17/2020 13:59   CT Maxillofacial Wo Contrast  Result Date: 01/17/2020 CLINICAL DATA:  Facial trauma after fall.  EXAM: CT HEAD WITHOUT CONTRAST CT MAXILLOFACIAL WITHOUT CONTRAST TECHNIQUE: Multidetector CT imaging of the head and maxillofacial structures were performed using the standard protocol without intravenous contrast. Multiplanar CT image reconstructions of the maxillofacial structures were also generated. COMPARISON:  September 02, 2015. FINDINGS: CT HEAD FINDINGS Brain: Mild chronic ischemic white matter disease is noted. No mass effect or midline shift is noted. Ventricular size is within normal limits. There is no evidence of mass lesion, hemorrhage or acute infarction. Vascular: No hyperdense vessel or unexpected calcification. Skull: Normal. Negative for fracture or focal lesion. Other: None. CT MAXILLOFACIAL FINDINGS Osseous: Moderately displaced and comminuted nasal bone fracture is noted. Orbits: Negative. No traumatic or inflammatory finding. Sinuses: Mild amount of free fluid is noted in the left maxillary sinus which may represent inflammation. Soft tissues: Negative. IMPRESSION: 1. Mild chronic ischemic white matter disease. No acute intracranial abnormality seen. 2. Moderately displaced and comminuted nasal bone fracture is noted. Electronically Signed   By: Marijo Conception M.D.   On: 01/17/2020 13:54    Procedures Procedures (including critical care time)  Medications Ordered in ED Medications  traMADol (ULTRAM) tablet 50 mg (has no administration in time range)  oxyCODONE-acetaminophen (PERCOCET/ROXICET) 5-325 MG per tablet 1 tablet (1 tablet Oral Given 01/17/20 1403)    ED Course  I have reviewed the triage vital signs and the nursing notes.  Pertinent labs & imaging results that were available during my care of the patient were reviewed by me and considered in my medical decision making (see chart for details).    MDM Rules/Calculators/A&P                         80 year old with mechanical fall concern for facial trauma and left knee trauma.  Well-appearing and stable vitals.  CT max  face concerning for moderately displaced and comminuted nasal bone fracture.  X-ray concerning for tibial plateau fracture.  Patient reports she is a patient of Dr. Wynelle Link.  Reviewed with on-call for [facility], Dr. Lyla Glassing.  He recommends knee immobilizer, nonweightbearing and close outpatient follow-up.  Consulted case management who will arrange for wheelchair, bedside commode.  Recommended outpatient ENT follow-up for nasal fracture.    After the discussed management above, the patient was determined to be safe for discharge.  The patient was in agreement with this plan and all questions regarding their care were answered.  ED return precautions were discussed and the patient will return to the ED with any significant worsening of condition.   Final Clinical Impression(s) / ED Diagnoses Final diagnoses:  Closed fracture of right tibial plateau, initial encounter  Closed fracture of nasal bone, initial encounter    Rx / DC Orders ED Discharge Orders    None       Lucrezia Starch, MD 01/17/20 1544

## 2020-01-17 NOTE — Progress Notes (Addendum)
Received call from RN, she reports that pt needs a W/C and a walker. [facility] delivered the walker, but they don't have a W/C and the family is refusing to take pt home without a W/C. Daughter wants pt to be admitted. Contacted Karmen Bongo with [facility] and she reports that she informed the family that they can deliver the W/C tomorrow but it depends on the weather or Monday for sure. Spoke to Dr. Melina Copa and informed him of above. Informed him that we can call [facility] for transportation. He reports that the family agreed with the ambulance but they want the W/C. Will continue to f/u to assist with the D/C plan.

## 2020-01-19 DIAGNOSIS — S82109A Unspecified fracture of upper end of unspecified tibia, initial encounter for closed fracture: Secondary | ICD-10-CM | POA: Diagnosis not present

## 2020-01-20 DIAGNOSIS — S82101A Unspecified fracture of upper end of right tibia, initial encounter for closed fracture: Secondary | ICD-10-CM | POA: Diagnosis not present

## 2020-01-21 DIAGNOSIS — S022XXD Fracture of nasal bones, subsequent encounter for fracture with routine healing: Secondary | ICD-10-CM | POA: Diagnosis not present

## 2020-01-21 DIAGNOSIS — J343 Hypertrophy of nasal turbinates: Secondary | ICD-10-CM | POA: Diagnosis not present

## 2020-01-27 DIAGNOSIS — Z87891 Personal history of nicotine dependence: Secondary | ICD-10-CM | POA: Diagnosis not present

## 2020-01-27 DIAGNOSIS — J343 Hypertrophy of nasal turbinates: Secondary | ICD-10-CM | POA: Diagnosis not present

## 2020-01-27 DIAGNOSIS — S022XXA Fracture of nasal bones, initial encounter for closed fracture: Secondary | ICD-10-CM | POA: Diagnosis not present

## 2020-01-30 ENCOUNTER — Telehealth: Payer: Self-pay | Admitting: *Deleted

## 2020-01-30 NOTE — Telephone Encounter (Signed)
TOC CM received call from pt and stated she received a bill from [place] for a wheelchair but she did not receive wheelchair. She did reach out to [employer] and the ticket only has [place] on it. TOC CM left message with [employer] to follow up on wheelchair. Contacted pt and provided TOC CM. She will give [employer] a call to get more details. Pt was at [facility] and wheelchair was delivered to [facility]. Jonnie Finner RN CCM, [facility] ED TOC CM [phone]  TOC CM spoke to [place] delivered a 3n1 bedside commode to pt at [employer]. States she will take wheelchair off her order. [place], [facility] ED TOC CM [phone]

## 2020-02-12 DIAGNOSIS — S82202D Unspecified fracture of shaft of left tibia, subsequent encounter for closed fracture with routine healing: Secondary | ICD-10-CM | POA: Diagnosis not present

## 2020-02-19 DIAGNOSIS — S82109A Unspecified fracture of upper end of unspecified tibia, initial encounter for closed fracture: Secondary | ICD-10-CM | POA: Diagnosis not present

## 2020-03-10 DIAGNOSIS — E785 Hyperlipidemia, unspecified: Secondary | ICD-10-CM | POA: Diagnosis not present

## 2020-03-10 DIAGNOSIS — N183 Chronic kidney disease, stage 3 unspecified: Secondary | ICD-10-CM | POA: Diagnosis not present

## 2020-03-10 DIAGNOSIS — E1121 Type 2 diabetes mellitus with diabetic nephropathy: Secondary | ICD-10-CM | POA: Diagnosis not present

## 2020-03-10 DIAGNOSIS — M8580 Other specified disorders of bone density and structure, unspecified site: Secondary | ICD-10-CM | POA: Diagnosis not present

## 2020-03-12 DIAGNOSIS — S82142D Displaced bicondylar fracture of left tibia, subsequent encounter for closed fracture with routine healing: Secondary | ICD-10-CM | POA: Diagnosis not present

## 2020-03-18 DIAGNOSIS — S82109A Unspecified fracture of upper end of unspecified tibia, initial encounter for closed fracture: Secondary | ICD-10-CM | POA: Diagnosis not present

## 2020-04-01 ENCOUNTER — Other Ambulatory Visit: Payer: Self-pay

## 2020-04-01 ENCOUNTER — Ambulatory Visit: Payer: Self-pay

## 2020-04-01 ENCOUNTER — Ambulatory Visit (INDEPENDENT_AMBULATORY_CARE_PROVIDER_SITE_OTHER): Payer: Medicare Other | Admitting: Family Medicine

## 2020-04-01 DIAGNOSIS — M25511 Pain in right shoulder: Secondary | ICD-10-CM | POA: Diagnosis not present

## 2020-04-01 DIAGNOSIS — M19011 Primary osteoarthritis, right shoulder: Secondary | ICD-10-CM

## 2020-04-01 MED ORDER — TRAMADOL HCL 50 MG PO TABS
50.0000 mg | ORAL_TABLET | Freq: Four times a day (QID) | ORAL | 0 refills | Status: DC | PRN
Start: 2020-04-01 — End: 2021-01-25

## 2020-04-01 NOTE — Progress Notes (Signed)
Office Visit Note   Patient: Rose Smith           Date of Birth: 05/23/1940           MRN: [id number] Visit Date: 04/01/2020 Requested by: Kelton Pillar, MD [address] [place],  [place] [address] PCP: Kelton Pillar, MD  Subjective: Chief Complaint  Patient presents with  . Right Shoulder - Pain    Requests an injection. Last had a [place] cortisone injection 12/25/19.    HPI: She is here with recurrent right shoulder pain.  Since last visit she fell and broke her left tibia as well as her nose.  She is recovering from those injuries.  Her last injection in the shoulder in December helped until a few weeks ago.  Both of her knees are doing well from an arthritis standpoint.              ROS:   All other systems were reviewed and are negative.  Objective: Vital Signs: There were no vitals taken for this visit.  Physical Exam:  General:  Alert and oriented, in no acute distress. Pulm:  Breathing unlabored. Psy:  Normal mood, congruent affect  Right shoulder: Pain with overhead reach.   Imaging: US Guided Needle Placement - No Linked Charges  Result Date: 04/01/2020  Ultrasound guided injection is preferred based studies that show increased duration, increased effect, greater accuracy, decreased procedural pain, increased response rate, and decreased cost with ultrasound guided versus blind injection.   Verbal informed consent obtained.  Time-out conducted.  Noted no overlying erythema, induration, or other signs of local infection. Ultrasound-guided right glenohumeral injection: After sterile prep with Betadine, injected 4 cc 0.25% bupivocaine without epinephrine and 6 mg betamethasone using a 22-gauge spinal needle, passing the needle from posterior approach into the glenohumeral joint.  Injectate seen filling joint capsule.  Good immediate relief.     Assessment & Plan: 1.  Right shoulder glenohumeral DJD -Injection as above.  Follow-up as needed.   Tramadol as needed.     Procedures: No procedures performed        PMFS History: Patient Active Problem List   Diagnosis Date Noted  . Bursitis of hip 07/26/2011   Past Medical History:  Diagnosis Date  . Arthritis    lower back  . Diabetes mellitus   . 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  Past Surgical History:  Procedure Laterality Date  . ABDOMINAL HYSTERECTOMY    . CARPAL TUNNEL RELEASE     left   . CHOLECYSTECTOMY N/A 03/14/2017   Procedure: LAPAROSCOPIC CHOLECYSTECTOMY;  Surgeon: Coralie Keens, MD;  Location: [facility];  Service: General;  Laterality: N/A;  . EXCISION/RELEASE BURSA HIP  07/26/2011   Procedure: EXCISION/RELEASE BURSA HIP;  Surgeon: Gearlean Alf, MD;  Location: [facility];  Service: Orthopedics;  Laterality: Left;  Left Hip Bursectomy with Gluteal Tendon Repair  . ROTATOR CUFF REPAIR     bilateral   . vaginal wall repair      Social History   Occupational History  . Not on file  Tobacco Use  . Smoking status: Former Smoker    Quit date: 01/02/1998    Years since quitting: 22.2  . Smokeless tobacco: Never Used  Vaping Use  . Vaping Use: Never used  Substance and Sexual Activity  . Alcohol use: Yes    Comment: occasional   .  Drug use: No  . Sexual activity: Not on file

## 2020-04-07 DIAGNOSIS — M79606 Pain in leg, unspecified: Secondary | ICD-10-CM | POA: Diagnosis not present

## 2020-04-09 DIAGNOSIS — S82202D Unspecified fracture of shaft of left tibia, subsequent encounter for closed fracture with routine healing: Secondary | ICD-10-CM | POA: Diagnosis not present

## 2020-04-12 DIAGNOSIS — H401131 Primary open-angle glaucoma, bilateral, mild stage: Secondary | ICD-10-CM | POA: Diagnosis not present

## 2020-04-13 DIAGNOSIS — M25562 Pain in left knee: Secondary | ICD-10-CM | POA: Diagnosis not present

## 2020-04-16 DIAGNOSIS — M25562 Pain in left knee: Secondary | ICD-10-CM | POA: Diagnosis not present

## 2020-04-19 DIAGNOSIS — M25562 Pain in left knee: Secondary | ICD-10-CM | POA: Diagnosis not present

## 2020-04-22 DIAGNOSIS — M25562 Pain in left knee: Secondary | ICD-10-CM | POA: Diagnosis not present

## 2020-04-27 DIAGNOSIS — M5416 Radiculopathy, lumbar region: Secondary | ICD-10-CM | POA: Diagnosis not present

## 2020-05-18 DIAGNOSIS — J0140 Acute pansinusitis, unspecified: Secondary | ICD-10-CM | POA: Diagnosis not present

## 2020-05-18 DIAGNOSIS — M5416 Radiculopathy, lumbar region: Secondary | ICD-10-CM | POA: Diagnosis not present

## 2020-05-26 DIAGNOSIS — J0100 Acute maxillary sinusitis, unspecified: Secondary | ICD-10-CM | POA: Diagnosis not present

## 2020-06-02 DIAGNOSIS — E119 Type 2 diabetes mellitus without complications: Secondary | ICD-10-CM | POA: Diagnosis not present

## 2020-06-03 ENCOUNTER — Telehealth: Payer: Self-pay | Admitting: Orthopedic Surgery

## 2020-06-03 DIAGNOSIS — M19011 Primary osteoarthritis, right shoulder: Secondary | ICD-10-CM

## 2020-06-03 NOTE — Telephone Encounter (Signed)
Patient calling to schedule right total shoulder. She is also asking the recovery time projected for this surgery.  Seen by Dr. Marlou Sa in October of 2021.  Patient said she has not had a scan yet.  If surgery is in order please provide sheet.    Pt cb  [phone]

## 2020-06-03 NOTE — Telephone Encounter (Signed)
Please advise 

## 2020-06-04 NOTE — Telephone Encounter (Signed)
Pls order thinn cut ct for affected right shoulder - debbie has sheet

## 2020-06-04 NOTE — Telephone Encounter (Signed)
ordered

## 2020-06-17 ENCOUNTER — Ambulatory Visit
Admission: RE | Admit: 2020-06-17 | Discharge: 2020-06-17 | Disposition: A | Discharge status: Home / Self Care | Payer: Medicare Other | Source: Ambulatory Visit | Attending: Orthopedic Surgery | Admitting: Orthopedic Surgery

## 2020-06-17 ENCOUNTER — Other Ambulatory Visit: Payer: Self-pay

## 2020-06-17 DIAGNOSIS — M19011 Primary osteoarthritis, right shoulder: Secondary | ICD-10-CM

## 2020-06-17 DIAGNOSIS — Z01818 Encounter for other preprocedural examination: Secondary | ICD-10-CM | POA: Diagnosis not present

## 2020-06-21 DIAGNOSIS — M25562 Pain in left knee: Secondary | ICD-10-CM | POA: Diagnosis not present

## 2020-06-22 DIAGNOSIS — H2513 Age-related nuclear cataract, bilateral: Secondary | ICD-10-CM | POA: Diagnosis not present

## 2020-06-22 DIAGNOSIS — H401111 Primary open-angle glaucoma, right eye, mild stage: Secondary | ICD-10-CM | POA: Diagnosis not present

## 2020-06-22 DIAGNOSIS — H401122 Primary open-angle glaucoma, left eye, moderate stage: Secondary | ICD-10-CM | POA: Diagnosis not present

## 2020-06-22 DIAGNOSIS — E119 Type 2 diabetes mellitus without complications: Secondary | ICD-10-CM | POA: Diagnosis not present

## 2020-06-22 DIAGNOSIS — H43813 Vitreous degeneration, bilateral: Secondary | ICD-10-CM | POA: Diagnosis not present

## 2020-06-24 DIAGNOSIS — M546 Pain in thoracic spine: Secondary | ICD-10-CM | POA: Diagnosis not present

## 2020-06-24 DIAGNOSIS — M5459 Other low back pain: Secondary | ICD-10-CM | POA: Diagnosis not present

## 2020-06-24 DIAGNOSIS — M47816 Spondylosis without myelopathy or radiculopathy, lumbar region: Secondary | ICD-10-CM | POA: Diagnosis not present

## 2020-07-09 ENCOUNTER — Telehealth: Payer: Self-pay | Admitting: Family Medicine

## 2020-07-09 NOTE — Telephone Encounter (Signed)
Please advise 

## 2020-07-09 NOTE — Telephone Encounter (Signed)
I called and advised the patient of this.

## 2020-07-09 NOTE — Telephone Encounter (Signed)
Pt called wondering if there are any studies she can go to for stem calls implantation instead of shoulder surgery?  CB [phone]

## 2020-07-16 DIAGNOSIS — M47814 Spondylosis without myelopathy or radiculopathy, thoracic region: Secondary | ICD-10-CM | POA: Diagnosis not present

## 2020-07-16 DIAGNOSIS — M5459 Other low back pain: Secondary | ICD-10-CM | POA: Diagnosis not present

## 2020-07-21 ENCOUNTER — Ambulatory Visit (INDEPENDENT_AMBULATORY_CARE_PROVIDER_SITE_OTHER): Payer: Medicare Other | Admitting: Orthopedic Surgery

## 2020-07-21 ENCOUNTER — Other Ambulatory Visit: Payer: Self-pay

## 2020-07-21 DIAGNOSIS — M19011 Primary osteoarthritis, right shoulder: Secondary | ICD-10-CM

## 2020-07-23 DIAGNOSIS — K5904 Chronic idiopathic constipation: Secondary | ICD-10-CM | POA: Diagnosis not present

## 2020-07-25 ENCOUNTER — Encounter: Payer: Self-pay | Admitting: Orthopedic Surgery

## 2020-07-25 MED ORDER — LIDOCAINE HCL 1 % IJ SOLN
5.0000 mL | INTRAMUSCULAR | Status: AC | PRN
  Administered 2020-07-21: 5 mL

## 2020-07-25 MED ORDER — METHYLPREDNISOLONE ACETATE 40 MG/ML IJ SUSP
40.0000 mg | INTRAMUSCULAR | Status: AC | PRN
  Administered 2020-07-21: 40 mg via INTRA_ARTICULAR

## 2020-07-25 MED ORDER — BUPIVACAINE HCL 0.5 % IJ SOLN
9.0000 mL | INTRAMUSCULAR | Status: AC | PRN
  Administered 2020-07-21: 9 mL via INTRA_ARTICULAR

## 2020-07-25 NOTE — Progress Notes (Signed)
Office Visit Note   Patient: Rose Smith           Date of Birth: 15-Apr-1940           MRN: [id number] Visit Date: 07/21/2020 Requested by: Kelton Pillar, MD 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  She has a history of insulin-dependent diabetes with hemoglobin A1c 6.2.  No cardiac issues.  She has husband at home along with 2 daughters in the region.  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  CT scan is reviewed and it shows adequate bone stock for reverse shoulder replacement.              ROS: All systems reviewed are negative as they relate to the chief complaint within the history of present illness.  Patient denies  fevers or chills.   Assessment & Plan: Visit Diagnoses:  1. Arthritis of right shoulder region     Plan: Impression is bilateral shoulder rotator cuff arthropathy right worse than left.  Plan is reverse shoulder replacement.  Models utilized to discussed the risk and benefits of the procedure.  They include not limited to infection nerve vessel damage instability as well as incomplete restoration of desired function and incomplete pain relief.  Patient understands risk and benefits along with the nature of the rehabilitative process.  All questions answered.  Plan to perform right shoulder injection today for temporary pain relief.  Anticipate surgery mid October which is 3 months from this date.  All questions answered  Follow-Up Instructions: No follow-ups on file.   Orders:  No orders of the defined types were placed in this encounter.  No orders of the defined types were placed in  this encounter.     Procedures: No procedures performed   Clinical Data: No additional findings.  Objective: Vital Signs: There were no vitals taken for this visit.  Physical Exam:   Constitutional: Patient appears well-developed HEENT:  Head: Normocephalic Eyes:EOM are normal Neck: Normal range of motion Cardiovascular: Normal rate Pulmonary/chest: Effort normal Neurologic: Patient is alert Skin: Skin is warm Psychiatric: Patient has normal mood and affect   Ortho Exam: Ortho exam demonstrates good cervical spine range of motion.  Deltoid is functional bilaterally.  Range of motion right shoulder passively is 25/95/140.  Patient has diminished infraspinatus supraspinatus strength on the right.  Motor sensory function hands intact.  Neck range of motion is full.  Specialty Comments:  No specialty comments available.  Imaging: No results found.   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 No pertinent family history.  Past Surgical History:  Procedure Laterality Date   ABDOMINAL HYSTERECTOMY     CARPAL TUNNEL RELEASE     left    CHOLECYSTECTOMY N/A 03/14/2017   Procedure: LAPAROSCOPIC CHOLECYSTECTOMY;  Surgeon: Coralie Keens, MD;  Location:  [facility];  Service: General;  Laterality: N/A;   EXCISION/RELEASE BURSA HIP  07/26/2011   Procedure: EXCISION/RELEASE BURSA HIP;  Surgeon: Gearlean Alf, MD;  Location: [facility];  Service: Orthopedics;  Laterality: Left;  Left Hip Bursectomy with Gluteal Tendon Repair   ROTATOR CUFF REPAIR     bilateral    vaginal wall repair      Social History   Occupational History   Not on file  Tobacco Use   Smoking status: Former    Types: Cigarettes    Quit date: 01/02/1998    Years since quitting: 22.5    Smokeless tobacco: Never  Vaping Use   Vaping Use: Never used  Substance and Sexual Activity   Alcohol use: Yes    Comment: occasional    Drug use: No   Sexual activity: Not on file

## 2020-07-25 NOTE — Progress Notes (Signed)
   Procedure Note  Patient: Rose Smith             Date of Birth: 08/02/1940           MRN: [id number]             Visit Date: 07/21/2020  Procedures: Visit Diagnoses:  1. Arthritis of right shoulder region     Large Joint Inj: R glenohumeral on 07/21/2020 6:11 PM Indications: diagnostic evaluation and pain Details: 18 G 1.5 in needle, posterior approach  Arthrogram: No  Medications: 9 mL bupivacaine 0.5 %; 40 mg methylPREDNISolone acetate 40 MG/ML; 5 mL lidocaine 1 % Outcome: tolerated well, no immediate complications Procedure, treatment alternatives, risks and benefits explained, specific risks discussed. Consent was given by the patient. Immediately prior to procedure a time out was called to verify the correct patient, procedure, equipment, support staff and site/side marked as required. Patient was prepped and draped in the usual sterile fashion.

## 2020-07-27 ENCOUNTER — Ambulatory Visit
Admission: RE | Admit: 2020-07-27 | Discharge: 2020-07-27 | Disposition: A | Discharge status: Home / Self Care | Payer: Medicare Other | Source: Ambulatory Visit | Attending: Physician Assistant | Admitting: Physician Assistant

## 2020-07-27 ENCOUNTER — Other Ambulatory Visit: Payer: Self-pay | Admitting: Physician Assistant

## 2020-07-27 DIAGNOSIS — K5900 Constipation, unspecified: Secondary | ICD-10-CM | POA: Diagnosis not present

## 2020-07-27 DIAGNOSIS — R109 Unspecified abdominal pain: Secondary | ICD-10-CM | POA: Diagnosis not present

## 2020-07-27 DIAGNOSIS — K5904 Chronic idiopathic constipation: Secondary | ICD-10-CM

## 2020-07-27 DIAGNOSIS — Z8719 Personal history of other diseases of the digestive system: Secondary | ICD-10-CM | POA: Diagnosis not present

## 2020-07-27 DIAGNOSIS — M160 Bilateral primary osteoarthritis of hip: Secondary | ICD-10-CM | POA: Diagnosis not present

## 2020-08-02 DIAGNOSIS — M5416 Radiculopathy, lumbar region: Secondary | ICD-10-CM | POA: Diagnosis not present

## 2020-08-02 DIAGNOSIS — N135 Crossing vessel and stricture of ureter without hydronephrosis: Secondary | ICD-10-CM | POA: Diagnosis not present

## 2020-08-02 DIAGNOSIS — M47816 Spondylosis without myelopathy or radiculopathy, lumbar region: Secondary | ICD-10-CM | POA: Diagnosis not present

## 2020-08-02 DIAGNOSIS — M533 Sacrococcygeal disorders, not elsewhere classified: Secondary | ICD-10-CM | POA: Diagnosis not present

## 2020-08-12 DIAGNOSIS — I499 Cardiac arrhythmia, unspecified: Secondary | ICD-10-CM | POA: Diagnosis not present

## 2020-08-12 DIAGNOSIS — Z0000 Encounter for general adult medical examination without abnormal findings: Secondary | ICD-10-CM | POA: Diagnosis not present

## 2020-08-12 DIAGNOSIS — M810 Age-related osteoporosis without current pathological fracture: Secondary | ICD-10-CM | POA: Diagnosis not present

## 2020-08-12 DIAGNOSIS — E559 Vitamin D deficiency, unspecified: Secondary | ICD-10-CM | POA: Diagnosis not present

## 2020-08-12 DIAGNOSIS — M1990 Unspecified osteoarthritis, unspecified site: Secondary | ICD-10-CM | POA: Diagnosis not present

## 2020-08-12 DIAGNOSIS — E785 Hyperlipidemia, unspecified: Secondary | ICD-10-CM | POA: Diagnosis not present

## 2020-08-12 DIAGNOSIS — K2970 Gastritis, unspecified, without bleeding: Secondary | ICD-10-CM | POA: Diagnosis not present

## 2020-08-12 DIAGNOSIS — Z1389 Encounter for screening for other disorder: Secondary | ICD-10-CM | POA: Diagnosis not present

## 2020-08-12 DIAGNOSIS — E1121 Type 2 diabetes mellitus with diabetic nephropathy: Secondary | ICD-10-CM | POA: Diagnosis not present

## 2020-08-23 DIAGNOSIS — K5904 Chronic idiopathic constipation: Secondary | ICD-10-CM | POA: Diagnosis not present

## 2020-08-25 DIAGNOSIS — M533 Sacrococcygeal disorders, not elsewhere classified: Secondary | ICD-10-CM | POA: Diagnosis not present

## 2020-08-30 DIAGNOSIS — H16211 Exposure keratoconjunctivitis, right eye: Secondary | ICD-10-CM | POA: Diagnosis not present

## 2020-09-09 DIAGNOSIS — H2512 Age-related nuclear cataract, left eye: Secondary | ICD-10-CM | POA: Diagnosis not present

## 2020-09-09 DIAGNOSIS — H401122 Primary open-angle glaucoma, left eye, moderate stage: Secondary | ICD-10-CM | POA: Diagnosis not present

## 2020-09-20 DIAGNOSIS — N3281 Overactive bladder: Secondary | ICD-10-CM | POA: Diagnosis not present

## 2020-09-20 DIAGNOSIS — N130 Hydronephrosis with ureteropelvic junction obstruction: Secondary | ICD-10-CM | POA: Diagnosis not present

## 2020-09-22 DIAGNOSIS — R413 Other amnesia: Secondary | ICD-10-CM | POA: Diagnosis not present

## 2020-09-28 DIAGNOSIS — N2889 Other specified disorders of kidney and ureter: Secondary | ICD-10-CM | POA: Diagnosis not present

## 2020-09-28 DIAGNOSIS — I700 Atherosclerosis of aorta: Secondary | ICD-10-CM | POA: Diagnosis not present

## 2020-09-28 DIAGNOSIS — N1330 Unspecified hydronephrosis: Secondary | ICD-10-CM | POA: Diagnosis not present

## 2020-10-05 ENCOUNTER — Other Ambulatory Visit: Payer: Self-pay | Admitting: Urology

## 2020-10-05 ENCOUNTER — Other Ambulatory Visit (HOSPITAL_COMMUNITY): Payer: Self-pay | Admitting: Urology

## 2020-10-05 DIAGNOSIS — H2511 Age-related nuclear cataract, right eye: Secondary | ICD-10-CM | POA: Diagnosis not present

## 2020-10-05 DIAGNOSIS — N1330 Unspecified hydronephrosis: Secondary | ICD-10-CM

## 2020-10-07 DIAGNOSIS — H401111 Primary open-angle glaucoma, right eye, mild stage: Secondary | ICD-10-CM | POA: Diagnosis not present

## 2020-10-07 DIAGNOSIS — H2511 Age-related nuclear cataract, right eye: Secondary | ICD-10-CM | POA: Diagnosis not present

## 2020-10-13 ENCOUNTER — Ambulatory Visit (HOSPITAL_COMMUNITY)
Admission: RE | Admit: 2020-10-13 | Discharge: 2020-10-13 | Disposition: A | Discharge status: Home / Self Care | Payer: Medicare Other | Source: Ambulatory Visit | Attending: Urology | Admitting: Urology

## 2020-10-13 ENCOUNTER — Other Ambulatory Visit: Payer: Self-pay

## 2020-10-13 DIAGNOSIS — N1330 Unspecified hydronephrosis: Secondary | ICD-10-CM | POA: Diagnosis not present

## 2020-10-13 DIAGNOSIS — M549 Dorsalgia, unspecified: Secondary | ICD-10-CM | POA: Diagnosis not present

## 2020-10-13 MED ORDER — FUROSEMIDE 10 MG/ML IJ SOLN
INTRAMUSCULAR | Status: AC
  Administered 2020-10-13: 36 mg via INTRAVENOUS
  Filled 2020-10-13: qty 4

## 2020-10-13 MED ORDER — FUROSEMIDE 10 MG/ML IJ SOLN: 35.6000 mg | Freq: Once | INTRAMUSCULAR | Status: AC

## 2020-10-13 MED ORDER — TECHNETIUM TC 99M MERTIATIDE
5.2000 | Freq: Once | INTRAVENOUS | Status: AC | PRN
  Administered 2020-10-13: 5.2 via INTRAVENOUS

## 2020-10-20 ENCOUNTER — Ambulatory Visit: Payer: Medicare Other | Admitting: Cardiology

## 2020-10-21 ENCOUNTER — Other Ambulatory Visit: Payer: Self-pay

## 2020-10-21 NOTE — Pre-Procedure Instructions (Signed)
Surgical Instructions    Your procedure is scheduled on Tuesday 10/26/20.   Report to [facility] Main Entrance "A" at 05:30 A.M., then check in with the Admitting office.  Call this number if you have problems the morning of surgery:  [phone]   If you have any questions prior to your surgery date call [phone]: Open Monday-Friday 8am-4pm    Remember:  Do not eat after midnight the night before your surgery  You may drink clear liquids until 04:30 A.M. the morning of your surgery.   Clear liquids allowed are: Water, Non-Citrus Juices (without pulp), Carbonated Beverages, Clear Tea, Black Coffee ONLY (NO MILK, CREAM OR POWDERED CREAMER of any kind), and Gatorade  Patient Instructions  The night before surgery:  No food after midnight. ONLY clear liquids after midnight The day of surgery (if you have diabetes): Drink ONE (1) 12 oz G2 given to you in your pre admission testing appointment by 04:30 A.M. the morning of surgery. Drink in one sitting. Do not sip.  This drink was given to you during your hospital  pre-op appointment visit.  Nothing else to drink after completing the  12 oz bottle of G2.         If you have questions, please contact your surgeon's office.     Take these medicines the morning of surgery with A SIP OF WATER   atorvastatin (LIPITOR) buPROPion (WELLBUTRIN XL)   gemfibrozil (LOPID)  omeprazole (PRILOSEC) pregabalin (LYRICA)   Take these medicines if needed:   fluticasone (FLONASE)  oxybutynin (DITROPAN)  traMADol (ULTRAM)    As of today, STOP taking any Aspirin (unless otherwise instructed by your surgeon) meloxicam ([place]), diclofenac sodium (VOLTAREN), Aleve, Naproxen, Ibuprofen, Motrin, Advil, Goody's, BC's, all herbal medications, fish oil, and all vitamins.     After your COVID test   You are not required to quarantine however you are required to wear a well-fitting mask when you are out and around people not in your household.  If  your mask becomes wet or soiled, replace with a new one.  Wash your hands often with soap and water for 20 seconds or clean your hands with an alcohol-based hand sanitizer that contains at least 60% alcohol.  Do not share personal items.  Notify your provider: if you are in close contact with someone who has COVID  or if you develop a fever of 100.4 or greater, sneezing, cough, sore throat, shortness of breath or body aches.             Do not wear jewelry or makeup Do not wear lotions, powders, perfumes/colognes, or deodorant. Do not shave 48 hours prior to surgery.  Men may shave face and neck. Do not bring valuables to the hospital. DO Not wear nail polish, gel polish, artificial nails, or any other type of covering on natural nails including finger and toenails. If patients have artificial nails, gel coating, etc. that need to be removed by a nail salon, please have this removed prior to surgery or surgery may need to be canceled/delayed if the surgeon/ anesthesia feels like the patient is unable to be adequately monitored.             [facility] is not responsible for any belongings or valuables.  Do NOT Smoke (Tobacco/Vaping)  24 hours prior to your procedure  If you use a CPAP at night, you may bring your mask for your overnight stay.   Contacts, glasses, hearing aids, dentures or partials may  not be worn into surgery, please bring cases for these belongings   For patients admitted to the hospital, discharge time will be determined by your treatment team.   Patients discharged the day of surgery will not be allowed to drive home, and someone needs to stay with them for 24 hours.  NO VISITORS WILL BE ALLOWED IN PRE-OP WHERE PATIENTS ARE PREPPED FOR SURGERY.  ONLY 1 SUPPORT PERSON MAY BE PRESENT IN THE WAITING ROOM WHILE YOU ARE IN SURGERY.  IF YOU ARE TO BE ADMITTED, ONCE YOU ARE IN YOUR ROOM YOU WILL BE ALLOWED TWO (2) VISITORS. 1 (ONE) VISITOR MAY STAY OVERNIGHT BUT MUST  ARRIVE TO THE ROOM BY 8pm.  Minor children may have two parents present. Special consideration for safety and communication needs will be reviewed on a case by case basis.  Special instructions:    Oral Hygiene is also important to reduce your risk of infection.  Remember - BRUSH YOUR TEETH THE MORNING OF SURGERY WITH YOUR REGULAR TOOTHPASTE   [place]- Preparing For Surgery  Before surgery, you can play an important role. Because skin is not sterile, your skin needs to be as free of germs as possible. You can reduce the number of germs on your skin by washing with CHG (chlorahexidine gluconate) Soap before surgery.  CHG is an antiseptic cleaner which kills germs and bonds with the skin to continue killing germs even after washing.     Please do not use if you have an allergy to CHG or antibacterial soaps. If your skin becomes reddened/irritated stop using the CHG.  Do not shave (including legs and underarms) for at least 48 hours prior to first CHG shower. It is OK to shave your face.  Please follow these instructions carefully.     Shower the NIGHT BEFORE SURGERY and the MORNING OF SURGERY with CHG Soap.   If you chose to wash your hair, wash your hair first as usual with your normal shampoo. After you shampoo, rinse your hair and body thoroughly to remove the shampoo.  Then [employer] and genitals (private parts) with your normal soap and rinse thoroughly to remove soap.  After that Use CHG Soap as you would any other liquid soap. You can apply CHG directly to the skin and wash gently with a scrungie or a clean washcloth.   Apply the CHG Soap to your body ONLY FROM THE NECK DOWN.  Do not use on open wounds or open sores. Avoid contact with your eyes, ears, mouth and genitals (private parts). Wash Face and genitals (private parts)  with your normal soap.   Wash thoroughly, paying special attention to the area where your surgery will be performed.  Thoroughly rinse your body with warm  water from the neck down.  DO NOT shower/wash with your normal soap after using and rinsing off the CHG Soap.  Pat yourself dry with a CLEAN TOWEL.  Wear CLEAN PAJAMAS to bed the night before surgery  Place CLEAN SHEETS on your bed the night before your surgery  DO NOT SLEEP WITH PETS.   Day of Surgery:  Take a shower with CHG soap. Wear Clean/Comfortable clothing the morning of surgery Do not apply any deodorants/lotions.   Remember to brush your teeth WITH YOUR REGULAR TOOTHPASTE.   Please read over the following fact sheets that you were given.        Oral Hygiene is also important to reduce your risk of infection.  Remember - BRUSH YOUR  TEETH THE MORNING OF SURGERY WITH YOUR REGULAR TOOTHPASTE  [facility]- Preparing for Total Shoulder Arthroplasty  Before surgery, you can play an important role. Because skin is not sterile, your skin needs to be as free of germs as possible. You can reduce the number of germs on your skin by using the following products.   Benzoyl Peroxide Gel  o Reduces the number of germs present on the skin  o Applied twice a day to shoulder area starting two days before surgery   Chlorhexidine Gluconate (CHG) Soap (instructions listed above on how to wash with CHG Soap)  o An antiseptic cleaner that kills germs and bonds with the skin to continue killing germs even after washing  o Used for showering the night before surgery and morning of surgery   ==================================================================  Please follow these instructions carefully:  BENZOYL PEROXIDE 5% GEL  Please do not use if you have an allergy to benzoyl peroxide. If your skin becomes reddened/irritated stop using the benzoyl peroxide.  Starting two days before surgery, apply as follows:  1. Apply benzoyl peroxide in the morning and at night. Apply after taking a shower. If you are not taking a shower clean entire shoulder front, back, and side along  with the armpit with a clean wet washcloth.  2. Place a quarter-sized dollop on your SHOULDER and rub in thoroughly, making sure to cover the front, back, and side of your shoulder, along with the armpit.   2 Days prior to Surgery First Dose on _____________ Morning Second Dose on ______________ Night  Day Before Surgery First Dose on ______________ Morning Night before surgery wash (entire body except face and private areas) with CHG Soap THEN Second Dose on ____________ Night   Morning of Surgery  wash BODY AGAIN with CHG Soap   4. Do NOT apply benzoyl peroxide gel on the day of surgery

## 2020-10-22 ENCOUNTER — Inpatient Hospital Stay (HOSPITAL_COMMUNITY)
Admission: RE | Admit: 2020-10-22 | Discharge: 2020-10-22 | Disposition: A | Discharge status: Home / Self Care | Payer: Medicare Other | Source: Ambulatory Visit

## 2020-10-26 DIAGNOSIS — Z01818 Encounter for other preprocedural examination: Secondary | ICD-10-CM

## 2020-10-29 DIAGNOSIS — Z23 Encounter for immunization: Secondary | ICD-10-CM | POA: Diagnosis not present

## 2020-11-10 ENCOUNTER — Encounter: Payer: Medicare Other | Admitting: Orthopedic Surgery

## 2020-11-15 DIAGNOSIS — H16223 Keratoconjunctivitis sicca, not specified as Sjogren's, bilateral: Secondary | ICD-10-CM | POA: Diagnosis not present

## 2020-11-15 NOTE — Progress Notes (Addendum)
Surgical Instructions    Your procedure is scheduled on Thursday, November 17th, 2022.   Report to [facility] Main Entrance "A" at 09:50 A.M., then check in with the Admitting office.  Call this number if you have problems the morning of surgery:  [phone]   If you have any questions prior to your surgery date call [phone]: Open Monday-Friday 8am-4pm    Remember:  Do not eat after midnight the night before your surgery  You may drink clear liquids until 08:50 the morning of your surgery.   Clear liquids allowed are: Water, Non-Citrus Juices (without pulp), Carbonated Beverages, Clear Tea, Black Coffee ONLY (NO MILK, CREAM OR POWDERED CREAMER of any kind), and Gatorade  Patient Instructions   The day of surgery (if you have diabetes): Drink ONE (1) 12 oz G2 given to you in your pre admission testing appointment by 08:50 the morning of surgery. Drink in one sitting. Do not sip.  This drink was given to you during your hospital  pre-op appointment visit.  Nothing else to drink after completing the  12 oz bottle of G2.         If you have questions, please contact your surgeon's office.     Take these medicines the morning of surgery with A SIP OF WATER:  atorvastatin (LIPITOR)  buPROPion (WELLBUTRIN XL)   gemfibrozil (LOPID) omeprazole (PRILOSEC)  pregabalin (LYRICA)  mirabegron ER (MYRBETRIQ)  If needed:  acetaminophen (TYLENOL) oxybutynin (DITROPAN)  traMADol (ULTRAM)  fluticasone (FLONASE)   WHAT DO I DO ABOUT MY DIABETES MEDICATION?  Do not take Dulaglutide (TRULICITY) the day of surgery   HOW TO [place]  Why is it important to control my blood sugar before and after surgery? Improving blood sugar levels before and after surgery helps healing and can limit problems. A way of improving blood sugar control is eating a healthy diet by:  Eating less sugar and carbohydrates  Increasing activity/exercise  Talking  with your doctor about reaching your blood sugar goals High blood sugars (greater than 180 mg/dL) can raise your risk of infections and slow your recovery, so you will need to focus on controlling your diabetes during the weeks before surgery. Make sure that the doctor who takes care of your diabetes knows about your planned surgery including the date and location.  How do I manage my blood sugar before surgery? Check your blood sugar at least 4 times a day, starting 2 days before surgery, to make sure that the level is not too high or low.  Check your blood sugar the morning of your surgery when you wake up and every 2 hours until you get to the Short Stay unit.  If your blood sugar is less than 70 mg/dL, you will need to treat for low blood sugar: Do not take insulin. Treat a low blood sugar (less than 70 mg/dL) with  cup of clear juice (cranberry or apple), 4 glucose tablets, OR glucose gel. Recheck blood sugar in 15 minutes after treatment (to make sure it is greater than 70 mg/dL). If your blood sugar is not greater than 70 mg/dL on recheck, call [phone] for further instructions. Report your blood sugar to the short stay nurse when you get to Short Stay.  If you are admitted to the hospital after surgery: Your blood sugar will be checked by the staff and you will probably be given insulin after surgery (instead of oral diabetes medicines) to make sure you have  good blood sugar levels. The goal for blood sugar control after surgery is 80-180 mg/dL.     As of today, STOP taking any Aspirin (unless otherwise instructed by your surgeon) Aleve, Naproxen, Ibuprofen, Motrin, Advil, Goody's, BC's, all herbal medications, fish oil, and all vitamins.   After your COVID test   You are not required to quarantine however you are required to wear a well-fitting mask when you are out and around people not in your household.  If your mask becomes wet or soiled, replace with a new one.  Wash  your hands often with soap and water for 20 seconds or clean your hands with an alcohol-based hand sanitizer that contains at least 60% alcohol.  Do not share personal items.  Notify your provider: if you are in close contact with someone who has COVID  or if you develop a fever of 100.4 or greater, sneezing, cough, sore throat, shortness of breath or body aches.    The day of surgery:          Do not wear jewelry or makeup Do not wear lotions, powders, perfumes, or deodorant. Do not shave 48 hours prior to surgery.   Do not bring valuables to the hospital. DO Not wear nail polish, gel polish, artificial nails, or any other type of covering on natural nails including finger and toenails. If patients have artificial nails, gel coating, etc. that need to be removed by a nail salon, please have this removed prior to surgery or surgery may need to be canceled/delayed if the surgeon/ anesthesia feels like the patient is unable to be adequately monitored.              [facility] is not responsible for any belongings or valuables.  Do NOT Smoke (Tobacco/Vaping)  24 hours prior to your procedure  If you use a CPAP at night, you may bring your mask for your overnight stay.   Contacts, glasses, hearing aids, dentures or partials may not be worn into surgery, please bring cases for these belongings   For patients admitted to the hospital, discharge time will be determined by your treatment team.   Patients discharged the day of surgery will not be allowed to drive home, and someone needs to stay with them for 24 hours.  NO VISITORS WILL BE ALLOWED IN PRE-OP WHERE PATIENTS ARE PREPPED FOR SURGERY.  ONLY 1 SUPPORT PERSON MAY BE PRESENT IN THE WAITING ROOM WHILE YOU ARE IN SURGERY.  IF YOU ARE TO BE ADMITTED, ONCE YOU ARE IN YOUR ROOM YOU WILL BE ALLOWED TWO (2) VISITORS. 1 (ONE) VISITOR MAY STAY OVERNIGHT BUT MUST ARRIVE TO THE ROOM BY 8pm.  Minor children may have two parents present. Special  consideration for safety and communication needs will be reviewed on a case by case basis.  Special instructions:    Oral Hygiene is also important to reduce your risk of infection.  Remember - BRUSH YOUR TEETH THE MORNING OF SURGERY WITH YOUR REGULAR TOOTHPASTE   [facility]- Preparing for Shoulder Surgery  ?  Before surgery, you can play an important role. Because skin is not sterile, your skin needs to be as free of germs as possible. You can reduce the number of germs on your skin by using the following products.   1). Benzoyl Peroxide Gel: reduces the number of germs present on the skin   *Applied twice a day to shoulder area starting two days before surgery     2). Chlorhexidine Gluconate (  CHG) Soap: An antiseptic cleaner that kills germs and bonds with the skin to continue killing germs even after washing   *Used for showering the night before surgery and morning of surgery      Please follow these instructions carefully:     1). BENZOYL PEROXIDE 5% GEL (Please do not use if you have an allergy to benzoyl peroxide.   If your skin becomes reddened/irritated stop using the benzoyl peroxide)     Starting TWO DAYS BEFORE surgery:    Apply benzoyl peroxide in the morning and at night. Apply after taking a shower. If you are not taking a shower clean entire shoulder front, back, and side along with the armpit with a clean wet washcloth.     Place a quarter-sized amount of gel on your shoulder and rub in thoroughly, making sure to cover the front, back, and side of your shoulder, along with the armpit.                           Do this twice a day for two days.  (Last application is the night before surgery, AFTER using the CHG soap as described below).   Do NOT apply benzoyl peroxide gel on the day of surgery.   2 days before 11/15 AM   11/15 PM              1 day before _11/16___ AM   __11/16__ PM    2) CHG Soap: Please do not use if you have an allergy to CHG or  antibacterial soaps. If your skin becomes reddened/irritated stop using the CHG.  Do not shave (including legs and underarms) for at least 48 hours prior to first CHG shower. It is OK to shave your face.  Please follow these instructions carefully.   Shower the NIGHT BEFORE SURGERY (before applying benzoyl peroxide gel) and the MORNING OF SURGERY with CHG Soap.   If you chose to wash your hair, wash your hair first as usual with your normal shampoo.  After you shampoo, rinse your hair and body thoroughly to remove the shampoo.  Use CHG as you would any other liquid soap. You can apply CHG directly to the skin and wash gently with a scrungie or a clean washcloth.   Apply the CHG Soap to your body ONLY FROM THE NECK DOWN.  Do not use on open wounds or open sores. Avoid contact with your eyes, ears, mouth and genitals (private parts). Wash Face and genitals (private parts) with your normal soap.   Wash thoroughly, paying special attention to the area where your surgery will be performed.  Thoroughly rinse your body with warm water from the neck down.  DO NOT shower/wash with your normal soap after using and rinsing off the CHG Soap.  Pat yourself dry with a CLEAN TOWEL.  Wear CLEAN PAJAMAS to bed the night before surgery  Place CLEAN SHEETS on your bed the night of your first shower and DO NOT SLEEP WITH PETS.  Oral Hygiene is also important to reduce your risk of infection.  Remember - BRUSH YOUR TEETH THE MORNING OF SURGERY WITH YOUR REGULAR TOOTHPASTE   Day of Surgery: Wear Clean/Comfortable clothing the morning of surgery Do not apply any deodorants/lotions.   Remember to brush your teeth WITH YOUR REGULAR TOOTHPASTE.     Please read over the following fact sheets that you were given.

## 2020-11-16 ENCOUNTER — Encounter (HOSPITAL_COMMUNITY)
Admission: RE | Admit: 2020-11-16 | Discharge: 2020-11-16 | Disposition: A | Discharge status: Home / Self Care | Payer: Medicare Other | Source: Ambulatory Visit | Attending: Orthopedic Surgery | Admitting: Orthopedic Surgery

## 2020-11-16 ENCOUNTER — Other Ambulatory Visit: Payer: Self-pay

## 2020-11-16 ENCOUNTER — Encounter (HOSPITAL_COMMUNITY): Payer: Self-pay

## 2020-11-16 VITALS — BP 121/65 | HR 73 | Temp 97.8°F | Resp 17 | Ht 66.0 in | Wt 162.2 lb

## 2020-11-16 DIAGNOSIS — Z01818 Encounter for other preprocedural examination: Secondary | ICD-10-CM | POA: Diagnosis not present

## 2020-11-16 DIAGNOSIS — Z20822 Contact with and (suspected) exposure to covid-19: Secondary | ICD-10-CM | POA: Insufficient documentation

## 2020-11-16 DIAGNOSIS — E118 Type 2 diabetes mellitus with unspecified complications: Secondary | ICD-10-CM | POA: Insufficient documentation

## 2020-11-16 HISTORY — DX: Anemia, unspecified: D64.9

## 2020-11-16 HISTORY — DX: Anxiety disorder, unspecified: F41.9

## 2020-11-16 HISTORY — DX: Gastro-esophageal reflux disease without esophagitis: K21.9

## 2020-11-16 HISTORY — DX: Pneumonia, unspecified organism: J18.9

## 2020-11-16 HISTORY — DX: Nonrheumatic mitral (valve) prolapse: I34.1

## 2020-11-16 LAB — URINALYSIS, ROUTINE W REFLEX MICROSCOPIC
Bilirubin Urine: NEGATIVE
Glucose, UA: NEGATIVE mg/dL
Hgb urine dipstick: NEGATIVE
Ketones, ur: NEGATIVE mg/dL
Leukocytes,Ua: NEGATIVE
Nitrite: NEGATIVE
Protein, ur: NEGATIVE mg/dL
Specific Gravity, Urine: 1.012 (ref 1.005–1.030)
pH: 5 (ref 5.0–8.0)

## 2020-11-16 LAB — BASIC METABOLIC PANEL
Anion gap: 8 (ref 5–15)
BUN: 17 mg/dL (ref 8–23)
CO2: 25 mmol/L (ref 22–32)
Calcium: 9 mg/dL (ref 8.9–10.3)
Chloride: 105 mmol/L (ref 98–111)
Creatinine, Ser: 0.95 mg/dL (ref 0.44–1.00)
GFR, Estimated: 60 mL/min — ABNORMAL LOW (ref >60)
Glucose, Bld: 92 mg/dL (ref 70–99)
Potassium: 3.8 mmol/L (ref 3.5–5.1)
Sodium: 138 mmol/L (ref 135–145)

## 2020-11-16 LAB — SARS CORONAVIRUS 2 (TAT 6-24 HRS): SARS Coronavirus 2: NEGATIVE

## 2020-11-16 LAB — CBC
HCT: 41.5 % (ref 36.0–46.0)
Hemoglobin: 13.1 g/dL (ref 12.0–15.0)
MCH: 28.7 pg (ref 26.0–34.0)
MCHC: 31.6 g/dL (ref 30.0–36.0)
MCV: 91 fL (ref 80.0–100.0)
Platelets: 384 10*3/uL (ref 150–400)
RBC: 4.56 MIL/uL (ref 3.87–5.11)
RDW: 14.6 % (ref 11.5–15.5)
WBC: 7.8 10*3/uL (ref 4.0–10.5)
nRBC: 0 % (ref 0.0–0.2)

## 2020-11-16 LAB — HEMOGLOBIN A1C
Hgb A1c MFr Bld: 5.7 % — ABNORMAL HIGH (ref 4.8–5.6)
Mean Plasma Glucose: 116.89 mg/dL

## 2020-11-16 LAB — SURGICAL PCR SCREEN
MRSA, PCR: NEGATIVE
Staphylococcus aureus: NEGATIVE

## 2020-11-16 LAB — GLUCOSE, CAPILLARY: Glucose-Capillary: 117 mg/dL — ABNORMAL HIGH (ref 70–99)

## 2020-11-16 NOTE — Progress Notes (Addendum)
PCP - Kelton Pillar, MD Cardiologist - denies  PPM/ICD - denies Device Orders - n/a Rep Notified - n/a  Chest x-ray - n/a EKG - 11/16/2020 Stress Test - denies ECHO - denies Cardiac Cath - denies  Sleep Study - denies CPAP - n/a  Fasting Blood Sugar - 110 - 120 Checks Blood Sugar once a day CBG today - 117 A1C - done in PAT on 11/16/2020  Blood Thinner Instructions: n/a  Aspirin Instructions: Patient was instructed: As of today, STOP taking any Aspirin (unless otherwise instructed by your surgeon) Aleve, Naproxen, Ibuprofen, Motrin, Advil, Goody's, BC's, all herbal medications, fish oil, and all vitamins.  ERAS Protcol - yes PRE-SURGERY G2- yes  COVID TEST- done in PAT on 11/16/2020   Anesthesia review: yes - abnormal EKG in PAT.  Patient denies shortness of breath, fever, cough and chest pain at PAT appointment   All instructions explained to the patient, with a verbal understanding of the material. Patient agrees to go over the instructions while at home for a better understanding. Patient also instructed to self quarantine after being tested for COVID-19. The opportunity to ask questions was provided.

## 2020-11-17 ENCOUNTER — Encounter (HOSPITAL_COMMUNITY): Payer: Self-pay

## 2020-11-17 LAB — URINE CULTURE: Culture: NO GROWTH

## 2020-11-17 NOTE — Anesthesia Preprocedure Evaluation (Addendum)
Anesthesia Evaluation  Patient identified by MRN, date of birth, ID band Patient awake    Reviewed: Allergy & Precautions, NPO status , Patient's Chart, lab work & pertinent test results  Airway Mallampati: II  TM Distance: >3 FB Neck ROM: Full    Dental no notable dental hx. (+) Teeth Intact, Dental Advisory Given   Pulmonary former smoker,    Pulmonary exam normal breath sounds clear to auscultation       Cardiovascular Exercise Tolerance: Good Normal cardiovascular exam Rhythm:Regular Rate:Normal     Neuro/Psych    GI/Hepatic Neg liver ROS, GERD  ,  Endo/Other  diabetes  Renal/GU Lab Results      Component                Value               Date                      CREATININE               0.95                11/16/2020                BUN                      17                  11/16/2020                NA                       138                 11/16/2020                K                        3.8                 11/16/2020                CL                       105                 11/16/2020                CO2                      25                  11/16/2020                Musculoskeletal  (+) Arthritis ,   Abdominal   Peds  Hematology Lab Results      Component                Value               Date                      WBC                      7.8  11/16/2020                HGB                      13.1                11/16/2020                HCT                      41.5                11/16/2020                MCV                      91.0                11/16/2020                PLT                      384                 11/16/2020              Anesthesia Other Findings   Reproductive/Obstetrics                           Anesthesia Physical Anesthesia Plan  ASA: 2  Anesthesia Plan: General   Post-op Pain Management:  Regional for Post-op pain    Induction: Intravenous  PONV Risk Score and Plan: 4 or greater and Ondansetron, Treatment may vary due to age or medical condition and Dexamethasone  Airway Management Planned: Oral ETT  Additional Equipment: None  Intra-op Plan:   Post-operative Plan: Extubation in OR  Informed Consent: I have reviewed the patients History and Physical, chart, labs and discussed the procedure including the risks, benefits and alternatives for the proposed anesthesia with the patient or authorized representative who has indicated his/her understanding and acceptance.     Dental advisory given  Plan Discussed with: CRNA and Anesthesiologist  Anesthesia Plan Comments: (Preop EKG notable for frequent PVCs.  Comparison tracing requested from PCP Dr. Delene Ruffini office.  Tracing dated 08/12/2020 appears similar, sinus rhythm with frequent PVCs.  R ISB w Exparel plus GA)     Anesthesia Quick Evaluation

## 2020-11-18 ENCOUNTER — Encounter (HOSPITAL_COMMUNITY): Payer: Self-pay | Admitting: Orthopedic Surgery

## 2020-11-18 ENCOUNTER — Ambulatory Visit (HOSPITAL_COMMUNITY): Payer: Medicare Other | Admitting: Physician Assistant

## 2020-11-18 ENCOUNTER — Other Ambulatory Visit: Payer: Self-pay

## 2020-11-18 ENCOUNTER — Ambulatory Visit (HOSPITAL_COMMUNITY): Payer: Medicare Other | Admitting: Anesthesiology

## 2020-11-18 ENCOUNTER — Observation Stay (HOSPITAL_COMMUNITY)
Admission: RE | Admit: 2020-11-18 | Discharge: 2020-11-20 | Disposition: A | Discharge status: Home / Self Care | Payer: Medicare Other | Attending: Orthopedic Surgery | Admitting: Orthopedic Surgery

## 2020-11-18 ENCOUNTER — Observation Stay (HOSPITAL_COMMUNITY): Payer: Medicare Other

## 2020-11-18 ENCOUNTER — Encounter (HOSPITAL_COMMUNITY)
Admission: RE | Disposition: A | Discharge status: Home / Self Care | Payer: Self-pay | Source: Home / Self Care | Attending: Orthopedic Surgery

## 2020-11-18 DIAGNOSIS — Z96611 Presence of right artificial shoulder joint: Secondary | ICD-10-CM | POA: Diagnosis not present

## 2020-11-18 DIAGNOSIS — M19011 Primary osteoarthritis, right shoulder: Secondary | ICD-10-CM

## 2020-11-18 DIAGNOSIS — J9811 Atelectasis: Secondary | ICD-10-CM | POA: Diagnosis not present

## 2020-11-18 DIAGNOSIS — Z79899 Other long term (current) drug therapy: Secondary | ICD-10-CM | POA: Insufficient documentation

## 2020-11-18 DIAGNOSIS — G8918 Other acute postprocedural pain: Secondary | ICD-10-CM | POA: Diagnosis not present

## 2020-11-18 DIAGNOSIS — M12811 Other specific arthropathies, not elsewhere classified, right shoulder: Secondary | ICD-10-CM | POA: Diagnosis not present

## 2020-11-18 DIAGNOSIS — Z9889 Other specified postprocedural states: Secondary | ICD-10-CM

## 2020-11-18 DIAGNOSIS — Z7985 Long-term (current) use of injectable non-insulin antidiabetic drugs: Secondary | ICD-10-CM | POA: Insufficient documentation

## 2020-11-18 DIAGNOSIS — Z01818 Encounter for other preprocedural examination: Secondary | ICD-10-CM

## 2020-11-18 DIAGNOSIS — Z87891 Personal history of nicotine dependence: Secondary | ICD-10-CM | POA: Insufficient documentation

## 2020-11-18 DIAGNOSIS — E119 Type 2 diabetes mellitus without complications: Secondary | ICD-10-CM | POA: Diagnosis not present

## 2020-11-18 DIAGNOSIS — M7989 Other specified soft tissue disorders: Secondary | ICD-10-CM | POA: Diagnosis not present

## 2020-11-18 DIAGNOSIS — K219 Gastro-esophageal reflux disease without esophagitis: Secondary | ICD-10-CM | POA: Diagnosis not present

## 2020-11-18 DIAGNOSIS — Z471 Aftercare following joint replacement surgery: Secondary | ICD-10-CM | POA: Diagnosis not present

## 2020-11-18 HISTORY — PX: REVERSE SHOULDER ARTHROPLASTY: SHX5054

## 2020-11-18 LAB — GLUCOSE, CAPILLARY
Glucose-Capillary: 79 mg/dL (ref 70–99)
Glucose-Capillary: 92 mg/dL (ref 70–99)

## 2020-11-18 SURGERY — ARTHROPLASTY, SHOULDER, TOTAL, REVERSE
Anesthesia: General | Site: Shoulder | Laterality: Right

## 2020-11-18 MED ORDER — PREGABALIN 75 MG PO CAPS
75.0000 mg | ORAL_CAPSULE | Freq: Two times a day (BID) | ORAL | Status: DC
  Administered 2020-11-18 – 2020-11-20 (×4): 75 mg via ORAL
  Filled 2020-11-18 (×3): qty 1

## 2020-11-18 MED ORDER — SUGAMMADEX SODIUM 200 MG/2ML IV SOLN
INTRAVENOUS | Status: DC | PRN
  Administered 2020-11-18: 300 mg via INTRAVENOUS

## 2020-11-18 MED ORDER — ONDANSETRON HCL 4 MG PO TABS: 4.0000 mg | ORAL_TABLET | Freq: Four times a day (QID) | ORAL | Status: DC | PRN

## 2020-11-18 MED ORDER — METOCLOPRAMIDE HCL 5 MG/ML IJ SOLN: 5.0000 mg | Freq: Three times a day (TID) | INTRAMUSCULAR | Status: DC | PRN

## 2020-11-18 MED ORDER — MELATONIN 5 MG PO TABS
5.0000 mg | ORAL_TABLET | Freq: Every day | ORAL | Status: DC
  Administered 2020-11-18 – 2020-11-19 (×2): 5 mg via ORAL
  Filled 2020-11-18 (×2): qty 1

## 2020-11-18 MED ORDER — DOCUSATE SODIUM 100 MG PO CAPS
100.0000 mg | ORAL_CAPSULE | Freq: Two times a day (BID) | ORAL | Status: DC
  Administered 2020-11-19 – 2020-11-20 (×3): 100 mg via ORAL
  Filled 2020-11-18 (×5): qty 1

## 2020-11-18 MED ORDER — GLYCOPYRROLATE 0.2 MG/ML IJ SOLN
INTRAMUSCULAR | Status: DC | PRN
  Administered 2020-11-18: .1 mg via INTRAVENOUS

## 2020-11-18 MED ORDER — ACETAMINOPHEN 325 MG PO TABS
325.0000 mg | ORAL_TABLET | Freq: Four times a day (QID) | ORAL | Status: DC | PRN
  Administered 2020-11-19: 650 mg via ORAL
  Administered 2020-11-19: 325 mg via ORAL
  Filled 2020-11-18 (×2): qty 2

## 2020-11-18 MED ORDER — MIRABEGRON ER 25 MG PO TB24
25.0000 mg | ORAL_TABLET | Freq: Every day | ORAL | Status: DC
  Administered 2020-11-19 – 2020-11-20 (×2): 25 mg via ORAL
  Filled 2020-11-18 (×2): qty 1

## 2020-11-18 MED ORDER — HYDROCODONE-ACETAMINOPHEN 5-325 MG PO TABS
1.0000 | ORAL_TABLET | ORAL | Status: DC | PRN
  Administered 2020-11-18: 17:00:00 2 via ORAL
  Administered 2020-11-19 (×2): 1 via ORAL
  Administered 2020-11-19 – 2020-11-20 (×3): 2 via ORAL
  Filled 2020-11-18 (×4): qty 2
  Filled 2020-11-18 (×2): qty 1

## 2020-11-18 MED ORDER — PANTOPRAZOLE SODIUM 40 MG PO TBEC
80.0000 mg | DELAYED_RELEASE_TABLET | Freq: Every day | ORAL | Status: DC
  Administered 2020-11-19 – 2020-11-20 (×2): 80 mg via ORAL
  Filled 2020-11-18 (×2): qty 2

## 2020-11-18 MED ORDER — MORPHINE SULFATE (PF) 2 MG/ML IV SOLN: 0.5000 mg | INTRAVENOUS | Status: DC | PRN

## 2020-11-18 MED ORDER — POVIDONE-IODINE 10 % EX SWAB: 2.0000 "application " | Freq: Once | CUTANEOUS | Status: DC

## 2020-11-18 MED ORDER — 0.9 % SODIUM CHLORIDE (POUR BTL) OPTIME
TOPICAL | Status: DC | PRN
  Administered 2020-11-18: 13:00:00 3000 mL

## 2020-11-18 MED ORDER — PHENYLEPHRINE HCL-NACL 20-0.9 MG/250ML-% IV SOLN
INTRAVENOUS | Status: DC | PRN
  Administered 2020-11-18: 25 ug/min via INTRAVENOUS

## 2020-11-18 MED ORDER — BUPROPION HCL ER (XL) 300 MG PO TB24
300.0000 mg | ORAL_TABLET | Freq: Every morning | ORAL | Status: DC
  Administered 2020-11-19 – 2020-11-20 (×2): 300 mg via ORAL
  Filled 2020-11-18 (×2): qty 1

## 2020-11-18 MED ORDER — FENTANYL CITRATE (PF) 100 MCG/2ML IJ SOLN: 50.0000 ug | Freq: Once | INTRAMUSCULAR | Status: AC

## 2020-11-18 MED ORDER — CEFAZOLIN SODIUM-DEXTROSE 2-3 GM-%(50ML) IV SOLR
INTRAVENOUS | Status: DC | PRN
  Administered 2020-11-18: 2 g via INTRAVENOUS

## 2020-11-18 MED ORDER — TRANEXAMIC ACID-NACL 1000-0.7 MG/100ML-% IV SOLN
1000.0000 mg | INTRAVENOUS | Status: AC
  Administered 2020-11-18: 12:00:00 1000 mg via INTRAVENOUS
  Filled 2020-11-18: qty 100

## 2020-11-18 MED ORDER — METOCLOPRAMIDE HCL 5 MG PO TABS: 5.0000 mg | ORAL_TABLET | Freq: Three times a day (TID) | ORAL | Status: DC | PRN

## 2020-11-18 MED ORDER — BUPIVACAINE LIPOSOME 1.3 % IJ SUSP
INTRAMUSCULAR | Status: DC | PRN
  Administered 2020-11-18: 10 mL via PERINEURAL

## 2020-11-18 MED ORDER — METHOCARBAMOL 500 MG PO TABS
500.0000 mg | ORAL_TABLET | Freq: Four times a day (QID) | ORAL | Status: DC | PRN
  Administered 2020-11-19: 500 mg via ORAL
  Filled 2020-11-18: qty 1

## 2020-11-18 MED ORDER — ASPIRIN EC 81 MG PO TBEC
81.0000 mg | DELAYED_RELEASE_TABLET | Freq: Every day | ORAL | Status: DC
  Administered 2020-11-19 – 2020-11-20 (×2): 81 mg via ORAL
  Filled 2020-11-18 (×2): qty 1

## 2020-11-18 MED ORDER — BUPIVACAINE HCL (PF) 0.5 % IJ SOLN
INTRAMUSCULAR | Status: DC | PRN
  Administered 2020-11-18: 15 mL via PERINEURAL

## 2020-11-18 MED ORDER — PROPOFOL 10 MG/ML IV BOLUS
INTRAVENOUS | Status: DC | PRN
  Administered 2020-11-18: 160 mg via INTRAVENOUS

## 2020-11-18 MED ORDER — VANCOMYCIN HCL 1000 MG IV SOLR
INTRAVENOUS | Status: AC
  Filled 2020-11-18: qty 20

## 2020-11-18 MED ORDER — ORAL CARE MOUTH RINSE: 15.0000 mL | Freq: Once | OROMUCOSAL | Status: AC

## 2020-11-18 MED ORDER — DEXAMETHASONE SODIUM PHOSPHATE 10 MG/ML IJ SOLN
INTRAMUSCULAR | Status: DC | PRN
  Administered 2020-11-18: 10 mg via INTRAVENOUS

## 2020-11-18 MED ORDER — VANCOMYCIN HCL 1000 MG IV SOLR
INTRAVENOUS | Status: DC | PRN
  Administered 2020-11-18: 1 g

## 2020-11-18 MED ORDER — MENTHOL 3 MG MT LOZG
1.0000 | LOZENGE | OROMUCOSAL | Status: DC | PRN
  Filled 2020-11-18: qty 9

## 2020-11-18 MED ORDER — ONDANSETRON HCL 4 MG/2ML IJ SOLN: 4.0000 mg | Freq: Once | INTRAMUSCULAR | Status: DC | PRN

## 2020-11-18 MED ORDER — LACTATED RINGERS IV SOLN: INTRAVENOUS | Status: DC

## 2020-11-18 MED ORDER — FENTANYL CITRATE (PF) 100 MCG/2ML IJ SOLN: 25.0000 ug | INTRAMUSCULAR | Status: DC | PRN

## 2020-11-18 MED ORDER — ONDANSETRON HCL 4 MG/2ML IJ SOLN
INTRAMUSCULAR | Status: DC | PRN
  Administered 2020-11-18 (×2): 4 mg via INTRAVENOUS

## 2020-11-18 MED ORDER — CHLORHEXIDINE GLUCONATE 0.12 % MT SOLN
15.0000 mL | Freq: Once | OROMUCOSAL | Status: AC
  Administered 2020-11-18: 11:00:00 15 mL via OROMUCOSAL
  Filled 2020-11-18: qty 15

## 2020-11-18 MED ORDER — IRRISEPT - 450ML BOTTLE WITH 0.05% CHG IN STERILE WATER, USP 99.95% OPTIME
TOPICAL | Status: DC | PRN
  Administered 2020-11-18: 13:00:00 450 mL

## 2020-11-18 MED ORDER — FENTANYL CITRATE (PF) 100 MCG/2ML IJ SOLN
INTRAMUSCULAR | Status: AC
  Administered 2020-11-18: 11:00:00 50 ug via INTRAVENOUS
  Filled 2020-11-18: qty 2

## 2020-11-18 MED ORDER — PHENOL 1.4 % MT LIQD: 1.0000 | OROMUCOSAL | Status: DC | PRN

## 2020-11-18 MED ORDER — ACETAMINOPHEN 10 MG/ML IV SOLN: 1000.0000 mg | Freq: Once | INTRAVENOUS | Status: DC | PRN

## 2020-11-18 MED ORDER — LIDOCAINE HCL (CARDIAC) PF 100 MG/5ML IV SOSY
PREFILLED_SYRINGE | INTRAVENOUS | Status: DC | PRN
  Administered 2020-11-18: 80 mg via INTRATRACHEAL

## 2020-11-18 MED ORDER — METHOCARBAMOL 1000 MG/10ML IJ SOLN
500.0000 mg | Freq: Four times a day (QID) | INTRAVENOUS | Status: DC | PRN
  Filled 2020-11-18: qty 5

## 2020-11-18 MED ORDER — ACETAMINOPHEN 500 MG PO TABS
500.0000 mg | ORAL_TABLET | Freq: Four times a day (QID) | ORAL | Status: AC
  Administered 2020-11-19 (×3): 500 mg via ORAL
  Filled 2020-11-18 (×3): qty 1

## 2020-11-18 MED ORDER — VANCOMYCIN HCL IN DEXTROSE 1-5 GM/200ML-% IV SOLN
1000.0000 mg | INTRAVENOUS | Status: AC
  Administered 2020-11-18: 11:00:00 1000 mg via INTRAVENOUS
  Filled 2020-11-18: qty 200

## 2020-11-18 MED ORDER — POVIDONE-IODINE 7.5 % EX SOLN
Freq: Once | CUTANEOUS | Status: DC
  Filled 2020-11-18: qty 118

## 2020-11-18 MED ORDER — ROCURONIUM BROMIDE 100 MG/10ML IV SOLN
INTRAVENOUS | Status: DC | PRN
  Administered 2020-11-18: 70 mg via INTRAVENOUS

## 2020-11-18 MED ORDER — CEFAZOLIN SODIUM-DEXTROSE 1-4 GM/50ML-% IV SOLN
1.0000 g | Freq: Three times a day (TID) | INTRAVENOUS | Status: AC
  Administered 2020-11-18 – 2020-11-19 (×3): 1 g via INTRAVENOUS
  Filled 2020-11-18 (×3): qty 50

## 2020-11-18 MED ORDER — ONDANSETRON HCL 4 MG/2ML IJ SOLN: 4.0000 mg | Freq: Four times a day (QID) | INTRAMUSCULAR | Status: DC | PRN

## 2020-11-18 MED ORDER — EPHEDRINE SULFATE 50 MG/ML IJ SOLN
INTRAMUSCULAR | Status: DC | PRN
  Administered 2020-11-18: 10 mg via INTRAVENOUS

## 2020-11-18 MED ORDER — MIDAZOLAM HCL 2 MG/2ML IJ SOLN
INTRAMUSCULAR | Status: AC
  Filled 2020-11-18: qty 2

## 2020-11-18 MED ORDER — FENTANYL CITRATE (PF) 250 MCG/5ML IJ SOLN
INTRAMUSCULAR | Status: AC
  Filled 2020-11-18: qty 5

## 2020-11-18 MED ORDER — PREGABALIN 75 MG PO CAPS
75.0000 mg | ORAL_CAPSULE | Freq: Two times a day (BID) | ORAL | Status: DC
  Filled 2020-11-18: qty 1

## 2020-11-18 MED ORDER — GEMFIBROZIL 600 MG PO TABS
600.0000 mg | ORAL_TABLET | Freq: Two times a day (BID) | ORAL | Status: DC
  Administered 2020-11-18 – 2020-11-20 (×4): 600 mg via ORAL
  Filled 2020-11-18 (×4): qty 1

## 2020-11-18 MED ORDER — FENTANYL CITRATE (PF) 250 MCG/5ML IJ SOLN
INTRAMUSCULAR | Status: DC | PRN
  Administered 2020-11-18: 100 ug via INTRAVENOUS
  Administered 2020-11-18: 50 ug via INTRAVENOUS

## 2020-11-18 SURGICAL SUPPLY — 77 items
AID PSTN UNV HD RSTRNT DISP (MISCELLANEOUS) ×1
ALCOHOL 70% 16 OZ (MISCELLANEOUS) ×2 IMPLANT
APL PRP STRL LF DISP 70% ISPRP (MISCELLANEOUS) ×1
AUG COMP REV MI TAPER ADAPTER (Joint) ×2 IMPLANT
AUGMENT COMP REV MI TAPR ADPTR (Joint) ×1 IMPLANT
BAG COUNTER SPONGE SURGICOUNT (BAG) ×2 IMPLANT
BAG SPNG CNTER NS LX DISP (BAG) ×1
BEARING HUMERAL SHLDER 36M STD (Shoulder) ×1 IMPLANT
BIT DRILL 2.7 W/STOP DISP (BIT) ×2 IMPLANT
BIT DRILL TWIST 2.7 (BIT) ×2 IMPLANT
BLADE SAW SGTL 13X75X1.27 (BLADE) ×2 IMPLANT
BRNG HUM STD 36 RVRS SHLDR (Shoulder) ×1 IMPLANT
BSPLAT GLND SM AUG TPR ADPR (Joint) ×1 IMPLANT
CHLORAPREP W/TINT 26 (MISCELLANEOUS) ×2 IMPLANT
COOLER ICEMAN CLASSIC (MISCELLANEOUS) ×2 IMPLANT
COVER SURGICAL LIGHT HANDLE (MISCELLANEOUS) ×2 IMPLANT
DRAPE INCISE IOBAN 66X45 STRL (DRAPES) ×2 IMPLANT
DRAPE U-SHAPE 47X51 STRL (DRAPES) ×4 IMPLANT
DRSG AQUACEL AG ADV 3.5X10 (GAUZE/BANDAGES/DRESSINGS) ×2 IMPLANT
ELECT BLADE 4.0 EZ CLEAN MEGAD (MISCELLANEOUS) ×2
ELECT REM PT RETURN 9FT ADLT (ELECTROSURGICAL) ×2
ELECTRODE BLDE 4.0 EZ CLN MEGD (MISCELLANEOUS) ×1 IMPLANT
ELECTRODE REM PT RTRN 9FT ADLT (ELECTROSURGICAL) ×1 IMPLANT
GAUZE SPONGE 4X4 12PLY STRL LF (GAUZE/BANDAGES/DRESSINGS) ×2 IMPLANT
GLENOID SPHERE STD STRL 36MM (Orthopedic Implant) ×2 IMPLANT
GLOVE SRG 8 PF TXTR STRL LF DI (GLOVE) ×1 IMPLANT
GLOVE SURG LTX SZ7 (GLOVE) ×2 IMPLANT
GLOVE SURG LTX SZ8 (GLOVE) ×2 IMPLANT
GLOVE SURG UNDER POLY LF SZ7 (GLOVE) ×2 IMPLANT
GLOVE SURG UNDER POLY LF SZ8 (GLOVE) ×2
GOWN STRL REUS W/ TWL LRG LVL3 (GOWN DISPOSABLE) ×2 IMPLANT
GOWN STRL REUS W/TWL LRG LVL3 (GOWN DISPOSABLE) ×4
GUIDE MODEL REV SHLD RT (ORTHOPEDIC DISPOSABLE SUPPLIES) ×2 IMPLANT
HYDROGEN PEROXIDE 16OZ (MISCELLANEOUS) ×2 IMPLANT
JET LAVAGE IRRISEPT WOUND (IRRIGATION / IRRIGATOR) ×2
KIT BASIN OR (CUSTOM PROCEDURE TRAY) ×2 IMPLANT
KIT TURNOVER KIT B (KITS) ×2 IMPLANT
LAVAGE JET IRRISEPT WOUND (IRRIGATION / IRRIGATOR) ×1 IMPLANT
LOOP VESSEL MAXI BLUE (MISCELLANEOUS) ×2 IMPLANT
MANIFOLD NEPTUNE II (INSTRUMENTS) ×2 IMPLANT
NEEDLE TAPERED W/ NITINOL LOOP (MISCELLANEOUS) ×2 IMPLANT
NS IRRIG 1000ML POUR BTL (IV SOLUTION) ×2 IMPLANT
PACK SHOULDER (CUSTOM PROCEDURE TRAY) ×2 IMPLANT
PAD ARMBOARD 7.5X6 YLW CONV (MISCELLANEOUS) ×4 IMPLANT
PAD COLD SHLDR WRAP-ON (PAD) ×2 IMPLANT
PIN STEINMANN THREADED TIP (PIN) ×2 IMPLANT
PIN THREADED REVERSE (PIN) ×4 IMPLANT
REAMER GUIDE BUSHING SURG DISP (MISCELLANEOUS) ×2 IMPLANT
REAMER GUIDE W/SCREW AUG (MISCELLANEOUS) ×2 IMPLANT
RESTRAINT HEAD UNIVERSAL NS (MISCELLANEOUS) ×2 IMPLANT
RETRIEVER SUT HEWSON (MISCELLANEOUS) ×2 IMPLANT
SCREW BONE STRL 6.5MMX30MM (Screw) ×2 IMPLANT
SCREW LOCKING 4.75MMX15MM (Screw) ×4 IMPLANT
SCREW LOCKING NS 4.75MMX20MM (Screw) ×2 IMPLANT
SCREW LOCKING STRL 4.75X25X3.5 (Screw) ×2 IMPLANT
SHOULDER HUMERAL BEAR 36M STD (Shoulder) ×2 IMPLANT
SLING ARM IMMOBILIZER LRG (SOFTGOODS) ×2 IMPLANT
SOL PREP POV-IOD 4OZ 10% (MISCELLANEOUS) ×2 IMPLANT
SPONGE T-LAP 18X18 ~~LOC~~+RFID (SPONGE) ×2 IMPLANT
STEM HUMERAL STRL 12MMX83MM (Stem) ×2 IMPLANT
STRIP CLOSURE SKIN 1/2X4 (GAUZE/BANDAGES/DRESSINGS) ×2 IMPLANT
SUCTION FRAZIER HANDLE 10FR (MISCELLANEOUS) ×2
SUCTION TUBE FRAZIER 10FR DISP (MISCELLANEOUS) ×1 IMPLANT
SUT BROADBAND TAPE 2PK 1.5 (SUTURE) ×6 IMPLANT
SUT MNCRL AB 3-0 PS2 18 (SUTURE) ×2 IMPLANT
SUT SILK 2 0 TIES 10X30 (SUTURE) ×2 IMPLANT
SUT VIC AB 0 CT1 27 (SUTURE) ×6
SUT VIC AB 0 CT1 27XBRD ANBCTR (SUTURE) ×3 IMPLANT
SUT VIC AB 1 CT1 27 (SUTURE) ×6
SUT VIC AB 1 CT1 27XBRD ANBCTR (SUTURE) ×3 IMPLANT
SUT VIC AB 1 CT1 36 (SUTURE) ×2 IMPLANT
SUT VIC AB 2-0 CT1 27 (SUTURE) ×6
SUT VIC AB 2-0 CT1 TAPERPNT 27 (SUTURE) ×3 IMPLANT
SUT VICRYL 0 UR6 27IN ABS (SUTURE) ×10 IMPLANT
TOWEL GREEN STERILE (TOWEL DISPOSABLE) ×2 IMPLANT
TRAY HUM REV SHOULDER STD +6 (Shoulder) ×2 IMPLANT
WATER STERILE IRR 1000ML POUR (IV SOLUTION) ×2 IMPLANT

## 2020-11-18 NOTE — Transfer of Care (Signed)
Immediate Anesthesia Transfer of Care Note  Patient: Rose Smith  Procedure(s) Performed: RIGHT REVERSE SHOULDER ARTHROPLASTY (Right: Shoulder)  Patient Location: PACU  Anesthesia Type:General  Level of Consciousness: awake, alert , oriented and patient cooperative  Airway & Oxygen Therapy: Patient Spontanous Breathing  Post-op Assessment: Report given to RN and Post -op Vital signs reviewed and stable  Post vital signs: Reviewed and stable  Last Vitals:  Vitals Value Taken Time  BP 102/62 11/18/20 1520  Temp 36.4 C 11/18/20 1520  Pulse 65 11/18/20 1527  Resp 20 11/18/20 1527  SpO2 94 % 11/18/20 1527  Vitals shown include unvalidated device data.  Last Pain:  Vitals:   11/18/20 1520  TempSrc:   PainSc: 0-No pain      Patients Stated Pain Goal: 0 (14/43/60 1658)  Complications: No notable events documented.

## 2020-11-18 NOTE — Anesthesia Procedure Notes (Signed)
Anesthesia Regional Block: Interscalene brachial plexus block   Pre-Anesthetic Checklist: , timeout performed,  Correct Patient, Correct Site, Correct Laterality,  Correct Procedure, Correct Position, site marked,  Risks and benefits discussed,  Surgical consent,  Pre-op evaluation,  At surgeon's request and post-op pain management  Laterality: Upper and Right  Prep: Maximum Sterile Barrier Precautions used, chloraprep       Needles:  Injection technique: Single-shot  Needle Type: Echogenic Needle     Needle Length: 5cm  Needle Gauge: 21     Additional Needles:   Procedures:,,,, ultrasound used (permanent image in chart),,    Narrative:  Start time: 11/18/2020 11:07 AM End time: 11/18/2020 11:14 AM Injection made incrementally with aspirations every 5 mL.  Performed by: Personally  Anesthesiologist: Barnet Glasgow, MD  Additional Notes: Block assessed prior to procedure. Patient tolerated procedure well.

## 2020-11-18 NOTE — Anesthesia Postprocedure Evaluation (Signed)
Anesthesia Post Note  Patient: Rose Smith  Procedure(s) Performed: RIGHT REVERSE SHOULDER ARTHROPLASTY (Right: Shoulder)     Patient location during evaluation: PACU Anesthesia Type: General Level of consciousness: awake and alert Pain management: pain level controlled Vital Signs Assessment: post-procedure vital signs reviewed and stable Respiratory status: spontaneous breathing, nonlabored ventilation, respiratory function stable and patient connected to nasal cannula oxygen Cardiovascular status: blood pressure returned to baseline and stable Postop Assessment: no apparent nausea or vomiting Anesthetic complications: no   No notable events documented.  Last Vitals:  Vitals:   11/18/20 1620 11/18/20 1644  BP: (!) 132/56 133/62  Pulse: (!) 55 60  Resp: 16 16  Temp: 36.4 C (!) 36.4 C  SpO2: 99% 97%    Last Pain:  Vitals:   11/18/20 1644  TempSrc: Oral  PainSc:                  Barnet Glasgow

## 2020-11-18 NOTE — H&P (Signed)
Rose Smith is an 80 y.o. female.   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  She has a history of insulin-dependent diabetes with hemoglobin A1c 6.2.  No cardiac issues.  She has husband at home along with 2 daughters in the region.  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  CT scan is reviewed and it shows adequate bone stock for reverse shoulder replacement.  Past Medical History:  Diagnosis Date   Anemia    Anxiety    mild   Arthritis    lower back   Diabetes mellitus    GERD (gastroesophageal reflux disease)    Mitral valve prolapse    childhood murmur, per patient   Pneumonia    with COVID in 2021    Past Surgical History:  Procedure Laterality Date   ABDOMINAL HYSTERECTOMY     CARPAL TUNNEL RELEASE     left    CHOLECYSTECTOMY N/A 03/14/2017   Procedure: LAPAROSCOPIC CHOLECYSTECTOMY;  Surgeon: Coralie Keens, MD;  Location: [facility];  Service: General;  Laterality: N/A;   EXCISION/RELEASE BURSA HIP  07/26/2011   Procedure: EXCISION/RELEASE BURSA HIP;  Surgeon: Gearlean Alf, MD;  Location: [facility];  Service: Orthopedics;  Laterality: Left;  Left Hip Bursectomy with Gluteal Tendon Repair   EYE SURGERY     bilateral cataracts   ROTATOR CUFF REPAIR     bilateral    TONSILLECTOMY     vaginal wall repair       History reviewed. No pertinent family history. Social History:  reports that she quit smoking about 22 years ago. Her smoking use included cigarettes. She has never used smokeless tobacco. She reports current alcohol use. She reports that she does not use drugs.  Allergies:  Allergies  Allergen Reactions   Cefdinir Hives and Other (See Comments)    Myalgia    Penicillins Rash and Other (See Comments)          Medications Prior to Admission  Medication Sig Dispense Refill   acetaminophen (TYLENOL) 650 MG CR tablet Take 1,300 mg by mouth every 8 (eight) hours as needed for pain.     atorvastatin (LIPITOR) 20 MG tablet Take 20 mg by mouth every other day.     B Complex-C (B-COMPLEX WITH VITAMIN C) tablet Take 1 tablet by mouth 2 (two) times daily.     buPROPion (WELLBUTRIN XL) 300 MG 24 hr tablet Take 300 mg by mouth every morning.     calcium-vitamin D (OSCAL WITH D) 500-200 MG-UNIT tablet Take 1 tablet by mouth daily.     CHROMIUM PICOLINATE PO Take 250 mg by mouth daily.     CINNAMON PO Take 2.5 mLs by mouth daily.     diclofenac sodium (VOLTAREN) 1 % GEL Apply 2 g topically daily as needed (pain).     Dulaglutide (TRULICITY) 5.28 UX/3.2GM SOPN Inject 0.75 mg into the skin once a week.     fluticasone (FLONASE) 50 MCG/ACT nasal spray Place 1 spray into the nose daily as needed for rhinitis.     gemfibrozil (LOPID) 600 MG tablet Take 600 mg by mouth 2 (two) times daily before a meal.      Ginkgo Biloba 120 MG CAPS Take 120 mg by mouth daily.  GLUCOMANNAN PO Take 200 mg by mouth at bedtime.     ketoconazole (NIZORAL) 2 % cream Apply 1 application topically daily as needed for irritation.     latanoprost (XALATAN) 0.005 % ophthalmic solution Place 1 drop into both eyes at bedtime.     MAGNESIUM CITRATE PO Take 250 mg by mouth daily.     Melatonin 5 MG CAPS Take 5 mg by mouth at bedtime.     mirabegron ER (MYRBETRIQ) 25 MG TB24 tablet Take 25 mg by mouth daily.     omeprazole (PRILOSEC) 40 MG capsule Take 40 mg by mouth daily.      polyethylene glycol (MIRALAX / GLYCOLAX) packet Take 17 g by mouth daily.     pregabalin (LYRICA) 75 MG capsule Take 75 mg by mouth 2 (two) times daily.     traMADol (ULTRAM) 50 MG tablet Take 1 tablet (50 mg total) by mouth every 6 (six) hours as needed. 20 tablet 0   Vitamin D, Ergocalciferol, (DRISDOL) 1.25 MG (50000 UNIT) CAPS capsule Take 50,000 Units by mouth  once a week.     clotrimazole (LOTRIMIN) 1 % cream Apply one fingertip amount to the affected area daily. (Patient not taking: Reported on 11/15/2020) 30 g 0   Influenza Vac A&B Surf Ant Adj (FLUAD) 0.5 ML SUSY Fluad 2019-20 25yr up(PF)45 mcg(15 mcgx3)/0.5 mL intramuscular syringe  ADM 0.5ML IM UTD     Tdap (ADACEL) 05-03-13.5 LF-MCG/0.5 injection Inject into the muscle.      Results for orders placed or performed during the hospital encounter of 11/18/20 (from the past 48 hour(s))  Glucose, capillary     Status: None   Collection Time: 11/18/20 10:06 AM  Result Value Ref Range   Glucose-Capillary 79 70 - 99 mg/dL    Comment: Glucose reference range applies only to samples taken after fasting for at least 8 hours.   No results found.  Review of Systems  Musculoskeletal:  Positive for arthralgias.  All other systems reviewed and are negative.  Blood pressure 140/87, pulse (!) 26, temperature 97.8 F (36.6 C), temperature source Oral, resp. rate 19, height 5\' 6"  (1.676 m), weight 73.6 kg, SpO2 97 %. Physical Exam Vitals reviewed.  HENT:     Head: Normocephalic.     Nose: Nose normal.     Mouth/Throat:     Mouth: Mucous membranes are moist.  Eyes:     Pupils: Pupils are equal, round, and reactive to light.  Cardiovascular:     Rate and Rhythm: Normal rate.     Pulses: Normal pulses.  Pulmonary:     Effort: Pulmonary effort is normal.  Abdominal:     General: Abdomen is flat.  Musculoskeletal:     Cervical back: Normal range of motion.  Skin:    General: Skin is warm.     Capillary Refill: Capillary refill takes less than 2 seconds.  Neurological:     General: No focal deficit present.     Mental Status: She is alert.  Psychiatric:        Mood and Affect: Mood normal.    Ortho exam demonstrates good cervical spine range of motion.  Deltoid is functional bilaterally.  Range of motion right shoulder passively is 25/95/140.  Patient has diminished infraspinatus supraspinatus  strength on the right.  Motor sensory function hands intact.  Neck range of motion is full.   Assessment/Plan  Impression is bilateral shoulder rotator cuff arthropathy right worse than left.  Plan is reverse shoulder  replacement.  Models utilized to discussed the risk and benefits of the procedure.  They include not limited to infection nerve vessel damage instability as well as incomplete restoration of desired function and incomplete pain relief.  Patient understands risk and benefits along with the nature of the rehabilitative process.  All questions answered.   Anderson Malta, MD 11/18/2020, 11:23 AM

## 2020-11-18 NOTE — Anesthesia Procedure Notes (Signed)
Procedure Name: Intubation Date/Time: 11/18/2020 12:15 PM Performed by: Reeves Dam, CRNA Pre-anesthesia Checklist: Patient identified, Emergency Drugs available, Suction available and Patient being monitored Patient Re-evaluated:Patient Re-evaluated prior to induction Oxygen Delivery Method: Circle system utilized Preoxygenation: Pre-oxygenation with 100% oxygen Induction Type: IV induction Ventilation: Mask ventilation without difficulty Laryngoscope Size: Miller and 2 Grade View: Grade I Tube type: Oral Laser Tube: Cuffed inflated with minimal occlusive pressure - saline Tube size: 7.5 mm Number of attempts: 1 Airway Equipment and Method: Stylet and Oral airway Placement Confirmation: ETT inserted through vocal cords under direct vision, positive ETCO2 and breath sounds checked- equal and bilateral Secured at: 21 cm Tube secured with: Tape Dental Injury: Teeth and Oropharynx as per pre-operative assessment

## 2020-11-18 NOTE — Anesthesia Procedure Notes (Signed)
Procedure Name: Intubation Date/Time: 11/18/2020 12:10 PM Performed by: Reeves Dam, CRNA Pre-anesthesia Checklist: Patient identified, Emergency Drugs available, Suction available and Patient being monitored Patient Re-evaluated:Patient Re-evaluated prior to induction Oxygen Delivery Method: Circle system utilized Preoxygenation: Pre-oxygenation with 100% oxygen Induction Type: IV induction Ventilation: Mask ventilation without difficulty Laryngoscope Size: Miller and 2 Grade View: Grade I Tube type: Oral Tube size: 7.0 mm Number of attempts: 1 Airway Equipment and Method: Stylet Secured at: 23 cm Tube secured with: Tape Dental Injury: Teeth and Oropharynx as per pre-operative assessment

## 2020-11-18 NOTE — Brief Op Note (Signed)
   11/18/2020  3:28 PM  PATIENT:  Delton Coombes  80 y.o. female  PRE-OPERATIVE DIAGNOSIS:  right shoulder rotator cuff arthropathy  POST-OPERATIVE DIAGNOSIS:  right shoulder rotator cuff arthropathy  PROCEDURE:  Procedure(s): RIGHT REVERSE SHOULDER ARTHROPLASTY  SURGEON:  Surgeon(s): Meredith Pel, MD  ASSISTANT: magnant pa  ANESTHESIA:   general  EBL: 50 ml    Total I/O In: 1025 [I.V.:1025] Out: 50 [Blood:50]  BLOOD ADMINISTERED: none  DRAINS: none   LOCAL MEDICATIONS USED:  vanco  SPECIMEN:  No Specimen  COUNTS:  YES  TOURNIQUET:  * No tourniquets in log *  DICTATION: .Other Dictation: Dictation Number ancef ok no ancef reaction  [id number]  PLAN OF CARE: Admit for overnight observation  PATIENT DISPOSITION:  PACU - hemodynamically stable

## 2020-11-19 DIAGNOSIS — M12811 Other specific arthropathies, not elsewhere classified, right shoulder: Secondary | ICD-10-CM | POA: Diagnosis not present

## 2020-11-19 DIAGNOSIS — Z87891 Personal history of nicotine dependence: Secondary | ICD-10-CM | POA: Diagnosis not present

## 2020-11-19 DIAGNOSIS — Z79899 Other long term (current) drug therapy: Secondary | ICD-10-CM | POA: Diagnosis not present

## 2020-11-19 DIAGNOSIS — E119 Type 2 diabetes mellitus without complications: Secondary | ICD-10-CM | POA: Diagnosis not present

## 2020-11-19 DIAGNOSIS — Z7985 Long-term (current) use of injectable non-insulin antidiabetic drugs: Secondary | ICD-10-CM | POA: Diagnosis not present

## 2020-11-19 LAB — CBC
HCT: 34.3 % — ABNORMAL LOW (ref 36.0–46.0)
Hemoglobin: 11.1 g/dL — ABNORMAL LOW (ref 12.0–15.0)
MCH: 28.9 pg (ref 26.0–34.0)
MCHC: 32.4 g/dL (ref 30.0–36.0)
MCV: 89.3 fL (ref 80.0–100.0)
Platelets: 306 10*3/uL (ref 150–400)
RBC: 3.84 MIL/uL — ABNORMAL LOW (ref 3.87–5.11)
RDW: 14.6 % (ref 11.5–15.5)
WBC: 11.9 10*3/uL — ABNORMAL HIGH (ref 4.0–10.5)
nRBC: 0 % (ref 0.0–0.2)

## 2020-11-19 LAB — BASIC METABOLIC PANEL
Anion gap: 6 (ref 5–15)
BUN: 15 mg/dL (ref 8–23)
CO2: 24 mmol/L (ref 22–32)
Calcium: 8.4 mg/dL — ABNORMAL LOW (ref 8.9–10.3)
Chloride: 106 mmol/L (ref 98–111)
Creatinine, Ser: 0.98 mg/dL (ref 0.44–1.00)
GFR, Estimated: 58 mL/min — ABNORMAL LOW (ref >60)
Glucose, Bld: 114 mg/dL — ABNORMAL HIGH (ref 70–99)
Potassium: 4.4 mmol/L (ref 3.5–5.1)
Sodium: 136 mmol/L (ref 135–145)

## 2020-11-19 NOTE — Op Note (Signed)
NAME: Rose Smith, Rose A. MEDICAL RECORD NO: [id number] ACCOUNT NO: [id number] DATE OF BIRTH: [birth year] FACILITY: [facility] LOCATION: [facility] PHYSICIAN: Yetta Barre. Marlou Sa, MD  Operative Report   DATE OF PROCEDURE: 11/18/2020  PREOPERATIVE DIAGNOSIS:  Right shoulder rotator cuff arthropathy.  POSTOPERATIVE DIAGNOSIS:  Right shoulder rotator cuff arthropathy.  PROCEDURE:  Right shoulder reverse shoulder replacement using Biomet components, mini humeral stem size 12 x 83 with +6 taper offset mini humeral tray, standard thickness, 40 mm in diameter with 36 mm standard highly cross-linked polyethylene bearing  with  small augmented baseplate, one central compression screw, four peripheral locking screws and 36 standard glenosphere.  SURGEON:  Attending Meredith Pel, MD  ASSISTANT:  Annie Main.  INDICATIONS:  The patient is an 80 year old patient with right shoulder pain refractory to nonoperative management, who presents for operative management after explanation of risks and benefits.  The patient has rotator cuff arthropathy with history of  rotator cuff repair and high riding humeral head with adequate glenoid bone stock.  DESCRIPTION OF PROCEDURE:  The patient was brought to the operating room where general endotracheal anesthesia was induced.  Preoperative antibiotics administered.  Timeout was called.  The patient was placed in beach chair position with the head in  neutral position. Right arm, shoulder and hand prescrubbed with hydrogen peroxide followed by alcohol and Betadine, which was allowed to air dry, prepped with ChloraPrep solution and draped in sterile manner.  Ioban used to seal the operative field.  A  timeout was called.  Deltopectoral approach was made.  Cephalic vein mobilized medially.  The IrriSept solution utilized after the incision as well as at multiple times during the case.  The deltoid was elevated manually off its anterior attachment of  the deltoid. Subacromial  and subdeltoid adhesions were released.  Biceps tendon was tenodesed to the pec tendon, which was released along its upper 1.5 cm border.  The biceps tendon was then lifted and the rotator interval was opened.  The biceps tendon  had significantly expanded to about 5 times its normal size.  Next, the axillary nerve was visualized, palpated, and protected at all times during the case.  Circumflex vessels were ligated.  Subscap was detached using a 15 blade.  Two traction sutures  were placed.  Circumferential release of the subscap was performed, taking care to avoid injury to the axillary nerve.  At this time, the head was dislocated.  Some of the repaired rotator cuff was attenuated but intact superiorly.  Next, the opening  reamer was utilized and the humerus was reamed up to a size 12.  Humeral head was cut in line with the native humeral retroversion, which was about 30 degrees.  The cut was made.  Broaching performed up to a size 12, which gave a very secure fit.  A cap  was placed.  Attention was then directed towards the glenoid.  Posterior retractor was placed.  Circumferential removal of the labrum was performed.  Bankart lesion created under direct visualization from the 12 o'clock to 6 o'clock position using the  cautery.  Anterior retractor was placed. Using the patient specific guides and instrumentation, the glenoid guide pin was placed.  Reaming was then performed to the prescribed depth, according to preoperative templating.  Next, the superior reaming was  performed for the augment.  The trial augmented baseplate fit nicely on the glenoid, which had pretty reasonable bone stock.  The augment was about in the 10 o'clock position.  Thorough irrigation was performed and  the true baseplate was placed with good  compression achieved with the compression screw and 4 locking screws were also placed.  Next, the trial reduction was performed with both the +3 and standard glenosphere.  The +3 was  slightly too big and thus a standard glenosphere was placed.  Next,  the true stem was placed, which was a size 12 mini stem.  It should be noted that thorough irrigation with IrriSept in the canal as well as vancomycin powder was placed into the canal prior to placement of the stem.  Also, 6 suture tapes were placed  through the lesser tuberosity.  Stem was placed.  A trial reduction performed with the +6 offset humeral tray and standard liner.  This gave excellent stability with extension and anterior directed force, also with external rotation.  A very secure and  stable construct was created, which was "two fingers tight."  Trial component removed and the true component placed with same stability parameters maintained.  Axillary nerve again palpated and visualized and was intact.  Next, the subscap was repaired  using the suture tapes and the Nice knots.  This was done with the arm externally rotated to about 45 degrees.  At this time, thorough irrigation with IrriSept solution performed through the rotator interval and then vancomycin powder placed into the  rotator interval.  Rotator interval was then closed using #1 Vicryl suture with the arm in 30 degrees of external rotation.  Pouring irrigation then utilized at this time and IrriSept solution and vancomycin powder placed above the deltopectoral  interval, which was closed using #1 Vicryl suture.  Further closure performed using 0 Vicryl suture, 2-0 Vicryl suture, and 3-0 Monocryl with Steri-Strips, Aquacel dressing and shoulder immobilizer applied.  The patient tolerated the procedure well  without immediate complications, transferred to the recovery room in stable condition.  Luke's assistance was required at all times for retraction, opening, closing, mobilization of tissue.  His assistance was a medical necessity.  It should also be  noted that the patient did have a test with Ancef and had no reaction and that was added to the preoperative  antibiotics.   SHW D: 11/18/2020 3:37:18 pm T: 11/18/2020 11:12:00 pm  JOB: [id number]/ [id number]

## 2020-11-19 NOTE — Evaluation (Signed)
Occupational Therapy Evaluation Patient Details Name: Rose Smith MRN: [id number] DOB: [birth year] Today's Date: 11/19/2020   History of Present Illness Rose Smith is a 80 y.o. female s/p right RSA on 11/17. PMH includes insulin-dependent diabetes with hemoglobin A1c 6.2, bilateral rotator cuff repair.   Clinical Impression   Pt admitted with the above diagnoses and presents with below problem list. Pt will benefit from continued acute OT to address the below listed deficits and maximize independence with basic ADLs prior to d/c home. At baseline, pt is independent with ADLs. Pt currently needs min to mod A with UB ADLs, up to min A with LB ADLs, supervision to min guard with functional transfers and functional mobility. Began education on ADL techniques, precautions, and exercises.        Recommendations for follow up therapy are one component of a multi-disciplinary discharge planning process, led by the attending physician.  Recommendations may be updated based on patient status, additional functional criteria and insurance authorization.   Follow Up Recommendations  Follow physician's recommendations for discharge plan and follow up therapies    Assistance Recommended at Discharge Intermittent Supervision/Assistance  Functional Status Assessment  Patient has had a recent decline in their functional status and demonstrates the ability to make significant improvements in function in a reasonable and predictable amount of time.  Equipment Recommendations  None recommended by OT    Recommendations for Other Services PT consult     Precautions / Restrictions Precautions Precautions: Shoulder Shoulder Interventions: Shoulder sling/immobilizer;At all times;Off for dressing/bathing/exercises Precaution Booklet Issued: Yes (comment) Required Braces or Orthoses: Sling Restrictions Weight Bearing Restrictions: Yes RUE Weight Bearing: Non weight bearing      Mobility Bed  Mobility Overal bed mobility: Needs Assistance Bed Mobility: Supine to Sit     Supine to sit: Supervision          Transfers Overall transfer level: Needs assistance Equipment used: None Transfers: Sit to/from Stand Sit to Stand: Supervision                  Balance Overall balance assessment: Mild deficits observed, not formally tested                                         ADL either performed or assessed with clinical judgement   ADL Overall ADL's : Needs assistance/impaired Eating/Feeding: Set up;Sitting   Grooming: Moderate assistance;Sitting;Minimal assistance   Upper Body Bathing: Moderate assistance;Sitting   Lower Body Bathing: Minimal assistance;Sit to/from stand   Upper Body Dressing : Minimal assistance;Sitting   Lower Body Dressing: Minimal assistance;Sit to/from stand   Toilet Transfer: Min guard;Ambulation   Toileting- Clothing Manipulation and Hygiene: Min guard;Sit to/from stand;Sitting/lateral lean       Functional mobility during ADLs: Min guard General ADL Comments: Pt completed in room functional mobility, bed mobility, and simulated toilet transfer. Began education on UB ADLs.     Vision         Perception     Praxis      Pertinent Vitals/Pain Pain Assessment: Faces Faces Pain Scale: Hurts little more Pain Location: L shoulder Pain Descriptors / Indicators: Grimacing Pain Intervention(s): Limited activity within patient's tolerance;Monitored during session;Repositioned     Hand Dominance Left   Extremity/Trunk Assessment Upper Extremity Assessment Upper Extremity Assessment: RUE deficits/detail RUE Deficits / Details: expected deficits s/p RSA. nerve block appears to be wearing off.  Full ROM elbow wrist and hand, discomfort at end range of elbow ROM   Lower Extremity Assessment Lower Extremity Assessment: Defer to PT evaluation       Communication     Cognition Arousal/Alertness:  Awake/alert Behavior During Therapy: WFL for tasks assessed/performed Overall Cognitive Status: Within Functional Limits for tasks assessed                                       General Comments       Exercises Exercises: Other exercises Other Exercises Other Exercises: e/f/w/h AROM seated 10 reps Other Exercises: pendulums standing, min guard for balance. 5 reps. cues for technique   Shoulder Instructions      Home Living Family/patient expects to be discharged to:: Private residence Living Arrangements: Spouse/significant other Available Help at Discharge: Family;Other (Comment) (spouse, adult children, grandchildren, friend) Type of Home: House Home Access: Stairs to enter Technical brewer of Steps: 2 Entrance Stairs-Rails: None Home Layout: One level     Bathroom Shower/Tub: Walk-in shower         Home Equipment: Kasandra Knudsen - single point;Shower seat          Prior Functioning/Environment Prior Level of Function : Independent/Modified Independent                        OT Problem List: Impaired balance (sitting and/or standing);Decreased knowledge of use of DME or AE;Decreased knowledge of precautions;Pain      OT Treatment/Interventions: Self-care/ADL training;Therapeutic exercise;DME and/or AE instruction;Therapeutic activities;Patient/family education;Balance training    OT Goals(Current goals can be found in the care plan section) Acute Rehab OT Goals Patient Stated Goal: independence, pain management OT Goal Formulation: With patient Time For Goal Achievement: 12/03/20 Potential to Achieve Goals: Good  OT Frequency: Min 3X/week   Barriers to D/C: Other (comment)  spouse has dementia       Co-evaluation              AM-PAC OT "6 Clicks" Daily Activity     Outcome Measure Help from another person eating meals?: A Little Help from another person taking care of personal grooming?: A Little Help from another person  toileting, which includes using toliet, bedpan, or urinal?: A Little Help from another person bathing (including washing, rinsing, drying)?: A Little Help from another person to put on and taking off regular upper body clothing?: A Little Help from another person to put on and taking off regular lower body clothing?: A Little 6 Click Score: 18   End of Session Equipment Utilized During Treatment: Other (comment) (sling) Nurse Communication: Mobility status  Activity Tolerance: Patient tolerated treatment well Patient left: Other (comment);with family/visitor present (with PT)  OT Visit Diagnosis: Unsteadiness on feet (R26.81);Pain Pain - Right/Left: Right Pain - part of body: Shoulder                Time: 7824-2353 OT Time Calculation (min): 30 min Charges:  OT General Charges $OT Visit: 1 Visit OT Evaluation $OT Eval Low Complexity: 1 Low OT Treatments $Self Care/Home Management : 8-22 mins  Tyrone Schimke, OT Acute Rehabilitation Services Pager: [phone] Office: [phone]   Hortencia Pilar 11/19/2020, 10:10 AM

## 2020-11-19 NOTE — Plan of Care (Signed)
  Problem: Education: Goal: Knowledge of the prescribed therapeutic regimen will improve Outcome: Progressing   Problem: Activity: Goal: Ability to tolerate increased activity will improve Outcome: Progressing   Problem: Pain Management: Goal: Pain level will decrease with appropriate interventions Outcome: Progressing   Problem: Clinical Measurements: Goal: Will remain free from infection Outcome: Progressing

## 2020-11-19 NOTE — Progress Notes (Signed)
  Subjective: Rose Smith is a 80 y.o. female s/p right RSA.  They are POD1.  Pt's pain is controlled.  Denies any chest pain or chest discomfort.  She does report some difficulty taking a deep breath but this has been present since her block and feels like it is improving according to her.  She does note some lower back pain with radiation to her right hip but she has a history of back pain in the past and this is not causing her significant distress.  Block is wearing off..    Objective: Vital signs in last 24 hours: Temp:  [97.5 F (36.4 C)-98.5 F (36.9 C)] 98.2 F (36.8 C) (11/18 0823) Pulse Rate:  [26-115] 54 (11/18 0823) Resp:  [11-22] 17 (11/18 0823) BP: (102-141)/(49-97) 115/97 (11/18 0823) SpO2:  [92 %-100 %] 94 % (11/18 0823) Weight:  [73.6 kg] 73.6 kg (11/17 1010)  Intake/Output from previous day: 11/17 0701 - 11/18 0700 In: 1660 [P.O.:480; I.V.:1180] Out: 50 [Blood:50] Intake/Output this shift: No intake/output data recorded.  Exam:  No gross blood or drainage overlying the dressing 2+ radial pulse Sensation intact distally in the right hand.  Numbness in the right index and right thumb. Able to extend the right wrist.  Intact EPL, FPL, finger abduction, finger adduction.  Intact bicep, tricep, deltoid with axillary nerve intact with deltoid firing.   Labs: Recent Labs    11/16/20 1400 11/19/20 0221  HGB 13.1 11.1*   Recent Labs    11/16/20 1400 11/19/20 0221  WBC 7.8 11.9*  RBC 4.56 3.84*  HCT 41.5 34.3*  PLT 384 306   Recent Labs    11/16/20 1400 11/19/20 0221  NA 138 136  K 3.8 4.4  CL 105 106  CO2 25 24  BUN 17 15  CREATININE 0.95 0.98  GLUCOSE 92 114*  CALCIUM 9.0 8.4*   No results for input(s): LABPT, INR in the last 72 hours.  Assessment/Plan: Pt is POD 1 s/p right RSA    -Plan to discharge to home tomorrow pending patient's pain  -No lifting with the operative arm  -Plan for discharge home tomorrow and patient will have her  husband able to assist her.  Her husband does have dementia so she wants to hold off on next day discharge due to this reason.  Overall she seems comfortable in bed today and no immediate concerns.     Luciano Cinquemani L Daveda Larock 11/19/2020, 8:42 AM

## 2020-11-19 NOTE — Evaluation (Addendum)
Physical Therapy Evaluation/ Discharge Patient Details Name: Rose Smith MRN: [id number] DOB: [birth year] Today's Date: 11/19/2020  History of Present Illness  Rose Smith is a 80 y.o. female s/p right RSA on 11/17. PMHx: DM, arthritis, GERD, bilateral rotator cuff repair.  Clinical Impression  Pt very pleasant and moving well after OT eval. Pt reports no difficulty with bed mobility and feeling generally unsteady post sedation but without balance deficits with mobility. Pt noting SOB with SPO2 90-91% throughout session on RA with pt educated for incentive spirometer use. Pt able to perform basice transfers and gait without assist and will have help of spouse for stair ambulation initially at D/C. PT does not require further P.T. intervention at this time and will defer to OT. Pt aware and agreeable.        Recommendations for follow up therapy are one component of a multi-disciplinary discharge planning process, led by the attending physician.  Recommendations may be updated based on patient status, additional functional criteria and insurance authorization.  Follow Up Recommendations No PT follow up    Assistance Recommended at Discharge Intermittent Supervision/Assistance  Functional Status Assessment Patient has not had a recent decline in their functional status  Equipment Recommendations  None recommended by PT    Recommendations for Other Services       Precautions / Restrictions Precautions Precautions: Shoulder Shoulder Interventions: Shoulder sling/immobilizer;At all times;Off for dressing/bathing/exercises Precaution Booklet Issued: Yes (comment) Required Braces or Orthoses: Sling Restrictions Weight Bearing Restrictions: Yes RUE Weight Bearing: Non weight bearing      Mobility  Bed Mobility       General bed mobility comments: standing with OT on arrival    Transfers Overall transfer level: Modified independent                  Ambulation/Gait Ambulation/Gait assistance: Supervision Gait Distance (Feet): 300 Feet Assistive device: None Gait Pattern/deviations: WFL(Within Functional Limits)   Gait velocity interpretation: >2.62 ft/sec, indicative of community ambulatory   General Gait Details: supervision for IV pole and directional cues only. PT with steady gait and reports DOE with SpO2 90% on RA  Stairs Stairs: Yes Stairs assistance: Min guard Stair Management: Step to pattern;Forwards Number of Stairs: 3 General stair comments: HHA to complete stairs as pt without rail at home  Wheelchair Mobility    Modified Rankin (Stroke Patients Only)       Balance Overall balance assessment: No apparent balance deficits (not formally assessed)                                           Pertinent Vitals/Pain Pain Assessment: Faces Faces Pain Scale: Hurts a little bit Pain Location: L shoulder Pain Descriptors / Indicators: Aching Pain Intervention(s): Limited activity within patient's tolerance;Monitored during session;Repositioned    Home Living Family/patient expects to be discharged to:: Private residence Living Arrangements: Spouse/significant other Available Help at Discharge: Family (spouse, adult children, grandchildren, friend) Type of Home: House Home Access: Stairs to enter Entrance Stairs-Rails: None Entrance Stairs-Number of Steps: 2   Home Layout: One level Home Equipment: Cane - single point;Shower seat      Prior Function Prior Level of Function : Independent/Modified Independent                     Hand Dominance   Dominant Hand: Left    Extremity/Trunk Assessment  Upper Extremity Assessment Upper Extremity Assessment: Defer to OT evaluation     Lower Extremity Assessment Lower Extremity Assessment: Overall WFL for tasks assessed    Cervical / Trunk Assessment Cervical / Trunk Assessment: Normal  Communication      Cognition  Arousal/Alertness: Awake/alert Behavior During Therapy: WFL for tasks assessed/performed Overall Cognitive Status: Within Functional Limits for tasks assessed                                          General Comments          Assessment/Plan    PT Assessment Patient does not need any further PT services  PT Problem List         PT Treatment Interventions      PT Goals (Current goals can be found in the Care Plan section)  Acute Rehab PT Goals PT Goal Formulation: All assessment and education complete, DC therapy    Frequency     Barriers to discharge        Co-evaluation               AM-PAC PT "6 Clicks" Mobility  Outcome Measure Help needed turning from your back to your side while in a flat bed without using bedrails?: None Help needed moving from lying on your back to sitting on the side of a flat bed without using bedrails?: None Help needed moving to and from a bed to a chair (including a wheelchair)?: None Help needed standing up from a chair using your arms (e.g., wheelchair or bedside chair)?: None Help needed to walk in hospital room?: A Little Help needed climbing 3-5 steps with a railing? : A Little 6 Click Score: 22    End of Session   Activity Tolerance: Patient tolerated treatment well Patient left: in chair;with call bell/phone within reach;with family/visitor present Nurse Communication: Mobility status PT Visit Diagnosis: Other abnormalities of gait and mobility (R26.89)    Time: 5170-0174 PT Time Calculation (min) (ACUTE ONLY): 17 min   Charges:   PT Evaluation $PT Eval Low Complexity: [place], PT Acute Rehabilitation Services Pager: [phone] Office: [place] 11/19/2020, 10:58 AM

## 2020-11-20 DIAGNOSIS — Z7985 Long-term (current) use of injectable non-insulin antidiabetic drugs: Secondary | ICD-10-CM | POA: Diagnosis not present

## 2020-11-20 DIAGNOSIS — Z79899 Other long term (current) drug therapy: Secondary | ICD-10-CM | POA: Diagnosis not present

## 2020-11-20 DIAGNOSIS — Z87891 Personal history of nicotine dependence: Secondary | ICD-10-CM | POA: Diagnosis not present

## 2020-11-20 DIAGNOSIS — M19011 Primary osteoarthritis, right shoulder: Secondary | ICD-10-CM | POA: Diagnosis not present

## 2020-11-20 DIAGNOSIS — E119 Type 2 diabetes mellitus without complications: Secondary | ICD-10-CM | POA: Diagnosis not present

## 2020-11-20 DIAGNOSIS — M12811 Other specific arthropathies, not elsewhere classified, right shoulder: Secondary | ICD-10-CM | POA: Diagnosis not present

## 2020-11-20 LAB — CBC
HCT: 34.4 % — ABNORMAL LOW (ref 36.0–46.0)
Hemoglobin: 10.9 g/dL — ABNORMAL LOW (ref 12.0–15.0)
MCH: 28.2 pg (ref 26.0–34.0)
MCHC: 31.7 g/dL (ref 30.0–36.0)
MCV: 89.1 fL (ref 80.0–100.0)
Platelets: 295 10*3/uL (ref 150–400)
RBC: 3.86 MIL/uL — ABNORMAL LOW (ref 3.87–5.11)
RDW: 14.6 % (ref 11.5–15.5)
WBC: 9.9 10*3/uL (ref 4.0–10.5)
nRBC: 0 % (ref 0.0–0.2)

## 2020-11-20 MED ORDER — ASPIRIN 81 MG PO TBEC
81.0000 mg | DELAYED_RELEASE_TABLET | Freq: Every day | ORAL | 11 refills | Status: AC
Start: 2020-11-21 — End: ?

## 2020-11-20 MED ORDER — METHOCARBAMOL 500 MG PO TABS: 500.0000 mg | ORAL_TABLET | Freq: Four times a day (QID) | ORAL | 0 refills | Status: DC | PRN

## 2020-11-20 MED ORDER — ONDANSETRON HCL 4 MG PO TABS
4.0000 mg | ORAL_TABLET | Freq: Four times a day (QID) | ORAL | 0 refills | Status: DC | PRN
Start: 2020-11-20 — End: 2021-01-25

## 2020-11-20 MED ORDER — HYDROCODONE-ACETAMINOPHEN 5-325 MG PO TABS: 1.0000 | ORAL_TABLET | ORAL | 0 refills | Status: DC | PRN

## 2020-11-20 NOTE — Plan of Care (Signed)
  Problem: Education: Goal: Knowledge of the prescribed therapeutic regimen will improve Outcome: Progressing Goal: Understanding of activity limitations/precautions following surgery will improve Outcome: Progressing   Problem: Activity: Goal: Ability to tolerate increased activity will improve Outcome: Progressing   Problem: Pain Management: Goal: Pain level will decrease with appropriate interventions Outcome: Progressing   Problem: Health Behavior/Discharge Planning: Goal: Ability to manage health-related needs will improve Outcome: Progressing   Problem: Clinical Measurements: Goal: Ability to maintain clinical measurements within normal limits will improve Outcome: Progressing Goal: Will remain free from infection Outcome: Progressing Goal: Respiratory complications will improve Outcome: Progressing

## 2020-11-20 NOTE — Progress Notes (Signed)
AVS d/c teaching provided to pt at bedside. Pt has verbalized understanding of teaching at this time. Pt has collected her belongings and IV was removed prior to d/c.

## 2020-11-20 NOTE — Progress Notes (Signed)
  Subjective: Patient stable.  Block is worn off but pain is reasonably well controlled.   Objective: Vital signs in last 24 hours: Temp:  [97.6 F (36.4 C)-99.7 F (37.6 C)] 97.8 F (36.6 C) (11/19 0812) Pulse Rate:  [56-84] 64 (11/19 0812) Resp:  [16-18] 16 (11/19 0812) BP: (105-151)/(45-62) 113/45 (11/19 0812) SpO2:  [90 %-95 %] 93 % (11/19 0812)  Intake/Output from previous day: No intake/output data recorded. Intake/Output this shift: No intake/output data recorded.  Exam:  Intact pulses distally Compartment soft  Labs: Recent Labs    11/19/20 0221 11/20/20 0204  HGB 11.1* 10.9*   Recent Labs    11/19/20 0221 11/20/20 0204  WBC 11.9* 9.9  RBC 3.84* 3.86*  HCT 34.3* 34.4*  PLT 306 295   Recent Labs    11/19/20 0221  NA 136  K 4.4  CL 106  CO2 24  BUN 15  CREATININE 0.98  GLUCOSE 114*  CALCIUM 8.4*   No results for input(s): LABPT, INR in the last 72 hours.  Assessment/Plan: Plan at this time is discharge to home.  CPM machine 1 hour 3 times a day.  Follow-up in 10 days.   Rose Smith 11/20/2020, 10:06 AM

## 2020-11-21 DIAGNOSIS — M19011 Primary osteoarthritis, right shoulder: Secondary | ICD-10-CM

## 2020-11-23 ENCOUNTER — Encounter (HOSPITAL_COMMUNITY): Payer: Self-pay | Admitting: Orthopedic Surgery

## 2020-11-25 NOTE — Discharge Summary (Signed)
Physician Discharge Summary      Patient ID: Rose Smith MRN: [id number] DOB/AGE: 82-18-1942 82 y.o.  Admit date: 11/18/2020 Discharge date: 11/20/2020  Admission Diagnoses:  Principal Problem:   S/P reverse total shoulder arthroplasty, right Active Problems:   Arthritis of right shoulder region   Discharge Diagnoses:  Same  Surgeries: Procedure(s): RIGHT REVERSE SHOULDER ARTHROPLASTY on 11/18/2020   Consultants:   Discharged Condition: Stable  Hospital Course: Rose Smith is an 80 y.o. 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  They were brought to the operating room on 11/18/2020 and underwent the above named procedures.  Pt awoke from anesthesia without complication and was transferred to the floor. On POD1, patient's pain was controlled.  She had shortness of breath mildly that was present since the block was put in.  This was rapidly improving throughout the course of the morning.  She felt well and did not appear sick.  She was able to ambulate around her room.  She wanted to stay a second day due to her husband's dementia and decreased ability to assist her.  On POD 2, pain was controlled overall and she had no new complaints.  She was discharged home on POD 2.  Pt will f/u with Dr. Marlou Sa in clinic in ~2 weeks.   Antibiotics given:  Anti-infectives (From admission, onward)    Start     Dose/Rate Route Frequency Ordered Stop   11/18/20 2000  ceFAZolin (ANCEF) IVPB 1 g/50 mL premix        1 g 100 mL/hr over 30 Minutes Intravenous Every 8 hours 11/18/20 1642 11/19/20 1220   11/18/20 1248  vancomycin (VANCOCIN) powder  Status:  Discontinued          As needed 11/18/20 1248 11/18/20 1519   11/18/20 0845  vancomycin (VANCOCIN) IVPB 1000 mg/200 mL premix        1,000 mg 200 mL/hr over 60 Minutes Intravenous On call to O.R. 11/18/20 3151 11/18/20 1217      .  Recent vital signs:  Vitals:   11/19/20 1947 11/20/20 0812  BP: (!) 151/62 (!) 113/45  Pulse: 84 64  Resp: 18 16  Temp: 99.7 F (37.6 C) 97.8 F (36.6 C)  SpO2: 91% 93%    Recent laboratory studies:  Results for orders placed or performed during the hospital encounter of 11/18/20  Glucose, capillary  Result Value Ref Range   Glucose-Capillary 79 70 - 99 mg/dL  Glucose, capillary  Result Value Ref Range   Glucose-Capillary 92 70 - 99 mg/dL  CBC  Result Value Ref Range   WBC 11.9 (H) 4.0 - 10.5 K/uL   RBC 3.84 (L) 3.87 - 5.11 MIL/uL   Hemoglobin 11.1 (L) 12.0 - 15.0 g/dL   HCT 34.3 (L) 36.0 - 46.0 %   MCV 89.3 80.0 - 100.0 fL   MCH 28.9 26.0 - 34.0 pg   MCHC 32.4 30.0 - 36.0 g/dL   RDW 14.6 11.5 - 15.5 %   Platelets 306 150 - 400 K/uL   nRBC 0.0 0.0 - 0.2 %  Basic metabolic panel  Result Value Ref Range   Sodium 136 135 - 145 mmol/L   Potassium 4.4 3.5 - 5.1 mmol/L   Chloride 106 98 - 111 mmol/L   CO2 24 22 - 32 mmol/L   Glucose, Bld 114 (H) 70 - 99 mg/dL   BUN 15 8 -  23 mg/dL   Creatinine, Ser 0.98 0.44 - 1.00 mg/dL   Calcium 8.4 (L) 8.9 - 10.3 mg/dL   GFR, Estimated 58 (L) >60 mL/min   Anion gap 6 5 - 15  CBC  Result Value Ref Range   WBC 9.9 4.0 - 10.5 K/uL   RBC 3.86 (L) 3.87 - 5.11 MIL/uL   Hemoglobin 10.9 (L) 12.0 - 15.0 g/dL   HCT 34.4 (L) 36.0 - 46.0 %   MCV 89.1 80.0 - 100.0 fL   MCH 28.2 26.0 - 34.0 pg   MCHC 31.7 30.0 - 36.0 g/dL   RDW 14.6 11.5 - 15.5 %   Platelets 295 150 - 400 K/uL   nRBC 0.0 0.0 - 0.2 %    Discharge Medications:   Allergies as of 11/20/2020       Reactions   Cefdinir Hives, Other (See Comments)   Myalgia   Penicillins Rash, Other (See Comments)           Medication List     STOP taking these medications    CHROMIUM PICOLINATE PO   Fluad 0.5 ML Susy Generic drug: Influenza Vac A&B Surf Ant Adj   Tdap 05-03-13.5 LF-MCG/0.5 injection Commonly known as: ADACEL       TAKE these medications     acetaminophen 650 MG CR tablet Commonly known as: TYLENOL Take 1,300 mg by mouth every 8 (eight) hours as needed for pain.   aspirin 81 MG EC tablet Take 1 tablet (81 mg total) by mouth daily. Swallow whole.   atorvastatin 20 MG tablet Commonly known as: LIPITOR Take 20 mg by mouth every other day.   B-complex with vitamin C tablet Take 1 tablet by mouth 2 (two) times daily.   buPROPion 300 MG 24 hr tablet Commonly known as: WELLBUTRIN XL Take 300 mg by mouth every morning.   calcium-vitamin D 500-200 MG-UNIT tablet Commonly known as: OSCAL WITH D Take 1 tablet by mouth daily.   CINNAMON PO Take 2.5 mLs by mouth daily.   clotrimazole 1 % cream Commonly known as: LOTRIMIN Apply one fingertip amount to the affected area daily.   diclofenac sodium 1 % Gel Commonly known as: VOLTAREN Apply 2 g topically daily as needed (pain).   fluticasone 50 MCG/ACT nasal spray Commonly known as: FLONASE Place 1 spray into the nose daily as needed for rhinitis.   gemfibrozil 600 MG tablet Commonly known as: LOPID Take 600 mg by mouth 2 (two) times daily before a meal.   Ginkgo Biloba 120 MG Caps Take 120 mg by mouth daily.   GLUCOMANNAN PO Take 200 mg by mouth at bedtime.   HYDROcodone-acetaminophen 5-325 MG tablet Commonly known as: NORCO/VICODIN Take 1-2 tablets by mouth every 4 (four) hours as needed for moderate pain (pain score 4-6).   ketoconazole 2 % cream Commonly known as: NIZORAL Apply 1 application topically daily as needed for irritation.   latanoprost 0.005 % ophthalmic solution Commonly known as: XALATAN Place 1 drop into both eyes at bedtime.   MAGNESIUM CITRATE PO Take 250 mg by mouth daily.   Melatonin 5 MG Caps Take 5 mg by mouth at bedtime.   methocarbamol 500 MG tablet Commonly known as: ROBAXIN Take 1 tablet (500 mg total) by mouth every 6 (six) hours as needed for muscle spasms.   mirabegron ER 25 MG Tb24 tablet Commonly known as:  MYRBETRIQ Take 25 mg by mouth daily.   omeprazole 40 MG capsule Commonly known as: PRILOSEC Take 40  mg by mouth daily.   ondansetron 4 MG tablet Commonly known as: ZOFRAN Take 1 tablet (4 mg total) by mouth every 6 (six) hours as needed for nausea.   polyethylene glycol 17 g packet Commonly known as: MIRALAX / GLYCOLAX Take 17 g by mouth daily.   pregabalin 75 MG capsule Commonly known as: LYRICA Take 75 mg by mouth 2 (two) times daily.   traMADol 50 MG tablet Commonly known as: ULTRAM Take 1 tablet (50 mg total) by mouth every 6 (six) hours as needed.   Trulicity 6.96 EX/5.2WU Sopn Generic drug: Dulaglutide Inject 0.75 mg into the skin once a week.   Vitamin D (Ergocalciferol) 1.25 MG (50000 UNIT) Caps capsule Commonly known as: DRISDOL Take 50,000 Units by mouth once a week.        Diagnostic Studies: DG Shoulder Right Port  Result Date: 11/18/2020 CLINICAL DATA:  Status post shoulder surgery. EXAM: PORTABLE RIGHT SHOULDER COMPARISON:  Right shoulder x-ray 09/30/2018. FINDINGS: There is a new right shoulder reverse arthroplasty in anatomic alignment. There is no fracture. Joint spaces are maintained. There is right shoulder soft tissue swelling and air compatible with recent surgery. There is minimal atelectasis in the right lung base. IMPRESSION: 1. New right shoulder arthroplasty in anatomic alignment. Electronically Signed   By: Ronney Asters M.D.   On: 11/18/2020 16:48    Disposition: Discharge disposition: 01-Home or Self Care       Discharge Instructions     Call MD / Call 911   Complete by: As directed    If you experience chest pain or shortness of breath, CALL 911 and be transported to the hospital emergency room.  If you develope a fever above 101 F, pus (white drainage) or increased drainage or redness at the wound, or calf pain, call your surgeon's office.   Constipation Prevention   Complete by: As directed    Drink plenty of fluids.  Prune juice  may be helpful.  You may use a stool softener, such as Colace (over the counter) 100 mg twice a day.  Use MiraLax (over the counter) for constipation as needed.   Diet - low sodium heart healthy   Complete by: As directed    Discharge instructions   Complete by: As directed    Use black shoulder motion machine 1 hour 3 times a day.  Increase the degrees daily if possible.  Use the ice machine after using the motion machine. Okay to shower dressing waterproof Follow-up in approximately 10 days   Increase activity slowly as tolerated   Complete by: As directed    Post-operative opioid taper instructions:   Complete by: As directed    POST-OPERATIVE OPIOID TAPER INSTRUCTIONS: It is important to wean off of your opioid medication as soon as possible. If you do not need pain medication after your surgery it is ok to stop day one. Opioids include: Codeine, Hydrocodone(Norco, Vicodin), Oxycodone(Percocet, oxycontin) and hydromorphone amongst others.  Long term and even short term use of opiods can cause: Increased pain response Dependence Constipation Depression Respiratory depression And more.  Withdrawal symptoms can include Flu like symptoms Nausea, vomiting And more Techniques to manage these symptoms Hydrate well Eat regular healthy meals Stay active Use relaxation techniques(deep breathing, meditating, yoga) Do Not substitute Alcohol to help with tapering If you have been on opioids for less than two weeks and do not have pain than it is ok to stop all together.  Plan to wean off of opioids This  plan should start within one week post op of your joint replacement. Maintain the same interval or time between taking each dose and first decrease the dose.  Cut the total daily intake of opioids by one tablet each day Next start to increase the time between doses. The last dose that should be eliminated is the evening dose.             SignedDonella Stade 11/25/2020,  7:41 AM

## 2020-11-26 NOTE — Addendum Note (Signed)
Encounter addended by: Tania Ade on: 11/26/2020 1:04 PM  Actions taken: Imaging Exam ended

## 2020-11-29 ENCOUNTER — Ambulatory Visit: Payer: Medicare Other | Admitting: Cardiology

## 2020-11-29 ENCOUNTER — Encounter: Payer: Self-pay | Admitting: Cardiology

## 2020-11-29 ENCOUNTER — Other Ambulatory Visit: Payer: Self-pay

## 2020-11-29 VITALS — BP 124/62 | HR 67 | Ht 66.0 in | Wt 160.4 lb

## 2020-11-29 DIAGNOSIS — Z8679 Personal history of other diseases of the circulatory system: Secondary | ICD-10-CM | POA: Diagnosis not present

## 2020-11-29 DIAGNOSIS — R001 Bradycardia, unspecified: Secondary | ICD-10-CM | POA: Diagnosis not present

## 2020-11-29 DIAGNOSIS — E1169 Type 2 diabetes mellitus with other specified complication: Secondary | ICD-10-CM

## 2020-11-29 DIAGNOSIS — E785 Hyperlipidemia, unspecified: Secondary | ICD-10-CM | POA: Diagnosis not present

## 2020-11-29 DIAGNOSIS — I493 Ventricular premature depolarization: Secondary | ICD-10-CM

## 2020-11-29 NOTE — Assessment & Plan Note (Signed)
Physiologically she is bradycardic despite heart rate being in the 60s says she is having bigeminy.  Follow-up Zio patch monitor.  At present, no indication for PPM.

## 2020-11-29 NOTE — Assessment & Plan Note (Signed)
Well-controlled on current dose statin.  No change. Most recent A1c 5.7.  Well-controlled.

## 2020-11-29 NOTE — Progress Notes (Signed)
Primary Care Provider: Kelton Pillar, MD [facility] Cardiologist: None Electrophysiologist: None  Clinic Note: Chief Complaint  Patient presents with   New Patient (Initial Visit)    ===================================  ASSESSMENT/PLAN   Problem List Items Addressed This Visit       Cardiology Problems   Hyperlipidemia associated with type 2 diabetes mellitus ([place]) (Chronic)    Well-controlled on current dose statin.  No change. Most recent A1c 5.7.  Well-controlled.      Frequent unifocal PVCs - Primary (Chronic)    Significant PVCs mostly unifocal, but some fusion beats also noted on EKG.  Mostly asymptomatic.  Check 2D echo and 7-day Zio patch monitor.  Pending results-potential wall motion abnormality versus high PVC burden, would consider ischemic evaluation.      Relevant Orders   EKG 12-Lead   LONG TERM MONITOR (3-14 DAYS)   ECHOCARDIOGRAM COMPLETE     Other   Bradycardia (Chronic)    Physiologically she is bradycardic despite heart rate being in the 60s says she is having bigeminy.  Follow-up Zio patch monitor.  At present, no indication for PPM.      Relevant Orders   EKG 12-Lead   LONG TERM MONITOR (3-14 DAYS)   ECHOCARDIOGRAM COMPLETE   H/O mitral valve prolapse (Chronic)    Diagnosis was for a longer period she says she had an echocardiogram done years ago, but cannot find results.  Check 2D echo      Relevant Orders   EKG 12-Lead   LONG TERM MONITOR (3-14 DAYS)   ECHOCARDIOGRAM COMPLETE   Palpitations  Mitral valve prolapse  -zio patch 7 day monitor -echo given prior diagnosis of mitral valve prolapse   Hyperlipidemia -most recent lipid panel notable for cholesterol 135, HDL 44 and LDL 55; excellent control. -continue atorvastatin   Follow up in 2 months   Signed:  Donney Dice, DO - [facility]  Attested:  Glenetta Hew, MD   ===================================  HPI:    Rose Smith is a 80 y.o. female with  PMH notable for DM-2 (last A1c 6.4), HTN, HLD, and previous diagnosis of MITRAL PROLAPSE who is being seen today for the evaluation of IRREGULAR HEARTBEAT, and BRADYCARDIA at the request of Kelton Pillar, MD.  Rose Smith is the wife of Rose Smith (also a patient of Dr. Selena Batten).  Rose Smith was referred to by her PCP regarding extrasystole, and bradycardia.  She was seen by Dr. Laurann Montana on August 12, 2020.  She is found to have irregular heartbeat (auscultated as trigeminy) in and she noted having bradycardia with heart rates in the 30s at night.  She was referred to cardiology  Recent Hospitalizations:  11/18/2020: Right Reverse Shoulder Arthroplasty (Dr. Marlou Sa).  Procedure went well.  Still has some soreness now.  Otherwise doing fine.  Reviewed  CV studies:    The following studies were reviewed today: (if available, images/films reviewed: From Epic Chart or Care Everywhere) None  Interval History:   Rose Smith is a 80 year old female with a past medical history of hyperlipidemia and diabetes presents for palpitations. Referred to by her PCP regarding extrasystole and palpitations. She has been experiencing palpitations for 5 years now, noticed bradycardia since she bought her apple watch 5 years ago. Then PCP noted extrasystole at most recent wellness visit and referred her to cardiology. Denies chest pain, dyspnea and other symptoms. She is very active and exercises regularly, somewhat limited by recent shoulder arthoplasty.   CV Review  of Symptoms (Summary) Cardiovascular ROS: positive for - dyspnea on exertion, edema, irregular heartbeat, shortness of breath, and --just notes irregular heartbeat & slow HR - worse when @ rest - esp. Lying down @night .  negative for - chest pain, loss of consciousness, orthopnea, paroxysmal nocturnal dyspnea, rapid heart rate, or -> syncope/near syncope or TIA/amaurosis fugax, claudication     REVIEWED OF SYSTEMS   Review of Systems  Eyes:   Negative for blurred vision and double vision.  Respiratory:  Negative for shortness of breath and wheezing.   Cardiovascular:  Positive for palpitations. Negative for chest pain, orthopnea and leg swelling.  Gastrointestinal:  Negative for nausea and vomiting.  Musculoskeletal:  Negative for falls.  Neurological:  Negative for dizziness, weakness and headaches.  -- Also has right shoulder pain following surgery.  Somewhat stiff with limited mobility.  I have reviewed and (if needed) personally updated the patient's problem list, medications, allergies, past medical and surgical history, social and family history.   PAST MEDICAL HISTORY   Past Medical History:  Diagnosis Date   Anemia    Anxiety    mild   Arthritis    lower back   Diabetes mellitus type 2, controlled, without complications (HCC)    GERD (gastroesophageal reflux disease)    Hyperlipidemia    Mitral valve prolapse    childhood murmur, per patient   Pneumonia    with COVID in 2021    PAST SURGICAL HISTORY   Past Surgical History:  Procedure Laterality Date   ABDOMINAL HYSTERECTOMY     CARPAL TUNNEL RELEASE     left    CHOLECYSTECTOMY N/A 03/14/2017   Procedure: LAPAROSCOPIC CHOLECYSTECTOMY;  Surgeon: Coralie Keens, MD;  Location: [facility];  Service: General;  Laterality: N/A;   EXCISION/RELEASE BURSA HIP  07/26/2011   Procedure: EXCISION/RELEASE BURSA HIP;  Surgeon: Gearlean Alf, MD;  Location: [facility];  Service: Orthopedics;  Laterality: Left;  Left Hip Bursectomy with Gluteal Tendon Repair   EYE SURGERY     bilateral cataracts   REVERSE SHOULDER ARTHROPLASTY Right 11/18/2020   Procedure: RIGHT REVERSE SHOULDER ARTHROPLASTY;  Surgeon: Meredith Pel, MD;  Location: [facility];  Service: Orthopedics;  Laterality: Right;   ROTATOR CUFF REPAIR     bilateral    TONSILLECTOMY     vaginal wall repair       Immunization History  Administered Date(s) Administered   Tdap 09/02/2015     MEDICATIONS/ALLERGIES   Current Meds  Medication Sig   acetaminophen (TYLENOL) 650 MG CR tablet Take 1,300 mg by mouth every 8 (eight) hours as needed for pain.   aspirin EC 81 MG EC tablet Take 1 tablet (81 mg total) by mouth daily. Swallow whole.   atorvastatin (LIPITOR) 20 MG tablet Take 20 mg by mouth every other day.   B Complex-C (B-COMPLEX WITH VITAMIN C) tablet Take 1 tablet by mouth 2 (two) times daily.   buPROPion (WELLBUTRIN XL) 300 MG 24 hr tablet Take 300 mg by mouth every morning.   calcium-vitamin D (OSCAL WITH D) 500-200 MG-UNIT tablet Take 1 tablet by mouth daily.   CINNAMON PO Take 2.5 mLs by mouth daily.   diclofenac sodium (VOLTAREN) 1 % GEL Apply 2 g topically daily as needed (pain).   Dulaglutide (TRULICITY) 1.60 VP/7.1GG SOPN Inject 0.75 mg into the skin once a week.   gemfibrozil (LOPID) 600 MG tablet Take 600 mg by mouth 2 (two) times daily before a meal.    Ginkgo Biloba  120 MG CAPS Take 120 mg by mouth daily.   GLUCOMANNAN PO Take 200 mg by mouth at bedtime.   HYDROcodone-acetaminophen (NORCO/VICODIN) 5-325 MG tablet Take 1-2 tablets by mouth every 4 (four) hours as needed for moderate pain (pain score 4-6).   latanoprost (XALATAN) 0.005 % ophthalmic solution Place 1 drop into both eyes at bedtime.   MAGNESIUM CITRATE PO Take 250 mg by mouth daily.   Melatonin 5 MG CAPS Take 5 mg by mouth at bedtime.   mirabegron ER (MYRBETRIQ) 25 MG TB24 tablet Take 25 mg by mouth daily.   omeprazole (PRILOSEC) 40 MG capsule Take 40 mg by mouth daily.    polyethylene glycol (MIRALAX / GLYCOLAX) packet Take 17 g by mouth daily.   pregabalin (LYRICA) 75 MG capsule Take 75 mg by mouth 2 (two) times daily.   Vitamin D, Ergocalciferol, (DRISDOL) 1.25 MG (50000 UNIT) CAPS capsule Take 50,000 Units by mouth once a week.    Allergies  Allergen Reactions   Cefdinir Hives and Other (See Comments)    Myalgia    Penicillins Rash and Other (See Comments)         SOCIAL  HISTORY/FAMILY HISTORY   Reviewed in Epic:   Social History   Tobacco Use   Smoking status: Former    Types: Cigarettes    Quit date: 01/02/1998    Years since quitting: 22.9   Smokeless tobacco: Never  Vaping Use   Vaping Use: Never used  Substance Use Topics   Alcohol use: Yes    Comment: occasional    Drug use: No   Social History   Social History Narrative   Not on file   No family history on file.  OBJCTIVE -PE, EKG, labs   Wt Readings from Last 3 Encounters:  11/29/20 160 lb 6.4 oz (72.8 kg)  11/18/20 162 lb 3.2 oz (73.6 kg)  11/16/20 162 lb 3.2 oz (73.6 kg)    Physical Exam: BP 124/62   Pulse 67   Ht 5\' 6"  (1.676 m)   Wt 160 lb 6.4 oz (72.8 kg)   SpO2 94%   BMI 25.89 kg/m  Physical Exam Vitals reviewed.  Constitutional:      General: She is not in acute distress.    Appearance: Normal appearance. She is normal weight. She is not toxic-appearing.  Cardiovascular:     Rate and Rhythm: Regular rhythm. FrequentExtrasystoles are present.    Pulses: Normal pulses and intact distal pulses.     Heart sounds: Normal heart sounds. No murmur heard. Pulmonary:     Effort: Pulmonary effort is normal.     Breath sounds: Normal breath sounds and air entry.  Musculoskeletal:        General: No swelling. Normal range of motion.     Right lower leg: No edema.     Left lower leg: No edema.  Skin:    General: Skin is warm.     Capillary Refill: Capillary refill takes less than 2 seconds.  Neurological:     Mental Status: She is alert.   Relatively unremarkable exam besides significant ectopy in at least bigeminy or trigeminy pattern.  No obvious M/R/G.  Difficult to assess due to significant ectopy. Lungs clear, normal bowel sounds. Right arm/shoulder immobilized following surgery, tender to palpation.   Adult ECG Report  Rate: 67 ;  Rhythm: normal sinus rhythm, premature ventricular contractions (PVC), and bigeminy  ;   Narrative Interpretation: Sinus rhythm  with premature ventricular contractions in bigeminy  EKG from PCPs office 08/13/2018: (scanned) Rate 58 bpm.  Rhythm: Sinus bradycardia with frequent PVCs and ventricular trigeminy.  Left atrial abnormality.  Lateral T wave inversions (noted with PVCs).  Consider lateral ischemia.  Recent Labs:    8/11/202 Na+ 138, K+ 3.9, Cl- 104, HCO3-26, BUN 13, Cr 0.93 (GFR 61), Glu 86, Ca2+ 9.3; AST 17, ALT 12, AlkP 52 CBC: W 8.3, H/H 12.2/37.6, Plt 325 TC 135, TG 218, HDL 44, LDLcalc 55 (TC and LDL down from 157 and 84 on 3/9/202); TSH 0.73; vitamin D 26.2 (low)   Lab Results  Component Value Date   CREATININE 0.98 11/19/2020   BUN 15 11/19/2020   NA 136 11/19/2020   K 4.4 11/19/2020   CL 106 11/19/2020   CO2 24 11/19/2020   CBC Latest Ref Rng & Units 11/20/2020 11/19/2020 11/16/2020  WBC 4.0 - 10.5 K/uL 9.9 11.9(H) 7.8  Hemoglobin 12.0 - 15.0 g/dL 10.9(L) 11.1(L) 13.1  Hematocrit 36.0 - 46.0 % 34.4(L) 34.3(L) 41.5  Platelets 150 - 400 K/uL 295 306 384    Lab Results  Component Value Date   HGBA1C 5.7 (H) 11/16/2020   No results found for: TSH  ==================================================  COVID-19 Education: The signs and symptoms of COVID-19 were discussed with the patient and how to seek care for testing (follow up with PCP or arrange E-visit).    I spent a total of 18 minutes with the patient spent in direct patient consultation.  Additional time spent with chart review  / charting (studies, outside notes, etc): 20 min Total Time: 38 min  Current medicines are reviewed at length with the patient today.  (+/- concerns)   This visit occurred during the SARS-CoV-2 public health emergency.  Safety protocols were in place, including screening questions prior to the visit, additional usage of staff PPE, and extensive cleaning of exam room while observing appropriate contact time as indicated for disinfecting solutions.  Notice: This dictation was prepared with Dragon dictation  along with smart phrase technology. Any transcriptional errors that result from this process are unintentional and may not be corrected upon review.   Studies Ordered:  Orders Placed This Encounter  Procedures   LONG TERM MONITOR (3-14 DAYS)   EKG 12-Lead   ECHOCARDIOGRAM COMPLETE    Patient Instructions / Medication Changes & Studies & Tests Ordered   Patient Instructions  Medication Instructions:  No changes  *If you need a refill on your cardiac medications before your next appointment, please call your pharmacy*   Lab Work: Not needed    Testing/Procedures: Will be schedule at  [facility] has requested that you have an echocardiogram. And  Will be mailed to you in 5 to 10 days by mail Your physician has recommended that you wear a holter monitor 7 days.    Follow-Up: At [facility], you and your health needs are our priority.  As part of our continuing mission to provide you with exceptional heart care, we have created designated Provider Care Teams.  These Care Teams include your primary Cardiologist (physician) and Advanced Practice Providers (APPs -  Physician Assistants and Nurse Practitioners) who all work together to provide you with the care you need, when you need it.     Your next appointment:   2 month(s)  The format for your next appointment:   In Person  Provider:   None    Other Instructions  ZIO XT- Long Term Monitor Instructions  Signed: Donney Dice, DO - R2 Fam Med  Attested:     Glenetta Hew, MD Glenetta Hew, M.D., M.S. Interventional Cardiologist   Pager # [phone] Phone # [phone] [address] [place], [place] [address]    Thank you for choosing Heartcare at [facility]!!

## 2020-11-29 NOTE — Assessment & Plan Note (Addendum)
Significant PVCs mostly unifocal, but some fusion beats also noted on EKG.  Mostly asymptomatic.  Check 2D echo and 7-day Zio patch monitor.  Pending results-potential wall motion abnormality versus high PVC burden, would consider ischemic evaluation.

## 2020-11-29 NOTE — Assessment & Plan Note (Signed)
Diagnosis was for a longer period she says she had an echocardiogram done years ago, but cannot find results.  Check 2D echo

## 2020-11-29 NOTE — Progress Notes (Signed)
    ATTENDING ATTESTATION  I have seen, examined and evaluated the patient along with the Resident Physician in clinic today.  I personally performed my own interview & exanimation.  After reviewing all the available data and chart, we discussed the patients laboratory, study & physical findings as well as symptoms in detail. I agree with her findings, examination as well as impression recommendations as per our discussion.    Attending adjustments int the full clinic noted annotated in [place].   Problem List Items Addressed This Visit       Cardiology Problems   Hyperlipidemia associated with type 2 diabetes mellitus (HCC) (Chronic)    Well-controlled on current dose statin.  No change. Most recent A1c 5.7.  Well-controlled.      Frequent unifocal PVCs - Primary (Chronic)    Significant PVCs mostly unifocal, but some fusion beats also noted on EKG.  Mostly asymptomatic.  Check 2D echo and 7-day Zio patch monitor.  Pending results-potential wall motion abnormality versus high PVC burden, would consider ischemic evaluation.      Relevant Orders   EKG 12-Lead   LONG TERM MONITOR (3-14 DAYS)   ECHOCARDIOGRAM COMPLETE     Other   Bradycardia (Chronic)    Physiologically she is bradycardic despite heart rate being in the 60s says she is having bigeminy.  Follow-up Zio patch monitor.  At present, no indication for PPM.      Relevant Orders   EKG 12-Lead   LONG TERM MONITOR (3-14 DAYS)   ECHOCARDIOGRAM COMPLETE   H/O mitral valve prolapse (Chronic)    Diagnosis was for a longer period she says she had an echocardiogram done years ago, but cannot find results.  Check 2D echo      Relevant Orders   EKG 12-Lead   LONG TERM MONITOR (3-14 DAYS)   ECHOCARDIOGRAM COMPLETE     Glenetta Hew, M.D., M.S. Interventional Cardiologist   Pager # [phone] Phone # [phone] [address] [place], [place] [address]

## 2020-11-29 NOTE — Patient Instructions (Addendum)
Medication Instructions:  No changes  *If you need a refill on your cardiac medications before your next appointment, please call your pharmacy*   Lab Work: Not needed    Testing/Procedures: Will be schedule at  [facility] has requested that you have an echocardiogram. Echocardiography is a painless test that uses sound waves to create images of your heart. It provides your doctor with information about the size and shape of your heart and how well your heart's chambers and valves are working. This procedure takes approximately one hour. There are no restrictions for this procedure.  And  Will be mailed to you in 5 to 10 days by mail Your physician has recommended that you wear a holter monitor 7 days. Holter monitors are medical devices that record the heart's electrical activity. Doctors most often use these monitors to diagnose arrhythmias. Arrhythmias are problems with the speed or rhythm of the heartbeat. The monitor is a small, portable device. You can wear one while you do your normal daily activities. This is usually used to diagnose what is causing palpitations/syncope (passing out).    Follow-Up: At [facility], you and your health needs are our priority.  As part of our continuing mission to provide you with exceptional heart care, we have created designated Provider Care Teams.  These Care Teams include your primary Cardiologist (physician) and Advanced Practice Providers (APPs -  Physician Assistants and Nurse Practitioners) who all work together to provide you with the care you need, when you need it.     Your next appointment:   2 month(s)  The format for your next appointment:   In Person  Provider:   None    Other Instructions  ZIO XT- Long Term Monitor Instructions  Your physician has requested you wear a ZIO patch monitor for 7 days.  This is a single patch monitor. [employer] supplies one patch monitor per enrollment.  Additional stickers are not available. Please do not apply patch if you will be having a Nuclear Stress Test,  Echocardiogram, Cardiac CT, MRI, or Chest Xray during the period you would be wearing the  monitor. The patch cannot be worn during these tests. You cannot remove and re-apply the  ZIO XT patch monitor.  Your ZIO patch monitor will be mailed 3 day USPS to your address on file. It may take 3-5 days  to receive your monitor after you have been enrolled.  Once you have received your monitor, please review the enclosed instructions. Your monitor  has already been registered assigning a specific monitor serial # to you.  Billing and Patient Assistance Program Information  We have supplied [employer] with any of your insurance information on file for billing purposes. [employer] offers a sliding scale Patient Assistance Program for patients that do not have  insurance, or whose insurance does not completely cover the cost of the ZIO monitor.  You must apply for the Patient Assistance Program to qualify for this discounted rate.  To apply, please call [employer] at [phone], select option 4, select option 2, ask to apply for  Patient Assistance Program. Theodore Demark will ask your household income, and how many people  are in your household. They will quote your out-of-pocket cost based on that information.  [employer] will also be able to set up a 35-month, interest-free payment plan if needed.  Applying the monitor   Shave hair from upper left chest.  Hold abrader disc by orange tab. Rub abrader in  40 strokes over the upper left chest as  indicated in your monitor instructions.  Clean area with 4 enclosed alcohol pads. Let dry.  Apply patch as indicated in monitor instructions. Patch will be placed under collarbone on left  side of chest with arrow pointing upward.  Rub patch adhesive wings for 2 minutes. Remove white label marked "1". Remove the white  label marked "2". Rub patch adhesive wings  for 2 additional minutes.  While looking in a mirror, press and release button in center of patch. A small green light will  flash 3-4 times. This will be your only indicator that the monitor has been turned on.  Do not shower for the first 24 hours. You may shower after the first 24 hours.  Press the button if you feel a symptom. You will hear a small click. Record Date, Time and  Symptom in the Patient Logbook.  When you are ready to remove the patch, follow instructions on the last 2 pages of Patient  Logbook. Stick patch monitor onto the last page of Patient Logbook.  Place Patient Logbook in the blue and white box. Use locking tab on box and tape box closed  securely. The blue and white box has prepaid postage on it. Please place it in the mailbox as  soon as possible. Your physician should have your test results approximately 7 days after the  monitor has been mailed back to [facility].  Call [place] at [phone] if you have questions regarding  your ZIO XT patch monitor. Call them immediately if you see an orange light blinking on your  monitor.  If your monitor falls off in less than 4 days, contact our Monitor department at [phone].  If your monitor becomes loose or falls off after 4 days call [employer] at [phone] for  suggestions on securing your monitor

## 2020-11-30 ENCOUNTER — Ambulatory Visit (INDEPENDENT_AMBULATORY_CARE_PROVIDER_SITE_OTHER): Payer: Medicare Other

## 2020-11-30 DIAGNOSIS — R001 Bradycardia, unspecified: Secondary | ICD-10-CM

## 2020-11-30 DIAGNOSIS — Z8679 Personal history of other diseases of the circulatory system: Secondary | ICD-10-CM

## 2020-11-30 DIAGNOSIS — I493 Ventricular premature depolarization: Secondary | ICD-10-CM

## 2020-11-30 NOTE — Progress Notes (Unsigned)
Enrolled patient for a 7 day Zio XT to be mailed to patients home

## 2020-12-02 HISTORY — PX: OTHER SURGICAL HISTORY: SHX169

## 2020-12-02 HISTORY — PX: TRANSTHORACIC ECHOCARDIOGRAM: SHX275

## 2020-12-03 ENCOUNTER — Encounter: Payer: Self-pay | Admitting: Orthopedic Surgery

## 2020-12-03 ENCOUNTER — Ambulatory Visit (INDEPENDENT_AMBULATORY_CARE_PROVIDER_SITE_OTHER): Payer: Medicare Other

## 2020-12-03 ENCOUNTER — Ambulatory Visit (INDEPENDENT_AMBULATORY_CARE_PROVIDER_SITE_OTHER): Payer: Medicare Other | Admitting: Orthopedic Surgery

## 2020-12-03 ENCOUNTER — Other Ambulatory Visit: Payer: Self-pay

## 2020-12-03 DIAGNOSIS — I493 Ventricular premature depolarization: Secondary | ICD-10-CM

## 2020-12-03 DIAGNOSIS — R001 Bradycardia, unspecified: Secondary | ICD-10-CM | POA: Diagnosis not present

## 2020-12-03 DIAGNOSIS — Z96611 Presence of right artificial shoulder joint: Secondary | ICD-10-CM

## 2020-12-03 DIAGNOSIS — Z8679 Personal history of other diseases of the circulatory system: Secondary | ICD-10-CM

## 2020-12-03 NOTE — Progress Notes (Signed)
   Post-Op Visit Note   Patient: Rose Smith           Date of Birth: September 02, 1940           MRN: [id number] Visit Date: 12/03/2020 PCP: Kelton Pillar, MD   Assessment & Plan:  Chief Complaint:  Chief Complaint  Patient presents with   Right Shoulder - Routine Post Op    11/18/20 Right RSA   Visit Diagnoses:  1. History of arthroplasty of right shoulder     Plan: Rose Smith is an 80 year old patient with right RSA performed 11/18/2020.  She is at 90 degrees on her CPM machine.  She is in a sling today but has not been wearing it much.  Taking Tylenol as needed for pain.  On exam the incision is intact.  Deltoid fires.  Range of motion is about 25/75/90.  Plan is [facility] physical therapy and return to clinic in 4 weeks  Follow-Up Instructions: No follow-ups on file.   Orders:  Orders Placed This Encounter  Procedures   XR Shoulder Right   No orders of the defined types were placed in this encounter.   Imaging: No results found.  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  Past Surgical History:  Procedure Laterality Date   ABDOMINAL HYSTERECTOMY     CARPAL TUNNEL RELEASE     left    CHOLECYSTECTOMY N/A 03/14/2017   Procedure: LAPAROSCOPIC CHOLECYSTECTOMY;  Surgeon: Coralie Keens, MD;   Location: [facility];  Service: General;  Laterality: N/A;   EXCISION/RELEASE BURSA HIP  07/26/2011   Procedure: EXCISION/RELEASE BURSA HIP;  Surgeon: Gearlean Alf, MD;  Location: [facility];  Service: Orthopedics;  Laterality: Left;  Left Hip Bursectomy with Gluteal Tendon Repair   EYE SURGERY     bilateral cataracts   REVERSE SHOULDER ARTHROPLASTY Right 11/18/2020   Procedure: RIGHT REVERSE SHOULDER ARTHROPLASTY;  Surgeon: Meredith Pel, MD;  Location: [facility];  Service: Orthopedics;  Laterality: Right;   ROTATOR CUFF REPAIR     bilateral    TONSILLECTOMY     vaginal wall repair      Social History   Occupational History   Not on file  Tobacco Use   Smoking status: Former    Types: Cigarettes    Quit date: 01/02/1998    Years since quitting: 22.9   Smokeless tobacco: Never  Vaping Use   Vaping Use: Never used  Substance and Sexual Activity   Alcohol use: Yes    Comment: occasional    Drug use: No   Sexual activity: Not on file

## 2020-12-08 DIAGNOSIS — M25511 Pain in right shoulder: Secondary | ICD-10-CM | POA: Diagnosis not present

## 2020-12-09 ENCOUNTER — Telehealth: Payer: Self-pay | Admitting: Orthopedic Surgery

## 2020-12-09 NOTE — Telephone Encounter (Signed)
Patient called. Says she is trying to return the equipment she has that was sent home with her after surgery. Would like someone to call her back. [phone]

## 2020-12-09 NOTE — Telephone Encounter (Signed)
IC discussed with patient.

## 2020-12-10 DIAGNOSIS — M25511 Pain in right shoulder: Secondary | ICD-10-CM | POA: Diagnosis not present

## 2020-12-13 DIAGNOSIS — M25511 Pain in right shoulder: Secondary | ICD-10-CM | POA: Diagnosis not present

## 2020-12-15 DIAGNOSIS — R001 Bradycardia, unspecified: Secondary | ICD-10-CM | POA: Diagnosis not present

## 2020-12-15 DIAGNOSIS — M25511 Pain in right shoulder: Secondary | ICD-10-CM | POA: Diagnosis not present

## 2020-12-15 DIAGNOSIS — I493 Ventricular premature depolarization: Secondary | ICD-10-CM | POA: Diagnosis not present

## 2020-12-15 DIAGNOSIS — Z8679 Personal history of other diseases of the circulatory system: Secondary | ICD-10-CM | POA: Diagnosis not present

## 2020-12-20 ENCOUNTER — Ambulatory Visit (HOSPITAL_COMMUNITY): Payer: Medicare Other | Attending: Cardiology

## 2020-12-20 ENCOUNTER — Other Ambulatory Visit: Payer: Self-pay

## 2020-12-20 DIAGNOSIS — Z8679 Personal history of other diseases of the circulatory system: Secondary | ICD-10-CM | POA: Insufficient documentation

## 2020-12-20 DIAGNOSIS — I493 Ventricular premature depolarization: Secondary | ICD-10-CM | POA: Diagnosis not present

## 2020-12-20 DIAGNOSIS — R001 Bradycardia, unspecified: Secondary | ICD-10-CM | POA: Diagnosis not present

## 2020-12-20 DIAGNOSIS — M25511 Pain in right shoulder: Secondary | ICD-10-CM | POA: Diagnosis not present

## 2020-12-20 LAB — ECHOCARDIOGRAM COMPLETE
Area-P 1/2: 2.78 cm2
MV M vel: 5.14 m/s
MV Peak grad: 105.7 mmHg
S' Lateral: 3.1 cm

## 2020-12-28 DIAGNOSIS — H401131 Primary open-angle glaucoma, bilateral, mild stage: Secondary | ICD-10-CM | POA: Diagnosis not present

## 2020-12-29 DIAGNOSIS — M25562 Pain in left knee: Secondary | ICD-10-CM | POA: Diagnosis not present

## 2020-12-30 DIAGNOSIS — M25511 Pain in right shoulder: Secondary | ICD-10-CM | POA: Diagnosis not present

## 2021-01-05 ENCOUNTER — Encounter: Payer: Medicare Other | Admitting: Orthopedic Surgery

## 2021-01-10 ENCOUNTER — Encounter: Payer: Medicare Other | Admitting: Orthopedic Surgery

## 2021-01-11 ENCOUNTER — Other Ambulatory Visit: Payer: Self-pay

## 2021-01-11 ENCOUNTER — Encounter: Payer: Self-pay | Admitting: Surgical

## 2021-01-11 ENCOUNTER — Ambulatory Visit (INDEPENDENT_AMBULATORY_CARE_PROVIDER_SITE_OTHER): Payer: Medicare Other | Admitting: Surgical

## 2021-01-11 DIAGNOSIS — Z96611 Presence of right artificial shoulder joint: Secondary | ICD-10-CM

## 2021-01-11 NOTE — Progress Notes (Signed)
Post-Op Visit Note   Patient: Rose Smith           Date of Birth: [birth year]           MRN: [id number] Visit Date: 01/11/2021 PCP: Kelton Pillar, MD   Assessment & Plan:  Chief Complaint:  Chief Complaint  Patient presents with   Right Shoulder - Routine Post Op    11/18/20 Right RSA   Visit Diagnoses:  1. History of arthroplasty of right shoulder     Plan: Patient is an 81 year old female who presents s/p right reverse shoulder arthroplasty on 11/18/2020.  She is doing well overall and feels she is progressively improving in regards to pain control and range of motion.  Pain is improving to the point that she only has to take occasional Tylenol arthritis when she goes to bed but otherwise just uses Voltaren gel for pain control.  Most of the soreness and pain she experiences is in the shoulder blade and has been worse since she tripped on a friend's rocking chair on Saturday and caught herself with the nonoperative arm on her friend's chair.  No instability events.  No change in the appearance of the incision or any signs/symptoms concerning for infection.  On exam, incision is well-healed.  Axillary nerve is intact with deltoid firing.  Excellent subscapularis strength.  No deformity to the shoulder noted.  Passive range of motion to 20 degrees external rotation, 80 degrees abduction, 125 degrees forward flexion.  Active range of motion pleasant to passive range of motion.  Plan is to continue with physical therapy agreed for physical therapy for the next 6 weeks.  Focus on range of motion to continue to improve and okay to start strengthening exercises at this point.  She will follow-up with Dr. Marlou Sa in 6 weeks for final check and likely release at that point.  Follow-Up Instructions: No follow-ups on file.   Orders:  No orders of the defined types were placed in this encounter.  No orders of the defined types were placed in this encounter.   Imaging: No results  found.  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  Past Surgical History:  Procedure Laterality Date   ABDOMINAL HYSTERECTOMY     CARPAL TUNNEL RELEASE     left    CHOLECYSTECTOMY N/A 03/14/2017   Procedure: LAPAROSCOPIC CHOLECYSTECTOMY;  Surgeon: Coralie Keens, MD;  Location: [facility];  Service: General;  Laterality: N/A;   EXCISION/RELEASE BURSA HIP  07/26/2011   Procedure: EXCISION/RELEASE BURSA HIP;  Surgeon: Gearlean Alf, MD;  Location: [facility];  Service: Orthopedics;  Laterality: Left;  Left Hip Bursectomy with Gluteal Tendon Repair   EYE SURGERY     bilateral cataracts  REVERSE SHOULDER ARTHROPLASTY Right 11/18/2020   Procedure: RIGHT REVERSE SHOULDER ARTHROPLASTY;  Surgeon: Meredith Pel, MD;  Location: [facility];  Service: Orthopedics;  Laterality: Right;   ROTATOR CUFF REPAIR     bilateral    TONSILLECTOMY     vaginal wall repair      Social History   Occupational History   Not on file  Tobacco Use   Smoking status: Former    Types: Cigarettes    Quit date: 01/02/1998    Years since quitting: 23.0   Smokeless tobacco: Never  Vaping Use   Vaping Use: Never used  Substance and Sexual Activity   Alcohol use: Yes    Comment: occasional    Drug use: No   Sexual activity: Not on file

## 2021-01-12 DIAGNOSIS — M25511 Pain in right shoulder: Secondary | ICD-10-CM | POA: Diagnosis not present

## 2021-01-13 DIAGNOSIS — Z1231 Encounter for screening mammogram for malignant neoplasm of breast: Secondary | ICD-10-CM | POA: Diagnosis not present

## 2021-01-25 ENCOUNTER — Ambulatory Visit: Payer: Medicare Other | Admitting: Cardiology

## 2021-01-25 ENCOUNTER — Encounter: Payer: Self-pay | Admitting: Cardiology

## 2021-01-25 ENCOUNTER — Other Ambulatory Visit: Payer: Self-pay

## 2021-01-25 DIAGNOSIS — I493 Ventricular premature depolarization: Secondary | ICD-10-CM

## 2021-01-25 DIAGNOSIS — R001 Bradycardia, unspecified: Secondary | ICD-10-CM | POA: Diagnosis not present

## 2021-01-25 DIAGNOSIS — Z8679 Personal history of other diseases of the circulatory system: Secondary | ICD-10-CM | POA: Diagnosis not present

## 2021-01-25 DIAGNOSIS — I4729 Other ventricular tachycardia: Secondary | ICD-10-CM | POA: Insufficient documentation

## 2021-01-25 DIAGNOSIS — I472 Ventricular tachycardia, unspecified: Secondary | ICD-10-CM

## 2021-01-25 DIAGNOSIS — R072 Precordial pain: Secondary | ICD-10-CM | POA: Diagnosis not present

## 2021-01-25 NOTE — Progress Notes (Signed)
Primary Care Provider: Kelton Pillar, MD Cardiologist: Glenetta Hew, MD Electrophysiologist: None  Clinic Note: Chief Complaint  Patient presents with   Follow-up    Echo and monitor results   Palpitations    PVCs   ===================================  ASSESSMENT/PLAN   Problem List Items Addressed This Visit       Cardiology Problems   Paroxysmal ventricular tachycardia    Very difficult to call 4-5 beat runs of PVCs ventricular tachycardia, but they are officially VT.  Very frequent spells is somewhat more concerning than just the PVCs long.  The overall burden of PVCs is very worrisome with VT runs being even more so.  Definitely need to exclude ischemia.  She does not really have classic anginal symptoms and was able to ride a stationary bicycle today without symptoms, some hoping for abnormal ischemic evaluation.  Plan: Coronary CTA for ischemic evaluation.      Relevant Orders   EKG 00-FVCB   Basic metabolic panel   Frequent unifocal PVCs (Chronic)    Close to 20% PVC burden with close to 6% PVC couplets.  Also frequent bursts of 4-5 beats PVCs.  Somewhat asymptomatic, but very worrisome based on this predominant PVC burden.  Echocardiogram is normal.  But based on this very high burden, will need ischemic evaluation.  I am fearful of wall motion artifact skewing Myoview findings.  We will prefer Coronary CTA.  Pending results of Coronary CTA, if no ischemia, would refer to EP for assistance in management.  Very difficult to treat PVCs with her borderline low blood pressure (likely dehydrated today) and baseline average heart rate in the 60s.  Question benefit of PVC ablation in this elderly woman.        Other   Precordial pain    An unusual fullness sensation that occurs when she is having PVCs.  Not necessarily associate with exertion, but very worrisome given PVC burden.  Plan: Ischemic evaluation with Coronary CTA and possible FFRCT.       Bradycardia (Chronic)    Monitor showed average heart rate of 67 bpm.  I suspect that the bradycardia noted was more the underlying rhythm without PVCs.  However the fact that her heart rate average is less than 70, would be reluctant to simply placed on AV nodal agents to block PVCs.  Pending ischemic evaluation, if negative, anticipate referral to EP.      H/O mitral valve prolapse (Chronic)    Mild MVP noted on echo, but not likely associated with PVCs.       ===================================  HPI:    Rose Smith is a 81 y.o. 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 Allison Quarry) she was seen on initial consultation on November 29, 2020 by myself along with Dr. Larae Grooms (R2) for evaluation of irregular heartbeat and bradycardia at the request of Dr. Kelton Pillar. She had noted feeling palpitations for about the last 5 years.  Had also noted some slow heart rates on her apple watch that she bought about 5 years ago.  EKG by Dr. Laurann Montana indicated PVCs.  She denied any chest pain pressure or dyspnea.  Notes that the palpitations are worse with lying down, and even worse with lying  on her left side.  Denied any syncope or near syncope. 2D echocardiogram and 7-day Zio patch ordered.  Recent Hospitalizations: None  Reviewed  CV studies:    The following studies were reviewed today: (if available, images/films reviewed: From Epic Chart or Care Everywhere) TTE 12/20/2020: EF 60 to 65%.  No R WMA.  Moderate LVH worse in the basal septal region.  GR 1 DD.  Mild to moderate LA dilation.  Normal RV size and function.  Normal RAP.  Mild MVP (most notable posterior).  Mild MR.  Aortic valve sclerosis with no  stenosis. Zio Patch Monitor   Predominant rhythm was sinus with a minimum heart rate of 50 bpm and maximal heart rate of 97 bpm; average heart rate 67 bpm. Rare PACs noted with occasional couplets and triplets. Very frequent isolated PVCs (19.9%) as well as frequent PVC couplets (6%). Rare triplets noted. Ventricular bigeminy and trigeminy both noted with the longest spell of trigeminy being 14 minutes. In addition to isolated PVCs and couplets, 369 "ventricular tachycardia" (VT) spells of 4-5 beats (fastest heart rate 182 bpm, 4 beats; longest episode 5 beats-110 bpm) Majority of the "VT" episodes were not not noted on patient trigger/log Symptoms noted with isolated PVCs, couplets, triplets and bigeminy/trigeminy. Described as skipped irregular beats.  Interval History:   Rose Smith returns today to discuss results of her studies.  She still feels the irregular heartbeats most notably when she is laying down, or relaxing at night.  Not prolonged, just short little bursts here and there.  She was surprised to hear of how many PVCs they were on the monitor.  She did not really push the button for the quadruplets and quintuple as they were noted, more just for the bigeminy trigeminy and couplets.  All the short little burst seem to be relatively asymptomatic to her.  I think she just was overwhelmed with family symptoms she did feel.  She still denies any sensation of chest pain or pressure with rest or exertion.  She does however feel an unusual fullness sensation in her chest when she is feeling these palpitations which is a little bit concerning.  Interestingly, that does not happen as much with activity as it does at rest.  No resting or exertional dyspnea.  No PND, orthopnea or edema.  No syncope or near syncope no TIA or amaurosis fugax.  REVIEWED OF SYSTEMS   Review of Systems  Constitutional:  Negative for malaise/fatigue (Only if she does not get much sleep) and weight loss.  HENT:   Positive for congestion (With allergies).   Respiratory: Negative.    Cardiovascular:        Per HPI  Gastrointestinal:  Negative for blood in stool, constipation and melena.  Musculoskeletal:  Positive for joint pain (Right shoulder still stiff and somewhat sore from her surgery).  Neurological:  Negative for dizziness and focal weakness.  Psychiatric/Behavioral: Negative.    All other systems reviewed and are negative.  I have reviewed and (if needed) personally updated the patient's problem list, medications, allergies, past medical and surgical history, social and family history.   PAST MEDICAL HISTORY   Past Medical History:  Diagnosis Date   Anemia    Anxiety    mild   Arthritis    lower back   Diabetes mellitus type 2, controlled, without complications (HCC)    GERD (gastroesophageal reflux disease)    Hyperlipidemia    Mitral valve prolapse    childhood murmur, per patient  Pneumonia    with COVID in 2021    PAST SURGICAL HISTORY   Past Surgical History:  Procedure Laterality Date   ABDOMINAL HYSTERECTOMY     CARPAL TUNNEL RELEASE     left    CHOLECYSTECTOMY N/A 03/14/2017   Procedure: LAPAROSCOPIC CHOLECYSTECTOMY;  Surgeon: Coralie Keens, MD;  [facility];  Service: General;  Laterality: N/A;   EXCISION/RELEASE BURSA HIP  07/26/2011   Procedure: EXCISION/RELEASE BURSA HIP;  Surgeon: Gearlean Alf, MD;  [facility];  Service: Orthopedics;  Laterality: Left;  Left Hip Bursectomy with Gluteal Tendon Repair   EYE SURGERY     bilateral cataracts   REVERSE SHOULDER ARTHROPLASTY Right 11/18/2020   Procedure: RIGHT REVERSE SHOULDER ARTHROPLASTY;  Surgeon: Meredith Pel, MD;  Location: [facility];  Service: Orthopedics;  Laterality: Right;   ROTATOR CUFF REPAIR     bilateral    TONSILLECTOMY     TRANSTHORACIC ECHOCARDIOGRAM  12/2020   EF 60 to 65%.  No R WMA.  Moderate LVH worse in the basal septal region.  GR 1 DD.  Mild to moderate LA dilation.   Normal RV size and function.  Normal RAP.  Mild MVP (most notable posterior).  Mild MR.  Aortic valve sclerosis with no stenosis.   vaginal wall repair      ZIO PATCH MONITOR  12/2020   Mostly sinus rhythm (rate range 50-97 bpm, average 67).  Rare PACs.  Frequent (19.9%) PVCs, frequent (6%) PVC couplets with prolonged spells of ventricular bigeminy or trigeminy.  369 4-5 beat runs of PVCs (fastest 180 bpm, 4 beats, longest 5 beats 410 bpm).  Mostly noted symptoms were PVC singles couplets, triplets and PVC  bigeminy/trigeminy.  4-5 beat runs not as frequently noted on Sx log    Immunization History  Administered Date(s) Administered   Tdap 09/02/2015    MEDICATIONS/ALLERGIES   Current Meds  Medication Sig   acetaminophen (TYLENOL) 650 MG CR tablet Take 1,300 mg by mouth every 8 (eight) hours as needed for pain.   aspirin EC 81 MG EC tablet Take 1 tablet (81 mg total) by mouth daily. Swallow whole.   atorvastatin (LIPITOR) 20 MG tablet Take 20 mg by mouth every other day.   B Complex-C (B-COMPLEX WITH VITAMIN C) tablet Take 1 tablet by mouth 2 (two) times daily.   buPROPion (WELLBUTRIN XL) 300 MG 24 hr tablet Take 300 mg by mouth every morning.   calcium-vitamin D (OSCAL WITH D) 500-200 MG-UNIT tablet Take 1 tablet by mouth daily.   CINNAMON PO Take 2.5 mLs by mouth daily.   clotrimazole (LOTRIMIN) 1 % cream Apply one fingertip amount to the affected area daily.   diclofenac sodium (VOLTAREN) 1 % GEL Apply 2 g topically daily as needed (pain).   Dulaglutide (TRULICITY) 4.94 WH/6.7RF SOPN Inject 0.75 mg into the skin once a week.   gemfibrozil (LOPID) 600 MG tablet Take 600 mg by mouth 2 (two) times daily before a meal.    Ginkgo Biloba 120 MG CAPS Take 120 mg by mouth daily.   GLUCOMANNAN PO Take 200 mg by mouth at bedtime.   latanoprost (XALATAN) 0.005 % ophthalmic solution Place 1 drop into both eyes at bedtime.   MAGNESIUM CITRATE PO Take 250 mg by mouth daily.   Melatonin 5 MG CAPS  Take 5 mg by mouth at bedtime.   mirabegron ER (MYRBETRIQ) 25 MG TB24 tablet Take 25 mg by mouth daily.   omeprazole (PRILOSEC) 40 MG capsule  Take 40 mg by mouth daily.    polyethylene glycol (MIRALAX / GLYCOLAX) packet Take 17 g by mouth daily.   pregabalin (LYRICA) 75 MG capsule Take 75 mg by mouth 2 (two) times daily.   Vitamin D, Ergocalciferol, (DRISDOL) 1.25 MG (50000 UNIT) CAPS capsule Take 50,000 Units by mouth once a week.    Allergies  Allergen Reactions   Cefdinir Hives and Other (See Comments)    Myalgia    Penicillins Rash and Other (See Comments)         SOCIAL HISTORY/FAMILY HISTORY   Reviewed in Epic:  Pertinent findings:  Social History   Tobacco Use   Smoking status: Former    Types: Cigarettes    Quit date: 01/02/1998    Years since quitting: 23.0   Smokeless tobacco: Never  Vaping Use   Vaping Use: Never used  Substance Use Topics   Alcohol use: Yes    Comment: occasional    Drug use: No   Social History   Social History Narrative   Not on file    OBJCTIVE -PE, EKG, labs   Wt Readings from Last 3 Encounters:  01/25/21 162 lb 3.2 oz (73.6 kg)  11/29/20 160 lb 6.4 oz (72.8 kg)  11/18/20 162 lb 3.2 oz (73.6 kg)    Physical Exam: BP (!) 92/47    Pulse 74    Ht 5\' 6"  (1.676 m)    Wt 162 lb 3.2 oz (73.6 kg)    SpO2 98%    BMI 26.18 kg/m  Physical Exam Vitals reviewed.  Constitutional:      General: She is not in acute distress.    Appearance: Normal appearance. She is normal weight. She is not ill-appearing or toxic-appearing.     Comments: Healthy-appearing.  Well-groomed.  Well-nourished.  HENT:     Head: Normocephalic and atraumatic.  Neck:     Vascular: No carotid bruit or JVD.  Cardiovascular:     Rate and Rhythm: Normal rate and regular rhythm. FrequentExtrasystoles are present.    Chest Wall: PMI is not displaced.     Pulses: Normal pulses.     Heart sounds: S1 normal and S2 normal. No murmur heard.   No friction rub. No gallop.   Pulmonary:     Effort: Pulmonary effort is normal. No respiratory distress.     Breath sounds: Normal breath sounds. No wheezing, rhonchi or rales.  Chest:     Chest wall: No tenderness.  Musculoskeletal:     Cervical back: Normal range of motion and neck supple.  Neurological:     General: No focal deficit present.     Mental Status: She is alert and oriented to person, place, and time.  Psychiatric:        Mood and Affect: Mood normal.        Behavior: Behavior normal.        Thought Content: Thought content normal.        Judgment: Judgment normal.     Adult ECG Report  Rate: 74 ;  Rhythm: normal sinus rhythm, sinus arrhythmia, premature ventricular contractions (PVC), and PVCs in singles and couplets ; otherwise normal axis, intervals durations.  Narrative Interpretation: Stable  Recent Labs: Reviewed.  No new labs. No results found for: CHOL, HDL, LDLCALC, LDLDIRECT, TRIG, CHOLHDL Lab Results  Component Value Date   CREATININE 0.98 11/19/2020   BUN 15 11/19/2020   NA 136 11/19/2020   K 4.4 11/19/2020   CL 106 11/19/2020  CO2 24 11/19/2020   CBC Latest Ref Rng & Units 11/20/2020 11/19/2020 11/16/2020  WBC 4.0 - 10.5 K/uL 9.9 11.9(H) 7.8  Hemoglobin 12.0 - 15.0 g/dL 10.9(L) 11.1(L) 13.1  Hematocrit 36.0 - 46.0 % 34.4(L) 34.3(L) 41.5  Platelets 150 - 400 K/uL 295 306 384    Lab Results  Component Value Date   HGBA1C 5.7 (H) 11/16/2020   No results found for: TSH  ==================================================  COVID-19 Education: The signs and symptoms of COVID-19 were discussed with the patient and how to seek care for testing (follow up with PCP or arrange E-visit).    I spent a total of 35 minutes with the patient spent in direct patient consultation.  Additional time spent with chart review  / charting (studies, outside notes, etc): 18 min Total Time: 53 min  Current medicines are reviewed at length with the patient today.  (+/- concerns) N/A  This  visit occurred during the SARS-CoV-2 public health emergency.  Safety protocols were in place, including screening questions prior to the visit, additional usage of staff PPE, and extensive cleaning of exam room while observing appropriate contact time as indicated for disinfecting solutions.  Notice: This dictation was prepared with Dragon dictation along with smart phrase technology. Any transcriptional errors that result from this process are unintentional and may not be corrected upon review.  Studies Ordered:   Orders Placed This Encounter  Procedures   Basic metabolic panel   EKG 40-CXKG    Patient Instructions / Medication Changes & Studies & Tests Ordered   Patient Instructions  Medication Instructions:  No changes  see instruction below  *If you need a refill on your cardiac medications before your next appointment, please call your pharmacy*   Lab Work: bmp If you have labs (blood work) drawn today and your tests are completely normal, you will receive your results only by: [place] (if you have MyChart) OR A paper copy in the mail If you have any lab test that is abnormal or we need to change your treatment, we will call you to review the results.   Testing/Procedures: Will schedule at [facility] Rad[facility]  [place] has requested that you have cardiac CTA. Cardiac computed tomography (CT) is a painless test that uses an x-ray machine to take clear, detailed pictures of your heart. Please follow instruction sheet as given.     Follow-Up: At [facility], you and your health needs are our priority.  As part of our continuing mission to provide you with exceptional heart care, we have created designated Provider Care Teams.  These Care Teams include your primary Cardiologist (physician) and Advanced Practice Providers (APPs -  Physician Assistants and Nurse Practitioners) who all work together to provide you with the care you need, when  you need it.     Your next appointment:   6 month(s)  The format for your next appointment:   In Person  Provider:   Glenetta Hew, MD      Glenetta Hew, M.D., M.S. Interventional Cardiologist   Pager # [phone] Phone # [phone] [address] [place], [place] [address]   Thank you for choosing Heartcare at [facility]!!

## 2021-01-25 NOTE — Patient Instructions (Addendum)
Medication Instructions:  No changes  see instruction below  *If you need a refill on your cardiac medications before your next appointment, please call your pharmacy*   Lab Work: bmp If you have labs (blood work) drawn today and your tests are completely normal, you will receive your results only by: [place] (if you have MyChart) OR A paper copy in the mail If you have any lab test that is abnormal or we need to change your treatment, we will call you to review the results.   Testing/Procedures: Will schedule at [facility] Radiology  [place] has requested that you have cardiac CTA. Cardiac computed tomography (CT) is a painless test that uses an x-ray machine to take clear, detailed pictures of your heart. Please follow instruction sheet as given.     Follow-Up: At [facility], you and your health needs are our priority.  As part of our continuing mission to provide you with exceptional heart care, we have created designated Provider Care Teams.  These Care Teams include your primary Cardiologist (physician) and Advanced Practice Providers (APPs -  Physician Assistants and Nurse Practitioners) who all work together to provide you with the care you need, when you need it.     Your next appointment:   6 month(s)  The format for your next appointment:   In Person  Provider:   Glenetta Hew, MD    Other Instructions    Your cardiac CT will be scheduled at one of the below locations:   [facility] [address] [place], [place] [address] [phone]    Please arrive at the [facility] main entrance (entrance A) of [facility] 30 minutes prior to test start time. You can use the FREE valet parking offered at the main entrance (encouraged to control the heart rate for the test) Proceed to the [facility] Radiology Department (first floor) to check-in and test prep.  I  Please follow these instructions carefully  (unless otherwise directed):  Do labs  7 days  ( BMP )  prior to test   On the Night Before the Test: Be sure to Drink plenty of water. Do not consume any caffeinated/decaffeinated beverages or chocolate 12 hours prior to your test. Do not take any antihistamines 12 hours prior to your test.  On the Day of the Test: Drink plenty of water until 1 hour prior to the test. Do not eat any food 4 hours prior to the test. You may take your regular medications prior to the test.  HOLD Furosemide/Hydrochlorothiazide morning of the test. FEMALES- please wear underwire-free bra if available, avoid dresses & tight clothing Take  Ivabradine 10mg  PO  two hours prior to test.       After the Test: Drink plenty of water. After receiving IV contrast, you may experience a mild flushed feeling. This is normal. On occasion, you may experience a mild rash up to 24 hours after the test. This is not dangerous. If this occurs, you can take Benadryl 25 mg and increase your fluid intake. If you experience trouble breathing, this can be serious. If it is severe call 911 IMMEDIATELY. If it is mild, please call our office. If you take any of these medications: Glipizide/Metformin, Avandament, Glucavance, please do not take 48 hours after completing test unless otherwise instructed.  We will call to schedule your test 2-4 weeks out understanding that some insurance companies will need an authorization prior to the  service being performed.   For non-scheduling related questions, please contact the cardiac imaging nurse navigator should you have any questions/concerns: Marchia Bond, Cardiac Imaging Nurse Navigator Gordy Clement, Cardiac Imaging Nurse Navigator [facility] Direct Office Dial: [phone]   For scheduling needs, including cancellations and rescheduling, please call [place], [phone].

## 2021-01-26 ENCOUNTER — Encounter: Payer: Self-pay | Admitting: Cardiology

## 2021-01-26 ENCOUNTER — Other Ambulatory Visit (HOSPITAL_COMMUNITY): Payer: Self-pay

## 2021-01-26 DIAGNOSIS — R072 Precordial pain: Secondary | ICD-10-CM | POA: Insufficient documentation

## 2021-01-26 MED ORDER — IVABRADINE HCL 5 MG PO TABS
10.0000 mg | ORAL_TABLET | Freq: Once | ORAL | 0 refills | Status: AC
  Filled 2021-01-26 – 2021-02-08 (×2): qty 2, 1d supply, fill #0

## 2021-01-26 NOTE — Assessment & Plan Note (Signed)
Monitor showed average heart rate of 67 bpm.  I suspect that the bradycardia noted was more the underlying rhythm without PVCs.  However the fact that her heart rate average is less than 70, would be reluctant to simply placed on AV nodal agents to block PVCs.  Pending ischemic evaluation, if negative, anticipate referral to EP.

## 2021-01-26 NOTE — Assessment & Plan Note (Signed)
An unusual fullness sensation that occurs when she is having PVCs.  Not necessarily associate with exertion, but very worrisome given PVC burden.  Plan: Ischemic evaluation with Coronary CTA and possible FFRCT.

## 2021-01-26 NOTE — Addendum Note (Signed)
Addended by: Raiford Simmonds on: 01/26/2021 05:37 PM   Modules accepted: Orders

## 2021-01-26 NOTE — Assessment & Plan Note (Signed)
Close to 20% PVC burden with close to 6% PVC couplets.  Also frequent bursts of 4-5 beats PVCs.  Somewhat asymptomatic, but very worrisome based on this predominant PVC burden.  Echocardiogram is normal.  But based on this very high burden, will need ischemic evaluation.  I am fearful of wall motion artifact skewing Myoview findings.  We will prefer Coronary CTA.  Pending results of Coronary CTA, if no ischemia, would refer to EP for assistance in management.  Very difficult to treat PVCs with her borderline low blood pressure (likely dehydrated today) and baseline average heart rate in the 60s.  Question benefit of PVC ablation in this elderly woman.

## 2021-01-26 NOTE — Assessment & Plan Note (Signed)
Mild MVP noted on echo, but not likely associated with PVCs.

## 2021-01-26 NOTE — Assessment & Plan Note (Signed)
Very difficult to call 4-5 beat runs of PVCs ventricular tachycardia, but they are officially VT.  Very frequent spells is somewhat more concerning than just the PVCs long.  The overall burden of PVCs is very worrisome with VT runs being even more so.  Definitely need to exclude ischemia.  She does not really have classic anginal symptoms and was able to ride a stationary bicycle today without symptoms, some hoping for abnormal ischemic evaluation.  Plan: Coronary CTA for ischemic evaluation.

## 2021-01-27 ENCOUNTER — Telehealth: Payer: Self-pay | Admitting: *Deleted

## 2021-01-27 DIAGNOSIS — M7071 Other bursitis of hip, right hip: Secondary | ICD-10-CM | POA: Diagnosis not present

## 2021-01-27 NOTE — Telephone Encounter (Signed)
Rn called patient . Instruction given  to patient concerning  Ivabradine 10 mg for CCTA. Patient is aware to pick up medication and that she will need BMP prior to CCTA.

## 2021-02-03 ENCOUNTER — Other Ambulatory Visit (HOSPITAL_COMMUNITY): Payer: Self-pay

## 2021-02-08 ENCOUNTER — Telehealth (HOSPITAL_COMMUNITY): Payer: Self-pay | Admitting: Emergency Medicine

## 2021-02-08 ENCOUNTER — Other Ambulatory Visit (HOSPITAL_COMMUNITY): Payer: Self-pay

## 2021-02-08 ENCOUNTER — Telehealth: Payer: Self-pay | Admitting: Cardiology

## 2021-02-08 ENCOUNTER — Ambulatory Visit: Payer: Medicare Other | Admitting: Podiatry

## 2021-02-08 DIAGNOSIS — I493 Ventricular premature depolarization: Secondary | ICD-10-CM

## 2021-02-08 DIAGNOSIS — R9431 Abnormal electrocardiogram [ECG] [EKG]: Secondary | ICD-10-CM

## 2021-02-08 DIAGNOSIS — I4729 Other ventricular tachycardia: Secondary | ICD-10-CM | POA: Diagnosis not present

## 2021-02-08 DIAGNOSIS — R072 Precordial pain: Secondary | ICD-10-CM

## 2021-02-08 LAB — BASIC METABOLIC PANEL
BUN/Creatinine Ratio: 18 (ref 12–28)
BUN: 19 mg/dL (ref 8–27)
CO2: 22 mmol/L (ref 20–29)
Calcium: 9.6 mg/dL (ref 8.7–10.3)
Chloride: 105 mmol/L (ref 96–106)
Creatinine, Ser: 1.03 mg/dL — ABNORMAL HIGH (ref 0.57–1.00)
Glucose: 80 mg/dL (ref 70–99)
Potassium: 4.8 mmol/L (ref 3.5–5.2)
Sodium: 139 mmol/L (ref 134–144)
eGFR: 54 mL/min/{1.73_m2} — ABNORMAL LOW (ref >59)

## 2021-02-08 NOTE — Telephone Encounter (Signed)
Opened in error Rose Hew, MD

## 2021-02-08 NOTE — Telephone Encounter (Signed)
-----   Message from Lorenza Evangelist, RN sent at 02/07/2021  3:54 PM EST ----- Hey Dr. Ellyn Hack,  I dont think shes a great CCTA candidate with her PVC burden.  Thoughts? Clarise Cruz

## 2021-02-08 NOTE — Telephone Encounter (Signed)
Calling patient to discuss parameters needed for a good CCTA. I explained that a regular HR/rhythm is necessary for a quality scan and with her high PVC burden (20%) I dont think shes a good CCTA candidate. I also explained that I did not want to risk exposing her to contrast and radiation if I knew it would produce a nondiagnostic scan. She sounded disappointed and asked if there was another testing option for her. I told her this was something she would need to discuss with Dr. Ellyn Hack.   Marchia Bond RN Navigator [facility] [phone] Office  [phone] Cell

## 2021-02-08 NOTE — Telephone Encounter (Signed)
Discussed concerns about Coronary CTA given PVCs.  Recommended converting to Myoview stress test.  She did not think she would be able to walk on treadmill, therefore we would recommend Lexiscan Myoview.  Will addend clinic note to indicate converting from Coronary CTA to [facility].  Shared Decision Making/Informed Consent{  The risks [chest pain, shortness of breath, cardiac arrhythmias, dizziness, blood pressure fluctuations, myocardial infarction, stroke/transient ischemic attack, nausea, vomiting, allergic reaction, radiation exposure, metallic taste sensation and life-threatening complications (estimated to be 1 in 10,000)], benefits (risk stratification, diagnosing coronary artery disease, treatment guidance) and alternatives of a nuclear stress test were discussed in detail with Rose Smith and she agrees to proceed.   Consent order signed   Glenetta Hew, MD

## 2021-02-09 ENCOUNTER — Encounter (HOSPITAL_COMMUNITY): Payer: Self-pay | Admitting: Cardiology

## 2021-02-09 NOTE — Addendum Note (Signed)
Addended by: Raiford Simmonds on: 02/09/2021 11:32 AM   Modules accepted: Orders

## 2021-02-09 NOTE — Telephone Encounter (Signed)
Called left message on patient voicemail - order placed for [facility]  If patient has any question may call back

## 2021-02-10 ENCOUNTER — Ambulatory Visit (HOSPITAL_COMMUNITY): Payer: Medicare Other

## 2021-02-15 ENCOUNTER — Telehealth (HOSPITAL_COMMUNITY): Payer: Self-pay

## 2021-02-15 DIAGNOSIS — M8580 Other specified disorders of bone density and structure, unspecified site: Secondary | ICD-10-CM | POA: Diagnosis not present

## 2021-02-15 DIAGNOSIS — E1121 Type 2 diabetes mellitus with diabetic nephropathy: Secondary | ICD-10-CM | POA: Diagnosis not present

## 2021-02-15 DIAGNOSIS — E785 Hyperlipidemia, unspecified: Secondary | ICD-10-CM | POA: Diagnosis not present

## 2021-02-15 DIAGNOSIS — E559 Vitamin D deficiency, unspecified: Secondary | ICD-10-CM | POA: Diagnosis not present

## 2021-02-15 DIAGNOSIS — M797 Fibromyalgia: Secondary | ICD-10-CM | POA: Diagnosis not present

## 2021-02-15 NOTE — Telephone Encounter (Signed)
Spoke with the patient, detailed instructions given. She stated that she would be here for her test. Asked to call back with any questions. S.Heaven Meeker EMTP 

## 2021-02-16 ENCOUNTER — Telehealth: Payer: Self-pay | Admitting: Cardiology

## 2021-02-16 NOTE — Telephone Encounter (Signed)
New Message:    Patient wants to know if she is supposed to be taking Corlanor Tab? She said she was not aware that she was supposed to be taking it. If she is, patient says she can not afford this medicine.

## 2021-02-16 NOTE — Telephone Encounter (Signed)
Called patient, advised that the Corlanor was just used for a one time dose for the CT scan (which was cancelled), patient scheduled for stress test tomorrow.

## 2021-02-17 ENCOUNTER — Ambulatory Visit (HOSPITAL_COMMUNITY): Payer: Medicare Other | Attending: Cardiovascular Disease

## 2021-02-17 ENCOUNTER — Other Ambulatory Visit: Payer: Self-pay

## 2021-02-17 ENCOUNTER — Telehealth (HOSPITAL_COMMUNITY): Payer: Self-pay | Admitting: *Deleted

## 2021-02-17 DIAGNOSIS — I4729 Other ventricular tachycardia: Secondary | ICD-10-CM

## 2021-02-17 DIAGNOSIS — M1712 Unilateral primary osteoarthritis, left knee: Secondary | ICD-10-CM | POA: Diagnosis not present

## 2021-02-17 DIAGNOSIS — R072 Precordial pain: Secondary | ICD-10-CM

## 2021-02-17 DIAGNOSIS — I493 Ventricular premature depolarization: Secondary | ICD-10-CM | POA: Diagnosis not present

## 2021-02-17 DIAGNOSIS — R9431 Abnormal electrocardiogram [ECG] [EKG]: Secondary | ICD-10-CM

## 2021-02-17 LAB — MYOCARDIAL PERFUSION IMAGING
LV dias vol: 87 mL (ref 46–106)
LV sys vol: 47 mL
Nuc Stress EF: 46 %
Peak HR: 83 {beats}/min
Rest HR: 62 {beats}/min
Rest Nuclear Isotope Dose: 9.4 mCi
SDS: 3
SRS: 0
SSS: 3
Stress Nuclear Isotope Dose: 32.4 mCi
TID: 1.17

## 2021-02-17 MED ORDER — TECHNETIUM TC 99M TETROFOSMIN IV KIT
32.4000 | PACK | Freq: Once | INTRAVENOUS | Status: AC | PRN
  Administered 2021-02-17: 32.4 via INTRAVENOUS
  Filled 2021-02-17: qty 33

## 2021-02-17 MED ORDER — TECHNETIUM TC 99M TETROFOSMIN IV KIT
9.4000 | PACK | Freq: Once | INTRAVENOUS | Status: AC | PRN
  Administered 2021-02-17: 9.4 via INTRAVENOUS
  Filled 2021-02-17: qty 10

## 2021-02-17 MED ORDER — REGADENOSON 0.4 MG/5ML IV SOLN
0.4000 mg | Freq: Once | INTRAVENOUS | Status: AC
  Administered 2021-02-17: 0.4 mg via INTRAVENOUS

## 2021-02-18 ENCOUNTER — Ambulatory Visit (INDEPENDENT_AMBULATORY_CARE_PROVIDER_SITE_OTHER): Payer: Medicare Other | Admitting: Surgical

## 2021-02-18 ENCOUNTER — Ambulatory Visit: Payer: Medicare Other | Admitting: Podiatry

## 2021-02-18 DIAGNOSIS — Z96611 Presence of right artificial shoulder joint: Secondary | ICD-10-CM

## 2021-02-19 ENCOUNTER — Encounter: Payer: Self-pay | Admitting: Surgical

## 2021-02-19 NOTE — Progress Notes (Signed)
Post-Op Visit Note   Patient: Rose Smith           Date of Birth: [birth year]           MRN: [id number] Visit Date: 02/18/2021 PCP: Kelton Pillar, MD   Assessment & Plan:  Chief Complaint:  Chief Complaint  Patient presents with   Right Shoulder - Follow-up   Visit Diagnoses: No diagnosis found.  Plan: Patient is an 81 year old female who presents s/p right reverse shoulder arthroplasty on 11/18/2020.  She is doing well and feels great.  She denies any issues with the right shoulder.  She has finished physical therapy.  She feels her range of motion is excellent.  She never has to take any medications for postop pain and only has to take occasional Tylenol arthritis for her back pain.  On exam she has 30 degrees X rotation, 95 degrees abduction, 150 degrees forward flexion.  Incision is well-healed.  Excellent subscapularis strength rated 5/5.  2+ radial pulse of the operative extremity.  Axillary nerve is intact with deltoid firing.  Plan is to continue with home exercise program on an intermittent basis.  Follow-up with the office as needed.  Discussed dental antibiotic prophylaxis and patient understands and agrees with plan.  Follow-Up Instructions: No follow-ups on file.   Orders:  No orders of the defined types were placed in this encounter.  No orders of the defined types were placed in this encounter.   Imaging: No results found.  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  Past Surgical History:  Procedure Laterality Date   ABDOMINAL HYSTERECTOMY     CARPAL TUNNEL RELEASE     left    CHOLECYSTECTOMY N/A 03/14/2017   Procedure: LAPAROSCOPIC CHOLECYSTECTOMY;  Surgeon: Coralie Keens, MD;  Location: [facility];  Service: General;  Laterality: N/A;   EXCISION/RELEASE BURSA HIP  07/26/2011   Procedure: EXCISION/RELEASE BURSA HIP;  Surgeon: Gearlean Alf, MD;  Location: [facility];  Service: Orthopedics;  Laterality: Left;  Left Hip Bursectomy with Gluteal Tendon Repair   EYE SURGERY     bilateral cataracts   REVERSE SHOULDER ARTHROPLASTY Right 11/18/2020   Procedure: RIGHT REVERSE SHOULDER ARTHROPLASTY;  Surgeon: Meredith Pel, MD;  Location: [place];  Service: Orthopedics;  Laterality: Right;   ROTATOR CUFF REPAIR     bilateral    TONSILLECTOMY     TRANSTHORACIC ECHOCARDIOGRAM  12/2020   EF 60 to 65%.  No R WMA.  Moderate LVH worse in the basal septal region.  GR 1 DD.  Mild to moderate LA dilation.  Normal RV size and function.  Normal RAP.  Mild MVP (  most notable posterior).  Mild MR.  Aortic valve sclerosis with no stenosis.   vaginal wall repair      ZIO PATCH MONITOR  12/2020   Mostly sinus rhythm (rate range 50-97 bpm, average 67).  Rare PACs.  Frequent (19.9%) PVCs, frequent (6%) PVC couplets with prolonged spells of ventricular bigeminy or trigeminy.  369 4-5 beat runs of PVCs (fastest 180 bpm, 4 beats, longest 5 beats 410 bpm).  Mostly noted symptoms were PVC singles couplets, triplets and PVC  bigeminy/trigeminy.  4-5 beat runs not as frequently noted on Sx log   Social History   Occupational History   Not on file  Tobacco Use   Smoking status: Former    Types: Cigarettes    Quit  date: 01/02/1998    Years since quitting: 23.1   Smokeless tobacco: Never  Vaping Use   Vaping Use: Never used  Substance and Sexual Activity   Alcohol use: Yes    Comment: occasional    Drug use: No   Sexual activity: Not on file

## 2021-02-21 ENCOUNTER — Other Ambulatory Visit: Payer: Self-pay

## 2021-02-21 ENCOUNTER — Ambulatory Visit: Payer: Medicare Other | Admitting: Podiatry

## 2021-02-21 ENCOUNTER — Ambulatory Visit (INDEPENDENT_AMBULATORY_CARE_PROVIDER_SITE_OTHER): Payer: Medicare Other

## 2021-02-21 DIAGNOSIS — M2042 Other hammer toe(s) (acquired), left foot: Secondary | ICD-10-CM

## 2021-02-21 DIAGNOSIS — M2041 Other hammer toe(s) (acquired), right foot: Secondary | ICD-10-CM

## 2021-02-21 NOTE — Progress Notes (Signed)
° °  HPI: 81 y.o. female PMHx diabetes mellitus well controlled presenting today for evaluation of symptomatic hammertoes to the bilateral feet.  Patient states that she does have a history of hammertoe correction to the second third and fourth digits of the left foot as well as the second digit of the right foot.  She says that most recently she has developed increased pain and tenderness to the left fifth digit as well as the third and fourth digit of the right foot.  Is been ongoing for several months despite conservative treatment including shoe gear modifications.  She presents for further treatment and evaluation and to discuss possible surgery  Past Medical History:  Diagnosis Date   Anemia    Anxiety    mild   Arthritis    lower back   Diabetes mellitus type 2, controlled, without complications (HCC)    GERD (gastroesophageal reflux disease)    Hyperlipidemia    Mitral valve prolapse    childhood murmur, per patient   Pneumonia    with COVID in 2021      Objective: Physical Exam General: The patient is alert and oriented x3 in no acute distress.  Dermatology: Skin is cool, dry and supple bilateral lower extremities. Negative for open lesions or macerations.  Vascular: Palpable pedal pulses bilaterally. No edema or erythema noted. Capillary refill within normal limits.  Neurological: Epicritic and protective threshold grossly intact bilaterally.   Musculoskeletal Exam: All pedal and ankle joints range of motion within normal limits bilateral. Muscle strength 5/5 in all groups bilateral. Hammertoe contracture deformity noted to digits 5 of the left foot as well as the third and fourth digit of the right foot  Radiographic Exam: Hammertoe contracture deformity noted to the interphalangeal joints of the respective hammertoe digits mentioned on clinical musculoskeletal exam.  No fractures identified   Assessment: 1.  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  Fifth digit  left 2.  Diabetes mellitus well controlled   Plan of Care:  1. Patient evaluated. X-Rays reviewed.  2. Today we discussed the conservative versus surgical management of the presenting pathology. The patient opts for surgical management. All possible complications and details of the procedure were explained. All patient questions were answered. No guarantees were expressed or implied. 3. Authorization for surgery was initiated today. Surgery will consist of PIPJ arthroplasty with percutaneous pin fixation third and fourth digit right foot.  PIPJ arthroplasty with derotational skin plasty fifth digit left foot 4.  The patient states that most recently she has been diagnosed with some irregular heart rhythm.  We will require cardiac clearance prior to proceeding with surgery 5.  Return to clinic 1 week postop  *Yolanda Bonine has a wedding in [place], [place]   Edrick Kins, [facility]  Dr. Edrick Kins, DPM    [address], [place] [address]                Office [phone]  Fax [phone]

## 2021-02-22 ENCOUNTER — Telehealth: Payer: Self-pay

## 2021-02-22 ENCOUNTER — Encounter: Payer: Self-pay | Admitting: Podiatry

## 2021-02-22 NOTE — Telephone Encounter (Signed)
° °  Patient Name: Rose Smith  DOB: 09/03/1940 MRN: [id number]  Primary Cardiologist: Glenetta Hew, MD  Chart reviewed as part of pre-operative protocol coverage. 81 year old female with a history of precordial pain, palpitations, paroxysmal ventricular tachycardia, PVCs (close to 20% PVC burden), bradycardia, mitral valve prolapse (mild on most recent echo 12/2020), anemia, and type 2 diabetes.  Echocardiogram on 12/20/2020 showed EF 65%, no RWMA, G1 DD, mild to moderate LA dilation, normal RV size and function, mild MR aortic valve sclerosis without evidence of stenosis. Outpatient cardiac monitor from 05/2020 showed sinus rhythm, sinus bradycardia, very frequent isolated PVCs (19.9% burden), and PSVT.    She was last seen by Dr. Ellyn Hack on 01/24/2021 and reported fullness in chest, palpitations. EP referral was mentioned at the time given high PVC burden, pending ischemic evaluation. Unable to complete coronary CTA given high PVC burden. She underwent Lexiscan Myoview on 02/17/2021 that was low risk, no evidence of ischemia.  Has not been seen in follow-up since.  Request for surgical clearance for PIPJ arthroplasty with percutaneous pin fixation third and fourth digit right foot PIPJ arthroplasty with derotational skin plasty fifth digit left foot received with request to hold aspirin prior to surgery.   Given symptoms at last OV, Dr. Ellyn Hack please advise on whether or not patient needs follow-up appointment prior to surgical clearance, or if okay to proceed with surgical clearance at this time (if so, please provide recommendations for holding ASA prior to surgery).  Thank you.   Lenna Sciara, NP 02/22/2021, 3:48 PM

## 2021-02-22 NOTE — Telephone Encounter (Signed)
° °  Pre-operative Risk Assessment    Patient Name: Rose Smith  DOB: 1940-11-01 MRN: [id number]      Request for Surgical Clearance    Procedure:   PIPJ arthroplasty with percutaneous pin fixation third and fourth digit right foot PIPJ arthroplasty with derotational skin plasty fifth digit left foot   Date of Surgery:  Clearance 04/07/21                                 Surgeon:  Daylene Katayama, DPM Surgeon's Group or Practice Name:  [facility] Phone number:  [phone] Fax number:  [phone]   Type of Clearance Requested:   - Medical  - Pharmacy:  Hold Aspirin     Type of Anesthesia:  General    Additional requests/questions:    SignedJacqulynn Cadet   02/22/2021, 2:13 PM

## 2021-02-22 NOTE — Telephone Encounter (Signed)
With normal Echo & Non-ischemic Myoview -- should not be a problem to proceed with surgery.   Glenetta Hew, MD

## 2021-02-23 NOTE — Telephone Encounter (Signed)
Not sure how urgent the surgery is needed to be done.  If we can get her into see EP that would be fine.  I do not think that the PVCs make a difference in his shoulder surgery.  However I am perfectly fine having her see EP prior to going through surgery.  Glenetta Hew, MD

## 2021-02-23 NOTE — Telephone Encounter (Signed)
° °  Patient Name: Rose Smith  DOB: November 25, 1940 MRN: [id number]  Primary Cardiologist: Glenetta Hew, MD  Chart reviewed as part of pre-operative protocol coverage. Patient contacted on 02/23/2021 as part of preoperative screening process. As per review with Dr. Ellyn Hack, and given recent normal echocardiogram and nonischemic Myoview, patient at acceptable risk to proceed with surgery. However, upon discussing this with patient, patient states she is concerned over ongoing symptomatic PVCs. She states she discussed  the possibility of an EP referrawith Dr. Ellyn Hack at her last outpatient visit. She would like to discuss EP referral with Dr. Ellyn Hack prior to proceeding with surgery. I will route this request to Dr. Ellyn Hack for review and further recommendations.    Lenna Sciara, NP 02/23/2021, 3:27 PM

## 2021-02-24 DIAGNOSIS — M1712 Unilateral primary osteoarthritis, left knee: Secondary | ICD-10-CM | POA: Diagnosis not present

## 2021-03-03 DIAGNOSIS — M1712 Unilateral primary osteoarthritis, left knee: Secondary | ICD-10-CM | POA: Diagnosis not present

## 2021-03-04 ENCOUNTER — Telehealth: Payer: Self-pay

## 2021-03-04 NOTE — Telephone Encounter (Signed)
DOS 04/07/2021 ? ?HAMMERTOE REPAIR 3,4 RT, 5TH LT - [id number] ? ?[facility] MEDICRE EFFECTIVE DATE - 1/1/203 ? ?PLAN DEDUCTIBLE - $0.00 ?OUT OF POCKET - $3600.00 B/$9800.12 ? ?CO-INSURANCE ?0% / Day OUTPATIENT SURGERY ?0% / [place] ?COPAY ?$295 / Day OUTPATIENT SURGERY ?$74 / [place] ? ? ?Notification or Prior Authorization is not required for the requested services ? ?This [employer] Medicare Advantage members plan does not currently require a prior authorization for these services. If you have general questions about the prior authorization requirements, please call us at [phone] or visit [email or web address] > Clinician Resources > Advance and Admission Notification Requirements. The number above acknowledges your notification. Please write this number down for future reference. Notification is not a guarantee of coverage or payment. ? ?Decision ID #:[id number] ?

## 2021-03-18 ENCOUNTER — Ambulatory Visit (INDEPENDENT_AMBULATORY_CARE_PROVIDER_SITE_OTHER): Payer: Medicare Other

## 2021-03-18 ENCOUNTER — Ambulatory Visit: Payer: Medicare Other | Admitting: Cardiology

## 2021-03-18 ENCOUNTER — Encounter: Payer: Self-pay | Admitting: Cardiology

## 2021-03-18 ENCOUNTER — Other Ambulatory Visit: Payer: Self-pay

## 2021-03-18 VITALS — BP 106/60 | HR 70 | Ht 66.0 in | Wt 168.0 lb

## 2021-03-18 DIAGNOSIS — I493 Ventricular premature depolarization: Secondary | ICD-10-CM

## 2021-03-18 MED ORDER — FLECAINIDE ACETATE 50 MG PO TABS: 50.0000 mg | ORAL_TABLET | Freq: Two times a day (BID) | ORAL | 3 refills | Status: DC

## 2021-03-18 NOTE — Patient Instructions (Addendum)
Medication Instructions:  ?Your physician has recommended you make the following change in your medication: ?START Flecainide 50 mg twice daily ? ?*If you need a refill on your cardiac medications before your next appointment, please call your pharmacy* ? ? ?Lab Work: ?None ordered ? ? ?Testing/Procedures: ?Your physician has recommended that you wear a 3 day holter monitor - before you follow up with Dr. Curt Bears in several months. Holter monitors are medical devices that record the heart?s electrical activity. Doctors most often use these monitors to diagnose arrhythmias. Arrhythmias are problems with the speed or rhythm of the heartbeat. The monitor is a small, portable device. You can wear one while you do your normal daily activities. This is usually used to diagnose what is causing palpitations/syncope (passing out). ?                         ?ZIO XT- Long Term Monitor Instructions ? ?Your physician has requested you wear a ZIO patch monitor for 3 days.  ?This is a single patch monitor. [employer] supplies one patch monitor per enrollment. Additional ?stickers are not available. Please do not apply patch if you will be having a Nuclear Stress Test,  ?Echocardiogram, Cardiac CT, MRI, or Chest Xray during the period you would be wearing the  ?monitor. The patch cannot be worn during these tests. You cannot remove and re-apply the  ?ZIO XT patch monitor.  ?Your ZIO patch monitor will be mailed 3 day USPS to your address on file. It may take 3-5 days  ?to receive your monitor after you have been enrolled.  ?Once you have received your monitor, please review the enclosed instructions. Your monitor  ?has already been registered assigning a specific monitor serial # to you. ? ?Billing and Patient Assistance Program Information ? ?We have supplied [employer] with any of your insurance information on file for billing purposes. ?[employer] offers a sliding scale Patient Assistance Program for patients that do not have   ?insurance, or whose insurance does not completely cover the cost of the ZIO monitor.  ?You must apply for the Patient Assistance Program to qualify for this discounted rate.  ?To apply, please call [employer] at [phone], select option 4, select option 2, ask to apply for  ?Patient Assistance Program. Theodore Demark will ask your household income, and how many people  ?are in your household. They will quote your out-of-pocket cost based on that information.  ?[employer] will also be able to set up a 51-month interest-free payment plan if needed. ? ?Applying the monitor ?  ?Shave hair from upper left chest.  ?Hold abrader disc by orange tab. Rub abrader in 40 strokes over the upper left chest as  ?indicated in your monitor instructions.  ?Clean area with 4 enclosed alcohol pads. Let dry.  ?Apply patch as indicated in monitor instructions. Patch will be placed under collarbone on left  ?side of chest with arrow pointing upward.  ?Rub patch adhesive wings for 2 minutes. Remove white label marked "1". Remove the white  ?label marked "2". Rub patch adhesive wings for 2 additional minutes.  ?While looking in a mirror, press and release button in center of patch. A small green light will  ?flash 3-4 times. This will be your only indicator that the monitor has been turned on.  ?Do not shower for the first 24 hours. You may shower after the first 24 hours.  ?Press the button if you feel a symptom. You will hear a  small click. Record Date, Time and  ?Symptom in the Patient Logbook.  ?When you are ready to remove the patch, follow instructions on the last 2 pages of Patient  ?Logbook. Stick patch monitor onto the last page of Patient Logbook.  ?Place Patient Logbook in the blue and white box. Use locking tab on box and tape box closed  ?securely. The blue and white box has prepaid postage on it. Please place it in the mailbox as  ?soon as possible. Your physician should have your test results approximately 7 days after the  ?monitor  has been mailed back to [facility].  ?Call [facility] at [phone] if you have questions regarding  ?your ZIO XT patch monitor. Call them immediately if you see an orange light blinking on your  ?monitor.  ?If your monitor falls off in less than 4 days, contact our Monitor department at [phone].  ?If your monitor becomes loose or falls off after 4 days call [employer] at [phone] for  ?suggestions on securing your monitor ? ? ? ?Follow-Up: ?At [facility], you and your health needs are our priority.  As part of our continuing mission to provide you with exceptional heart care, we have created designated Provider Care Teams.  These Care Teams include your primary Cardiologist (physician) and Advanced Practice Providers (APPs -  Physician Assistants and Nurse Practitioners) who all work together to provide you with the care you need, when you need it. ? ? ?Your physician recommends that you schedule a follow-up appointment in: 7-10 days for nurse visit EKG (post Flecainide start)  ? ?Your next appointment:   ?3 month(s) ? ?The format for your next appointment:   ?In Person ? ?Provider:   ?Allegra Lai, MD ? ? ? ?Thank you for choosing [facility]!! ? ? ?Trinidad Curet, RN ?[phone] ? ?Other Instructions ? ? Flecainide Tablets ?What is this medication? ?FLECAINIDE (FLEK a nide) prevents and treats a fast or irregular heartbeat (arrhythmia). It is often used to treat a type of arrhythmia known as AFib (atrial fibrillation). It works by slowing down overactive electric signals in the heart, which stabilizes your heart rhythm. It belongs to a group of medications called antiarrhythmics. ?This medicine may be used for other purposes; ask your health care provider or pharmacist if you have questions. ?COMMON BRAND NAME(S): Tambocor ?What should I tell my care team before I take this medication? ?They need to know if you have any of these conditions: ?Abnormal levels of potassium  in the blood ?Heart disease including heart rhythm and heart rate problems ?Kidney or liver disease ?Recent heart attack ?An unusual or allergic reaction to flecainide, local anesthetics, other medications, foods, dyes, or preservatives ?Pregnant or trying to get pregnant ?Breast-feeding ?How should I use this medication? ?Take this medication by mouth with a glass of water. Follow the directions on the prescription label. You can take this medication with or without food. Take your doses at regular intervals. Do not take your medication more often than directed. Do not stop taking this medication suddenly. This may cause serious, heart-related side effects. If your care team wants you to stop the medication, the dose may be slowly lowered over time to avoid any side effects. ?Talk to your care team regarding the use of this medication in children. While this medication may be prescribed for children as young as 1 year of age for selected conditions, precautions do apply. ?Overdosage: If you think you have taken too much of  this medicine contact a poison control center or emergency room at once. ?NOTE: This medicine is only for you. Do not share this medicine with others. ?What if I miss a dose? ?If you miss a dose, take it as soon as you can. If it is almost time for your next dose, take only that dose. Do not take double or extra doses. ?What may interact with this medication? ?Do not take this medication with any of the following: ?Amoxapine ?Arsenic trioxide ?Certain antibiotics like clarithromycin, erythromycin, gatifloxacin, gemifloxacin, levofloxacin, moxifloxacin, sparfloxacin, or troleandomycin ?Certain antidepressants called tricyclic antidepressants like amitriptyline, imipramine, or nortriptyline ?Certain medications to control heart rhythm like disopyramide, encainide, moricizine, procainamide, propafenone, and  quinidine ?Cisapride ?Delavirdine ?Droperidol ?Haloperidol ?[place] ?Imatinib ?Levomethadyl ?Maprotiline ?Medications for malaria like chloroquine and halofantrine ?Pentamidine ?Phenothiazines like chlorpromazine, mesoridazine, prochlorperazine, thioridazine ?Pimozide ?Quinine ?Ranolazine ?Ritonavir

## 2021-03-18 NOTE — Progress Notes (Unsigned)
Enrolled patient for a 3 day Zio XT monitor to be mailed to patients home ? ?To be worn June 2023 ?

## 2021-03-18 NOTE — Progress Notes (Signed)
? ?Electrophysiology Office Note ? ? ?Date:  03/18/2021  ? ?ID:  Rose Smith, DOB Feb 19, 1940, MRN [id number] ? ?PCP:  Kelton Pillar, MD  ?Cardiologist:  Ellyn Hack ?Primary Electrophysiologist:  Zowie Lundahl Meredith Leeds, MD   ? ?Chief Complaint: PVC ?  ?History of Present Illness: ?Rose Smith is a 81 y.o. female who is being seen today for the evaluation of PVC at the request of Kelton Pillar, MD. Presenting today for electrophysiology evaluation. ? ?He has a history significant for hyperlipidemia, type 2 diabetes, mitral valve prolapse.  She wore a cardiac monitor that showed an elevated PVC burden.  She feels significant palpitations.  Seen most often feels these when she is laying down and relaxing at night.  None of these are prolonged, just intermittent bursts of symptoms.  She has no chest pain.  She does feel a fullness in her chest when she is having palpitations.  She also has symptoms of mild shortness of breath and fatigue. ? ?Today, she denies symptoms of chest pain, orthopnea, PND, lower extremity edema, claudication, dizziness, presyncope, syncope, bleeding, or neurologic sequela. The patient is tolerating medications without difficulties.  ? ? ?Past Medical History:  ?Diagnosis Date  ? Anemia   ? Anxiety   ? mild  ? 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 Diabetes mellitus type 2, controlled, without complications ([place])   ? GERD (gastroesophageal reflux disease)   ? Hyperlipidemia   ? Mitral valve prolapse   ? childhood murmur, per patient  ? Pneumonia   ? with COVID in 2021  ? ?Past Surgical History:  ?Procedure Laterality Date  ? ABDOMINAL HYSTERECTOMY    ? CARPAL TUNNEL RELEASE    ? left   ? CHOLECYSTECTOMY N/A 03/14/2017  ? Procedure: LAPAROSCOPIC CHOLECYSTECTOMY;  Surgeon: Coralie Keens, MD;  Location: [facility];  Service: General;  Laterality: N/A;  ? EXCISION/RELEASE BURSA HIP  07/26/2011  ? Procedure: EXCISION/RELEASE BURSA HIP;  Surgeon: Gearlean Alf, MD;  Location: [facility];  Service:  Orthopedics;  Laterality: Left;  Left Hip Bursectomy with Gluteal Tendon Repair  ? EYE SURGERY    ? bilateral cataracts  ? REVERSE SHOULDER ARTHROPLASTY Right 11/18/2020  ? Procedure: RIGHT REVERSE SHOULDER ARTHROPLASTY;  Surgeon: Meredith Pel, MD;  Location: [facility];  Service: Orthopedics;  Laterality: Right;  ? ROTATOR CUFF REPAIR    ? bilateral   ? TONSILLECTOMY    ? TRANSTHORACIC ECHOCARDIOGRAM  12/2020  ? EF 60 to 65%.  No R WMA.  Moderate LVH worse in the basal septal region.  GR 1 DD.  Mild to moderate LA dilation.  Normal RV size and function.  Normal RAP.  Mild MVP (most notable posterior).  Mild MR.  Aortic valve sclerosis with no stenosis.  ? vaginal wall repair     ? ZIO PATCH MONITOR  12/2020  ? Mostly sinus rhythm (rate range 50-97 bpm, average 67).  Rare PACs.  Frequent (19.9%) PVCs, frequent (6%) PVC couplets with prolonged spells of ventricular bigeminy or trigeminy.  369 4-5 beat runs of PVCs (fastest 180 bpm, 4 beats, longest 5 beats 410 bpm).  Mostly noted symptoms were PVC singles couplets, triplets and PVC  bigeminy/trigeminy.  4-5 beat runs not as frequently noted on Sx log  ? ? ? ?Current Outpatient Medications  ?Medication Sig Dispense Refill  ? acetaminophen (TYLENOL) 650 MG CR tablet Take 1,300 mg by mouth every 8 (eight) hours as needed for pain.    ? aspirin EC 81 MG  EC tablet Take 1 tablet (81 mg total) by mouth daily. Swallow whole. 30 tablet 11  ? atorvastatin (LIPITOR) 20 MG tablet Take 20 mg by mouth every other day.    ? B Complex-C (B-COMPLEX WITH VITAMIN C) tablet Take 1 tablet by mouth 2 (two) times daily.    ? buPROPion (WELLBUTRIN XL) 300 MG 24 hr tablet Take 300 mg by mouth every morning.    ? calcium-vitamin D (OSCAL WITH D) 500-200 MG-UNIT tablet Take 1 tablet by mouth daily.    ? CINNAMON PO Take 2.5 mLs by mouth daily.    ? diclofenac sodium (VOLTAREN) 1 % GEL Apply 2 g topically daily as needed (pain).    ? empagliflozin (JARDIANCE) 10 MG TABS tablet Take 10 mg by  mouth daily.    ? flecainide (TAMBOCOR) 50 MG tablet Take 1 tablet (50 mg total) by mouth 2 (two) times daily. 60 tablet 3  ? gemfibrozil (LOPID) 600 MG tablet Take 600 mg by mouth 2 (two) times daily before a meal.     ? Ginkgo Biloba 120 MG CAPS Take 120 mg by mouth daily.    ? GLUCOMANNAN PO Take 200 mg by mouth at bedtime.    ? latanoprost (XALATAN) 0.005 % ophthalmic solution Place 1 drop into both eyes at bedtime.    ? MAGNESIUM CITRATE PO Take 250 mg by mouth daily.    ? Melatonin 5 MG CAPS Take 5 mg by mouth at bedtime.    ? mirabegron ER (MYRBETRIQ) 25 MG TB24 tablet Take 25 mg by mouth every other day.    ? omeprazole (PRILOSEC) 40 MG capsule Take 40 mg by mouth daily.     ? polyethylene glycol (MIRALAX / GLYCOLAX) packet Take 17 g by mouth daily.    ? pregabalin (LYRICA) 75 MG capsule Take 75 mg by mouth 2 (two) times daily.    ? Vitamin D, Ergocalciferol, (DRISDOL) 1.25 MG (50000 UNIT) CAPS capsule Take 50,000 Units by mouth once a week.    ? ?No current facility-administered medications for this visit.  ? ? ?Allergies:   Cefdinir, Penicillin g, Yeast-related products, and Penicillins  ? ?Social History:  The patient  reports that she quit smoking about 23 years ago. Her smoking use included cigarettes. She has never used smokeless tobacco. She reports current alcohol use. She reports that she does not use drugs.  ? ?Family History:  The patient's family history includes Dementia in her mother; Heart disease in her maternal grandfather; Hypertension in her mother and paternal grandmother; Other in her father; Stroke in her paternal grandmother.  ? ? ?ROS:  Please see the history of present illness.   Otherwise, review of systems is positive for none.   All other systems are reviewed and negative.  ? ? ?PHYSICAL EXAM: ?VS:  BP 106/60   Pulse 70   Ht '5\' 6"'$  (1.676 m)   Wt 168 lb (76.2 kg)   SpO2 97%   BMI 27.12 kg/m?  , BMI Body mass index is 27.12 kg/m?. ?GEN: Well nourished, well developed, in no  acute distress  ?HEENT: normal  ?Neck: no JVD, carotid bruits, or masses ?Cardiac: irregular; no murmurs, rubs, or gallops,no edema  ?Respiratory:  clear to auscultation bilaterally, normal work of breathing ?GI: soft, nontender, nondistended, + BS ?MS: no deformity or atrophy  ?Skin: warm and dry ?Neuro:  Strength and sensation are intact ?Psych: euthymic mood, full affect ? ?EKG:  EKG is ordered today. ?Personal review of the  ekg ordered shows rhythm, PVCs ? ?Recent Labs: ?11/20/2020: Hemoglobin 10.9; Platelets 295 ?02/08/2021: BUN 19; Creatinine, Ser 1.03; Potassium 4.8; Sodium 139  ? ? ?Lipid Panel  ?No results found for: CHOL, TRIG, HDL, CHOLHDL, VLDL, LDLCALC, LDLDIRECT ? ? ?Wt Readings from Last 3 Encounters:  ?03/18/21 168 lb (76.2 kg)  ?01/25/21 162 lb 3.2 oz (73.6 kg)  ?11/29/20 160 lb 6.4 oz (72.8 kg)  ?  ? ? ?Other studies Reviewed: ?Additional studies/ records that were reviewed today include: TTE 12/20/20  ?Review of the above records today demonstrates:  ? 1. Left ventricular ejection fraction, by estimation, is 60 to 65%. The  ?left ventricle has normal function. The left ventricle has no regional  ?wall motion abnormalities. There is moderate asymmetric left ventricular  ?hypertrophy of the basal-septal  ?segment. Left ventricular diastolic parameters are consistent with Grade I  ?diastolic dysfunction (impaired relaxation).  ? 2. Right ventricular systolic function is normal. The right ventricular  ?size is normal.  ? 3. Left atrial size was mild to moderately dilated.  ? 4. The mitral valve is abnormal. There is mild bileaflet prolapse (more  ?notable prolapse of the posterior mitral valve leaflet) with resultant  ?mild mitral valve regurgitation. No evidence of mitral stenosis.  ? 5. The aortic valve is tricuspid. There is mild thickening of the aortic  ?valve. Aortic valve regurgitation is not visualized. Aortic valve  ?sclerosis is present, with no evidence of aortic valve stenosis.  ? 6. Aortic  dilatation noted. There is borderline dilatation of the  ?ascending aorta, measuring 36 mm.  ? 7. The inferior vena cava is normal in size with greater than 50%  ?respiratory variability, suggesting right atrial pres

## 2021-03-24 DIAGNOSIS — L820 Inflamed seborrheic keratosis: Secondary | ICD-10-CM | POA: Diagnosis not present

## 2021-03-24 DIAGNOSIS — L821 Other seborrheic keratosis: Secondary | ICD-10-CM | POA: Diagnosis not present

## 2021-03-24 DIAGNOSIS — R208 Other disturbances of skin sensation: Secondary | ICD-10-CM | POA: Diagnosis not present

## 2021-03-24 DIAGNOSIS — I872 Venous insufficiency (chronic) (peripheral): Secondary | ICD-10-CM | POA: Diagnosis not present

## 2021-03-24 DIAGNOSIS — L538 Other specified erythematous conditions: Secondary | ICD-10-CM | POA: Diagnosis not present

## 2021-03-24 DIAGNOSIS — D2372 Other benign neoplasm of skin of left lower limb, including hip: Secondary | ICD-10-CM | POA: Diagnosis not present

## 2021-03-24 DIAGNOSIS — L814 Other melanin hyperpigmentation: Secondary | ICD-10-CM | POA: Diagnosis not present

## 2021-03-24 DIAGNOSIS — L570 Actinic keratosis: Secondary | ICD-10-CM | POA: Diagnosis not present

## 2021-03-25 ENCOUNTER — Ambulatory Visit: Payer: Medicare Other

## 2021-03-25 ENCOUNTER — Other Ambulatory Visit: Payer: Self-pay

## 2021-03-25 VITALS — HR 58 | Ht 66.0 in | Wt 171.0 lb

## 2021-03-25 DIAGNOSIS — I493 Ventricular premature depolarization: Secondary | ICD-10-CM | POA: Diagnosis not present

## 2021-03-25 NOTE — Progress Notes (Signed)
? ?  Nurse Visit  ?  ?Date of Encounter: 03/25/2021 ?ID: Delton Coombes, DOB 10/26/1940, MRN [id number] ? ?PCP:  Kelton Pillar, MD ?  ?Cardiologist:  Glenetta Hew, MD  ?Advanced Practice Provider:  No care team member to display ?Electrophysiologist:  Allegra Lai, MD ?[id number]} ? ? ?Visit Details  ? ?VS:  Pulse (!) 58   Ht '5\' 6"'$  (1.676 m)   Wt 171 lb (77.6 kg)   BMI 27.60 kg/m?  , BMI Body mass index is 27.6 kg/m?. ? ?Wt Readings from Last 3 Encounters:  ?03/25/21 171 lb (77.6 kg)  ?03/18/21 168 lb (76.2 kg)  ?01/25/21 162 lb 3.2 oz (73.6 kg)  ?  ? ?Reason for visit: EKG s/p Flecainide 50 mg BID start for elevated PVC burden ?Performed today: Vitals, EKG, Provider consulted: Dr. Lovena Le (DOD), and Education ?Changes (medications, testing, etc.) : No changes at this time. Defer to Dr. Curt Bears. ?Length of Visit: 30 minutes ? ?Medications Adjustments/Labs and Tests Ordered: ?No orders of the defined types were placed in this encounter. ? ?No orders of the defined types were placed in this encounter. ? ?Assessment: ?Patient states that she still sometimes feels her heart "thumping" when she is at rest. Denies having any chest pain, SOB, lightheadedness, or any other Sx. Patient's EKG NSR 58 bpm. After EKG was completed patient started feeling her heart "thumping". Rhythm Strip obtained that showed bigeminal and trigeminal PVCs. Reviewed with DOD. No changes at this time. Will defer to Dr. Curt Bears. ? ?Signed, ?Cleon Gustin, RN  ?03/25/2021 3:46 PM ? ? ? ? ?

## 2021-03-25 NOTE — Patient Instructions (Signed)
Medication Instructions:  ?Your physician recommends that you continue on your current medications as directed. Please refer to the Current Medication list given to you today. ? ?*If you need a refill on your cardiac medications before your next appointment, please call your pharmacy* ? ? ?Lab Work: ?None ? ?If you have labs (blood work) drawn today and your tests are completely normal, you will receive your results only by: ?MyChart Message (if you have MyChart) OR ?A paper copy in the mail ?If you have any lab test that is abnormal or we need to change your treatment, we will call you to review the results. ? ? ?Testing/Procedures: ?None ? ? ?Follow-Up: ?As scheduled ?

## 2021-03-30 ENCOUNTER — Telehealth: Payer: Self-pay | Admitting: Cardiology

## 2021-03-30 NOTE — Telephone Encounter (Signed)
Spoke with patient, states she is having more SOB than usual. She has SOB with activity and some at rest, noticed this morning. ? ?Patient walking around house trying to get ready for volunteer event this afternoon, audibly short of breath over the phone. BP 132/92, HR 50, O2 sat 99% on room air (readings taken while on phone with patient). ? ?Patient also reports "it feels like my heart is thumping in my throat." She denies any CP or dizziness.  ? ?Offered patient next available appointment with DOD, patient agreeable to see Dr. Angelena Form 03/31/21 at 11:30AM. Appointment scheduled. ? ?Advised patient to go to the ED or urgent care to be evaluated today. ED precautions given. Patient states she will go if she feels like she cannot wait until appt tomorrow. ?

## 2021-03-30 NOTE — Telephone Encounter (Signed)
Pt c/o Shortness Of Breath: STAT if SOB developed within the last 24 hours or pt is noticeably SOB on the phone ? ?1. Are you currently SOB (can you hear that pt is SOB on the phone)? no ? ?2. How long have you been experiencing SOB? Since yesterday it was more noticeable. This morning she has to take deep breaths.  ? ?3. Are you SOB when sitting or when up moving around? Up moving around ? ?4. Are you currently experiencing any other symptoms? Just the SOB, the heart beat feels like it in her throat.   ?

## 2021-03-30 NOTE — Progress Notes (Signed)
? ?Chief Complaint  ?Patient presents with  ? Follow-up  ?  Dyspnea  ? ?History of Present Illness: 81 yo female with history of hyperlipidemia, DM, mitral valve prolapse and PVCs who is added onto my schedule today with c/o dyspnea and palpitations. She is followed in our office by Dr. Ellyn Hack. She is known to have PVCs and has been seen by Dr. Lennie Odor on 03/18/21. She was started on flecainide 50 mg BID at her visit with Dr. Lennie Odor. Echo 12/20/20 with LVEF=60-65%, mild mitral regurgitation. Nuclear stress test February 2023 with no ischemia. Coronary CTA cancelled in February 2023 given PVC burden. She called our office with c/o dyspnea and heart skipping yesterday. She tells me that she feels well overall but has no energy and can feel her heart skipping.  ? ?Primary Care Physician: Kelton Pillar, MD ? ?Past Medical History:  ?Diagnosis Date  ? Anemia   ? Anxiety   ? mild  ? 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 Diabetes mellitus type 2, controlled, without complications ([place])   ? GERD (gastroesophageal reflux disease)   ? Hyperlipidemia   ? Mitral valve prolapse   ? childhood murmur, per patient  ? Pneumonia   ? with COVID in 2021  ? ? ?Past Surgical History:  ?Procedure Laterality Date  ? ABDOMINAL HYSTERECTOMY    ? CARPAL TUNNEL RELEASE    ? left   ? CHOLECYSTECTOMY N/A 03/14/2017  ? Procedure: LAPAROSCOPIC CHOLECYSTECTOMY;  Surgeon: Coralie Keens, MD;  [facility]: [facility];  Service: General;  Laterality: N/A;  ? EXCISION/RELEASE BURSA HIP  07/26/2011  ? Procedure: EXCISION/RELEASE BURSA HIP;  Surgeon: Gearlean Alf, MD;  Location: [facility];  Service: Orthopedics;  Laterality: Left;  Left Hip Bursectomy with Gluteal Tendon Repair  ? EYE SURGERY    ? bilateral cataracts  ? REVERSE SHOULDER ARTHROPLASTY Right 11/18/2020  ? Procedure: RIGHT REVERSE SHOULDER ARTHROPLASTY;  Surgeon: Meredith Pel, MD;  Location: [facility];  Service: Orthopedics;  Laterality: Right;  ? ROTATOR CUFF REPAIR    ? bilateral   ?  TONSILLECTOMY    ? TRANSTHORACIC ECHOCARDIOGRAM  12/2020  ? EF 60 to 65%.  No R WMA.  Moderate LVH worse in the basal septal region.  GR 1 DD.  Mild to moderate LA dilation.  Normal RV size and function.  Normal RAP.  Mild MVP (most notable posterior).  Mild MR.  Aortic valve sclerosis with no stenosis.  ? vaginal wall repair     ? ZIO PATCH MONITOR  12/2020  ? Mostly sinus rhythm (rate range 50-97 bpm, average 67).  Rare PACs.  Frequent (19.9%) PVCs, frequent (6%) PVC couplets with prolonged spells of ventricular bigeminy or trigeminy.  369 4-5 beat runs of PVCs (fastest 180 bpm, 4 beats, longest 5 beats 410 bpm).  Mostly noted symptoms were PVC singles couplets, triplets and PVC  bigeminy/trigeminy.  4-5 beat runs not as frequently noted on Sx log  ? ? ?Current Outpatient Medications  ?Medication Sig Dispense Refill  ? acetaminophen (TYLENOL) 650 MG CR tablet Take 1,300 mg by mouth every 8 (eight) hours as needed for pain.    ? aspirin EC 81 MG EC tablet Take 1 tablet (81 mg total) by mouth daily. Swallow whole. 30 tablet 11  ? atorvastatin (LIPITOR) 20 MG tablet Take 20 mg by mouth every other day.    ? B Complex-C (B-COMPLEX WITH VITAMIN C) tablet Take 1 tablet by mouth 2 (two) times daily.    ?  buPROPion (WELLBUTRIN XL) 300 MG 24 hr tablet Take 300 mg by mouth every morning.    ? calcium-vitamin D (OSCAL WITH D) 500-200 MG-UNIT tablet Take 1 tablet by mouth daily.    ? CINNAMON PO Take 2.5 mLs by mouth daily.    ? diclofenac sodium (VOLTAREN) 1 % GEL Apply 2 g topically daily as needed (pain).    ? empagliflozin (JARDIANCE) 10 MG TABS tablet Take 10 mg by mouth daily.    ? flecainide (TAMBOCOR) 100 MG tablet Take 1 tablet (100 mg total) by mouth 2 (two) times daily. 180 tablet 3  ? gemfibrozil (LOPID) 600 MG tablet Take 600 mg by mouth 2 (two) times daily before a meal.     ? Ginkgo Biloba 120 MG CAPS Take 120 mg by mouth daily.    ? GLUCOMANNAN PO Take 200 mg by mouth at bedtime.    ? latanoprost (XALATAN)  0.005 % ophthalmic solution Place 1 drop into both eyes at bedtime.    ? MAGNESIUM CITRATE PO Take 250 mg by mouth daily.    ? Melatonin 5 MG CAPS Take 5 mg by mouth at bedtime.    ? mirabegron ER (MYRBETRIQ) 25 MG TB24 tablet Take 25 mg by mouth every other day.    ? omeprazole (PRILOSEC) 40 MG capsule Take 40 mg by mouth daily.     ? polyethylene glycol (MIRALAX / GLYCOLAX) packet Take 17 g by mouth daily.    ? pregabalin (LYRICA) 75 MG capsule Take 75 mg by mouth 2 (two) times daily.    ? Vitamin D, Ergocalciferol, (DRISDOL) 1.25 MG (50000 UNIT) CAPS capsule Take 50,000 Units by mouth once a week.    ? ?No current facility-administered medications for this visit.  ? ? ?Allergies  ?Allergen Reactions  ? Cefdinir Hives and Other (See Comments)  ?  Myalgia ?  ? Penicillin G   ?  Other reaction(s): itching  ? Yeast-Related Products   ?  Took allergy shots for it. ?Other reaction(s): Unknown  ? Penicillins Rash and Other (See Comments)  ?   ?  ? ? ?Social History  ? ?Socioeconomic History  ? Marital status: Married  ?  Spouse name: Not on file  ? Number of children: Not on file  ? Years of education: Not on file  ? Highest education level: Not on file  ?Occupational History  ? Not on file  ?Tobacco Use  ? Smoking status: Former  ?  Types: Cigarettes  ?  Quit date: 01/02/1998  ?  Years since quitting: 23.2  ? Smokeless tobacco: Never  ?Vaping Use  ? Vaping Use: Never used  ?Substance and Sexual Activity  ? Alcohol use: Yes  ?  Comment: occasional   ? Drug use: No  ? Sexual activity: Not on file  ?Other Topics Concern  ? Not on file  ?Social History Narrative  ? Not on file  ? ?Social Determinants of Health  ? ?Financial Resource Strain: Not on file  ?Food Insecurity: Not on file  ?Transportation Needs: Not on file  ?Physical Activity: Not on file  ?Stress: Not on file  ?Social Connections: Not on file  ?Intimate Partner Violence: Not on file  ? ? ?Family History  ?Problem Relation Age of Onset  ? Hypertension Mother   ?  Dementia Mother   ? Other Father   ?     enlarged heart  ? Heart disease Maternal Grandfather   ? Stroke Paternal Grandmother   ?  Hypertension Paternal Grandmother   ? ? ?Review of Systems:  As stated in the HPI and otherwise negative.  ? ?BP 90/60   Pulse 69   Ht '5\' 6"'$  (1.676 m)   Wt 171 lb (77.6 kg)   SpO2 98%   BMI 27.60 kg/m?  ? ?Physical Examination: ?General: Well developed, well nourished, NAD  ?HEENT: OP clear, mucus membranes moist  ?SKIN: warm, dry. No rashes. ?Neuro: No focal deficits  ?Musculoskeletal: Muscle strength 5/5 all ext  ?Psychiatric: Mood and affect normal  ?Neck: No JVD, no carotid bruits, no thyromegaly, no lymphadenopathy.  ?Lungs:Clear bilaterally, no wheezes, rhonci, crackles ?Cardiovascular: Regular rate and rhythm. No murmurs, gallops or rubs. ?Abdomen:Soft. Bowel sounds present. Non-tender.  ?Extremities: No lower extremity edema. Pulses are 2 + in the bilateral DP/PT. ? ?EKG:  EKG is ordered today. ?The ekg ordered today demonstrates Sinus, PVCs with bigeminy ? ?Recent Labs: ?11/20/2020: Hemoglobin 10.9; Platelets 295 ?02/08/2021: BUN 19; Creatinine, Ser 1.03; Potassium 4.8; Sodium 139  ? ?Lipid Panel ?No results found for: CHOL, TRIG, HDL, CHOLHDL, VLDL, LDLCALC, LDLDIRECT ?  ?Wt Readings from Last 3 Encounters:  ?03/31/21 171 lb (77.6 kg)  ?03/25/21 171 lb (77.6 kg)  ?03/18/21 168 lb (76.2 kg)  ?  ? ?Assessment and Plan:  ? ?1.  PVCs/bigeminy: She is known to have PVCs and was recently seen in our EP clinic by Dr. Lennie Odor and Flecainide was started. Now with more frequent PVCs, dyspnea. I have reviewed her presentation with Dr. Lennie Odor. Will increase her Flecainide to 100 mg BID. EKG in one week. Close f/u with Dr. Lennie Odor.  ? ?Labs/ tests ordered today include:  ? ?Orders Placed This Encounter  ?Procedures  ? EKG 12-Lead  ? ? ? ?Disposition:   F/U with Dr. Lennie Odor in 1-2 weeks.  ? ? ?Signed, ?Lauree Chandler, MD ?03/31/2021 12:33 PM    ?[facility] ?[address], [place], [place]  [address] ?Phone: [phone]; Fax: [phone]  ? ? ?

## 2021-03-31 ENCOUNTER — Encounter: Payer: Self-pay | Admitting: Cardiovascular Disease

## 2021-03-31 ENCOUNTER — Ambulatory Visit: Payer: Medicare Other | Admitting: Cardiovascular Disease

## 2021-03-31 VITALS — BP 90/60 | HR 69 | Ht 66.0 in | Wt 171.0 lb

## 2021-03-31 DIAGNOSIS — I493 Ventricular premature depolarization: Secondary | ICD-10-CM

## 2021-03-31 MED ORDER — FLECAINIDE ACETATE 100 MG PO TABS
100.0000 mg | ORAL_TABLET | Freq: Two times a day (BID) | ORAL | 3 refills | Status: DC
Start: 2021-03-31 — End: 2021-11-08

## 2021-03-31 NOTE — Patient Instructions (Signed)
Medication Instructions:  ?Your physician has recommended you make the following change in your medication:  ?1.) Increase flecainide to 100 mg - take one tablet twice a day ? ?*If you need a refill on your cardiac medications before your next appointment, please call your pharmacy* ? ? ?Lab Work: ?none ?If you have labs (blood work) drawn today and your tests are completely normal, you will receive your results only by: ?MyChart Message (if you have MyChart) OR ?A paper copy in the mail ?If you have any lab test that is abnormal or we need to change your treatment, we will call you to review the results. ? ? ?Testing/Procedures: ?Nurse visit for EKG in about 1 week for Flecainide increase ? ? ?Follow-Up: ?We will call you with an appointment for in the next few weeks. ? ?Other Instructions ?Nurse visit for EKG for med change in about 1 week.  ?

## 2021-04-07 ENCOUNTER — Encounter: Payer: Self-pay | Admitting: Podiatry

## 2021-04-07 ENCOUNTER — Ambulatory Visit: Payer: Medicare Other

## 2021-04-07 ENCOUNTER — Ambulatory Visit: Payer: Medicare Other | Admitting: Cardiology

## 2021-04-07 ENCOUNTER — Encounter: Payer: Self-pay | Admitting: Cardiology

## 2021-04-07 VITALS — BP 132/66 | HR 57 | Ht 66.0 in | Wt 172.0 lb

## 2021-04-07 DIAGNOSIS — I493 Ventricular premature depolarization: Secondary | ICD-10-CM | POA: Diagnosis not present

## 2021-04-07 NOTE — Patient Instructions (Addendum)
Medication Instructions:  ?Your physician recommends that you continue on your current medications as directed. Please refer to the Current Medication list given to you today. ? ?*If you need a refill on your cardiac medications before your next appointment, please call your pharmacy* ? ? ?Lab Work: ?None ordered ? ? ? ?Testing/Procedures: ?None ordered ? ? ?Follow-Up: ?At [facility], you and your health needs are our priority.  As part of our continuing mission to provide you with exceptional heart care, we have created designated Provider Care Teams.  These Care Teams include your primary Cardiologist (physician) and Advanced Practice Providers (APPs -  Physician Assistants and Nurse Practitioners) who all work together to provide you with the care you need, when you need it. ? ?Your next appointment:   ?3 month(s) ? ?The format for your next appointment:   ?In Person ? ?Provider:   ?You will see one of the following Advanced Practice Providers on your designated Care Team:   ?Tommye Standard, PA-C ?Legrand Como "Jonni Sanger" Cleone, PA-C ? ? ? ?Thank you for choosing [facility]!! ? ? ?Trinidad Curet, RN ?[phone] ? ?  ?

## 2021-04-07 NOTE — Progress Notes (Signed)
? ?Electrophysiology Office Note ? ? ?Date:  04/07/2021  ? ?ID:  Delton Coombes, DOB Mar 31, 1940, MRN [id number] ? ?PCP:  Kelton Pillar, MD  ?Cardiologist:  Ellyn Hack ?Primary Electrophysiologist:  Sabrena Gavitt Meredith Leeds, MD   ? ?Chief Complaint: PVC ?  ?History of Present Illness: ?Rose Smith is a 81 y.o. female who is being seen today for the evaluation of PVC at the request of Kelton Pillar, MD. Presenting today for electrophysiology evaluation. ? ?She has a history significant for hyperlipidemia, type 2 diabetes, mitral valve prolapse.  She wore a cardiac monitor that showed an elevated PVC burden.  She felt palpitations mainly when she was laying down and relaxing at night.  She was started on flecainide 50 mg.  She continued to have palpitations and saw the DOD who increase flecainide to 100 mg. ? ?Today, denies symptoms of palpitations, chest pain, shortness of breath, orthopnea, PND, lower extremity edema, claudication, dizziness, presyncope, syncope, bleeding, or neurologic sequela. The patient is tolerating medications without difficulties.  Her increased dose of flecainide, she has felt improved without symptoms of shortness of breath.  She feels that her PVC burden is significantly reduced.  She does continue to have fatigue, but overall feels well. ? ?Past Medical History:  ?Diagnosis Date  ? Anemia   ? Anxiety   ? mild  ? 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 Diabetes mellitus type 2, controlled, without complications ([place])   ? GERD (gastroesophageal reflux disease)   ? Hyperlipidemia   ? Mitral valve prolapse   ? childhood murmur, per patient  ? Pneumonia   ? with COVID in 2021  ? ?Past Surgical History:  ?Procedure Laterality Date  ? ABDOMINAL HYSTERECTOMY    ? CARPAL TUNNEL RELEASE    ? left   ? CHOLECYSTECTOMY N/A 03/14/2017  ? Procedure: LAPAROSCOPIC CHOLECYSTECTOMY;  Surgeon: Coralie Keens, MD;  [facility]: [facility];  Service: General;  Laterality: N/A;  ? EXCISION/RELEASE BURSA HIP  07/26/2011  ?  Procedure: EXCISION/RELEASE BURSA HIP;  Surgeon: Gearlean Alf, MD;  [facility];  Service: Orthopedics;  Laterality: Left;  Left Hip Bursectomy with Gluteal Tendon Repair  ? EYE SURGERY    ? bilateral cataracts  ? REVERSE SHOULDER ARTHROPLASTY Right 11/18/2020  ? Procedure: RIGHT REVERSE SHOULDER ARTHROPLASTY;  Surgeon: Meredith Pel, MD;  Location: [facility];  Service: Orthopedics;  Laterality: Right;  ? ROTATOR CUFF REPAIR    ? bilateral   ? TONSILLECTOMY    ? TRANSTHORACIC ECHOCARDIOGRAM  12/2020  ? EF 60 to 65%.  No R WMA.  Moderate LVH worse in the basal septal region.  GR 1 DD.  Mild to moderate LA dilation.  Normal RV size and function.  Normal RAP.  Mild MVP (most notable posterior).  Mild MR.  Aortic valve sclerosis with no stenosis.  ? vaginal wall repair     ? ZIO PATCH MONITOR  12/2020  ? Mostly sinus rhythm (rate range 50-97 bpm, average 67).  Rare PACs.  Frequent (19.9%) PVCs, frequent (6%) PVC couplets with prolonged spells of ventricular bigeminy or trigeminy.  369 4-5 beat runs of PVCs (fastest 180 bpm, 4 beats, longest 5 beats 410 bpm).  Mostly noted symptoms were PVC singles couplets, triplets and PVC  bigeminy/trigeminy.  4-5 beat runs not as frequently noted on Sx log  ? ? ? ?Current Outpatient Medications  ?Medication Sig Dispense Refill  ? acetaminophen (TYLENOL) 650 MG CR tablet Take 1,300 mg by mouth every 8 (eight)  hours as needed for pain.    ? aspirin EC 81 MG EC tablet Take 1 tablet (81 mg total) by mouth daily. Swallow whole. 30 tablet 11  ? atorvastatin (LIPITOR) 20 MG tablet Take 20 mg by mouth every other day.    ? B Complex-C (B-COMPLEX WITH VITAMIN C) tablet Take 1 tablet by mouth 2 (two) times daily.    ? buPROPion (WELLBUTRIN XL) 300 MG 24 hr tablet Take 300 mg by mouth every morning.    ? calcium-vitamin D (OSCAL WITH D) 500-200 MG-UNIT tablet Take 1 tablet by mouth daily.    ? CINNAMON PO Take 2.5 mLs by mouth daily.    ? diclofenac sodium (VOLTAREN) 1 % GEL Apply 2 g  topically daily as needed (pain).    ? empagliflozin (JARDIANCE) 10 MG TABS tablet Take 10 mg by mouth daily.    ? flecainide (TAMBOCOR) 100 MG tablet Take 1 tablet (100 mg total) by mouth 2 (two) times daily. 180 tablet 3  ? gemfibrozil (LOPID) 600 MG tablet Take 600 mg by mouth 2 (two) times daily before a meal.     ? Ginkgo Biloba 120 MG CAPS Take 120 mg by mouth daily.    ? GLUCOMANNAN PO Take 200 mg by mouth at bedtime.    ? latanoprost (XALATAN) 0.005 % ophthalmic solution Place 1 drop into both eyes at bedtime.    ? MAGNESIUM CITRATE PO Take 250 mg by mouth daily.    ? Melatonin 5 MG CAPS Take 5 mg by mouth at bedtime.    ? mirabegron ER (MYRBETRIQ) 25 MG TB24 tablet Take 25 mg by mouth every other day.    ? omeprazole (PRILOSEC) 40 MG capsule Take 40 mg by mouth daily.     ? polyethylene glycol (MIRALAX / GLYCOLAX) packet Take 17 g by mouth daily.    ? pregabalin (LYRICA) 75 MG capsule Take 75 mg by mouth 2 (two) times daily.    ? Vitamin D, Ergocalciferol, (DRISDOL) 1.25 MG (50000 UNIT) CAPS capsule Take 50,000 Units by mouth once a week.    ? ?No current facility-administered medications for this visit.  ? ? ?Allergies:   Cefdinir, Penicillin g, Yeast-related products, and Penicillins  ? ?Social History:  The patient  reports that she quit smoking about 23 years ago. Her smoking use included cigarettes. She has never used smokeless tobacco. She reports current alcohol use. She reports that she does not use drugs.  ? ?Family History:  The patient's family history includes Dementia in her mother; Heart disease in her maternal grandfather; Hypertension in her mother and paternal grandmother; Other in her father; Stroke in her paternal grandmother.  ? ?ROS:  Please see the history of present illness.   Otherwise, review of systems is positive for none.   All other systems are reviewed and negative.  ? ?PHYSICAL EXAM: ?VS:  BP 132/66   Pulse (!) 57   Ht '5\' 6"'$  (1.676 m)   Wt 172 lb (78 kg)   SpO2 95%   BMI  27.76 kg/m?  , BMI Body mass index is 27.76 kg/m?. ?GEN: Well nourished, well developed, in no acute distress  ?HEENT: normal  ?Neck: no JVD, carotid bruits, or masses ?Cardiac: RRR; no murmurs, rubs, or gallops,no edema  ?Respiratory:  clear to auscultation bilaterally, normal work of breathing ?GI: soft, nontender, nondistended, + BS ?MS: no deformity or atrophy  ?Skin: warm and dry ?Neuro:  Strength and sensation are intact ?Psych: euthymic mood, full  affect ? ?EKG:  EKG is ordered today. ?Personal review of the ekg ordered shows sinus arrhythmia ? ?Recent Labs: ?11/20/2020: Hemoglobin 10.9; Platelets 295 ?02/08/2021: BUN 19; Creatinine, Ser 1.03; Potassium 4.8; Sodium 139  ? ? ?Lipid Panel  ?No results found for: CHOL, TRIG, HDL, CHOLHDL, VLDL, LDLCALC, LDLDIRECT ? ? ?Wt Readings from Last 3 Encounters:  ?04/07/21 172 lb (78 kg)  ?03/31/21 171 lb (77.6 kg)  ?03/25/21 171 lb (77.6 kg)  ?  ? ? ?Other studies Reviewed: ?Additional studies/ records that were reviewed today include: TTE 12/20/20  ?Review of the above records today demonstrates:  ? 1. Left ventricular ejection fraction, by estimation, is 60 to 65%. The  ?left ventricle has normal function. The left ventricle has no regional  ?wall motion abnormalities. There is moderate asymmetric left ventricular  ?hypertrophy of the basal-septal  ?segment. Left ventricular diastolic parameters are consistent with Grade I  ?diastolic dysfunction (impaired relaxation).  ? 2. Right ventricular systolic function is normal. The right ventricular  ?size is normal.  ? 3. Left atrial size was mild to moderately dilated.  ? 4. The mitral valve is abnormal. There is mild bileaflet prolapse (more  ?notable prolapse of the posterior mitral valve leaflet) with resultant  ?mild mitral valve regurgitation. No evidence of mitral stenosis.  ? 5. The aortic valve is tricuspid. There is mild thickening of the aortic  ?valve. Aortic valve regurgitation is not visualized. Aortic valve   ?sclerosis is present, with no evidence of aortic valve stenosis.  ? 6. Aortic dilatation noted. There is borderline dilatation of the  ?ascending aorta, measuring 36 mm.  ? 7. The inferior vena cava is norm

## 2021-04-12 ENCOUNTER — Ambulatory Visit: Payer: Medicare Other | Admitting: Cardiology

## 2021-04-13 ENCOUNTER — Encounter: Payer: Medicare Other | Admitting: Podiatry

## 2021-04-20 ENCOUNTER — Encounter: Payer: Medicare Other | Admitting: Podiatry

## 2021-04-26 ENCOUNTER — Other Ambulatory Visit: Payer: Self-pay | Admitting: Podiatry

## 2021-04-26 ENCOUNTER — Telehealth: Payer: Self-pay | Admitting: *Deleted

## 2021-04-26 MED ORDER — MELOXICAM 15 MG PO TABS: 15.0000 mg | ORAL_TABLET | Freq: Every day | ORAL | 1 refills | Status: DC

## 2021-04-26 MED ORDER — OXYCODONE-ACETAMINOPHEN 5-325 MG PO TABS: 1.0000 | ORAL_TABLET | ORAL | 0 refills | Status: DC | PRN

## 2021-04-26 NOTE — Progress Notes (Signed)
PRN postop 

## 2021-04-26 NOTE — Telephone Encounter (Signed)
Prescriptions sent to the pharmacy.  Please notify patient.  Thanks, Dr. Amalia Hailey

## 2021-04-26 NOTE — Telephone Encounter (Signed)
Patient is requesting that her pain medicine be sent to pharmacy ahead of time, having surgery on Thursday, will not have to stop and pick up afterwards. Please advise. ?

## 2021-04-27 NOTE — Telephone Encounter (Signed)
Patient has been notified

## 2021-04-28 DIAGNOSIS — M2041 Other hammer toe(s) (acquired), right foot: Secondary | ICD-10-CM | POA: Diagnosis not present

## 2021-04-28 DIAGNOSIS — M2042 Other hammer toe(s) (acquired), left foot: Secondary | ICD-10-CM | POA: Diagnosis not present

## 2021-05-04 ENCOUNTER — Other Ambulatory Visit: Payer: Self-pay | Admitting: *Deleted

## 2021-05-04 ENCOUNTER — Encounter: Payer: Medicare Other | Admitting: Podiatry

## 2021-05-04 ENCOUNTER — Ambulatory Visit (INDEPENDENT_AMBULATORY_CARE_PROVIDER_SITE_OTHER): Payer: Medicare Other | Admitting: Podiatry

## 2021-05-04 ENCOUNTER — Ambulatory Visit (INDEPENDENT_AMBULATORY_CARE_PROVIDER_SITE_OTHER): Payer: Medicare Other

## 2021-05-04 DIAGNOSIS — Z9889 Other specified postprocedural states: Secondary | ICD-10-CM

## 2021-05-04 DIAGNOSIS — M25569 Pain in unspecified knee: Secondary | ICD-10-CM

## 2021-05-04 NOTE — Progress Notes (Signed)
? ?  Subjective:  ?Patient presents today status post hammertoe repair digits 3, 4 right foot as well as the fifth digit left foot. DOS: 04/28/2021.  Patient states that she is feeling very well.  Minimal pain.  She has been weightbearing in the postsurgical shoes ? ?Past Medical History:  ?Diagnosis Date  ? Anemia   ? Anxiety   ? mild  ? 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 Diabetes mellitus type 2, controlled, without complications ([place])   ? GERD (gastroesophageal reflux disease)   ? Hyperlipidemia   ? Mitral valve prolapse   ? childhood murmur, per patient  ? Pneumonia   ? with COVID in 2021  ? ?  ? ?Objective/Physical Exam ?Neurovascular status intact.  Skin incisions appear to be well coapted with sutures  intact. No sign of infectious process noted. No dehiscence. No active bleeding noted. Moderate edema noted to the surgical extremity. ? ?Radiographic Exam:  ?Orthopedic hardware and osteotomies sites appear to be stable with routine healing.  Good alignment of the percutaneous fixation pins right foot. ? ?Assessment: ?1. s/p hammertoe repair 3, 4 right.  5 left.. DOS: 04/28/2021 ? ? ?Plan of Care:  ?1. Patient was evaluated. X-rays reviewed ?2.  Dressings changed. ?3.  Continue weightbearing in the surgical shoes or open toed shoes ?4.  Return to clinic 1 week for suture removal ? ? ?Edrick Kins, DPM ?[place] ? ?Dr. Edrick Kins, DPM  ?  ?[address]                                    ?[place], [place] [address]                ?Office [phone]  ?Fax [phone] ? ? ? ? ? ?

## 2021-05-11 ENCOUNTER — Ambulatory Visit (INDEPENDENT_AMBULATORY_CARE_PROVIDER_SITE_OTHER): Payer: Medicare Other | Admitting: Podiatry

## 2021-05-11 ENCOUNTER — Encounter: Payer: Self-pay | Admitting: Podiatry

## 2021-05-11 ENCOUNTER — Encounter: Payer: Medicare Other | Admitting: Podiatry

## 2021-05-11 DIAGNOSIS — Z9889 Other specified postprocedural states: Secondary | ICD-10-CM

## 2021-05-11 NOTE — Progress Notes (Signed)
? ?Subjective:  ?Patient presents today status post hammertoe repair digits 3, 4 right foot as well as the fifth digit left foot. DOS: 04/28/2021.  Patient continues to do well.  She does experience some numbness and tingling type sensations to the bilateral feet.  Minimal pain ? ?Past Medical History:  ?Diagnosis Date  ? Anemia   ? Anxiety   ? mild  ? 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 Diabetes mellitus type 2, controlled, without complications ([place])   ? GERD (gastroesophageal reflux disease)   ? Hyperlipidemia   ? Mitral valve prolapse   ? childhood murmur, per patient  ? Pneumonia   ? with COVID in 2021  ? ?  ?Past Surgical History:  ?Procedure Laterality Date  ? ABDOMINAL HYSTERECTOMY    ? CARPAL TUNNEL RELEASE    ? left   ? CHOLECYSTECTOMY N/A 03/14/2017  ? Procedure: LAPAROSCOPIC CHOLECYSTECTOMY;  Surgeon: Coralie Keens, MD;  [facility];  Service: General;  Laterality: N/A;  ? EXCISION/RELEASE BURSA HIP  07/26/2011  ? Procedure: EXCISION/RELEASE BURSA HIP;  Surgeon: Gearlean Alf, MD;  [facility];  Service: Orthopedics;  Laterality: Left;  Left Hip Bursectomy with Gluteal Tendon Repair  ? EYE SURGERY    ? bilateral cataracts  ? REVERSE SHOULDER ARTHROPLASTY Right 11/18/2020  ? Procedure: RIGHT REVERSE SHOULDER ARTHROPLASTY;  Surgeon: Meredith Pel, MD;  Location: [place];  Service: Orthopedics;  Laterality: Right;  ? ROTATOR CUFF REPAIR    ? bilateral   ? TONSILLECTOMY    ? TRANSTHORACIC ECHOCARDIOGRAM  12/2020  ? EF 60 to 65%.  No R WMA.  Moderate LVH worse in the basal septal region.  GR 1 DD.  Mild to moderate LA dilation.  Normal RV size and function.  Normal RAP.  Mild MVP (most notable posterior).  Mild MR.  Aortic valve sclerosis with no stenosis.  ? vaginal wall repair     ? ZIO PATCH MONITOR  12/2020  ? Mostly sinus rhythm (rate range 50-97 bpm, average 67).  Rare PACs.  Frequent (19.9%) PVCs, frequent (6%) PVC couplets with prolonged spells of ventricular bigeminy or trigeminy.  369  4-5 beat runs of PVCs (fastest 180 bpm, 4 beats, longest 5 beats 410 bpm).  Mostly noted symptoms were PVC singles couplets, triplets and PVC  bigeminy/trigeminy.  4-5 beat runs not as frequently noted on Sx log  ? ?Allergies  ?Allergen Reactions  ? Cefdinir Hives and Other (See Comments)  ?  Myalgia ?  ? Penicillin G   ?  Other reaction(s): itching  ? Yeast-Related Products   ?  Took allergy shots for it. ?Other reaction(s): Unknown  ? Penicillins Rash and Other (See Comments)  ?   ?  ? ? ? ?Objective/Physical Exam ?Neurovascular status intact.  Skin incisions appear to be well coapted with sutures  intact. No sign of infectious process noted. No dehiscence. No active bleeding noted. Moderate edema noted to the surgical extremity. ? ?Radiographic Exam:  ?Orthopedic hardware and osteotomies sites appear to be stable with routine healing.  Good alignment of the percutaneous fixation pins right foot. ? ?Assessment: ?1. s/p hammertoe repair 3, 4 right.  5 left.. DOS: 04/28/2021 ? ? ?Plan of Care:  ?1. Patient was evaluated.  ?2.  Sutures removed ?3.  Patient may begin washing and showering and getting the foot wet ?4.  Continue postsurgical shoe to protect the percutaneous pins or a stiff sole [place] type clog that is open toe ?5.  Return to clinic in 2 weeks to remove the percutaneous pins in for follow-up x-ray ? ?Edrick Kins, DPM ?[place] ? ?Dr. Edrick Kins, DPM  ?  ?[address]                                    ?[place], [place] [address]                ?Office [phone]  ?Fax [phone] ? ? ? ? ? ?

## 2021-05-18 ENCOUNTER — Ambulatory Visit (HOSPITAL_COMMUNITY)
Admission: RE | Admit: 2021-05-18 | Discharge: 2021-05-18 | Disposition: A | Discharge status: Home / Self Care | Payer: Medicare Other | Source: Ambulatory Visit | Attending: Vascular Surgery | Admitting: Vascular Surgery

## 2021-05-18 DIAGNOSIS — M25569 Pain in unspecified knee: Secondary | ICD-10-CM | POA: Insufficient documentation

## 2021-05-18 DIAGNOSIS — M25562 Pain in left knee: Secondary | ICD-10-CM | POA: Diagnosis not present

## 2021-05-25 ENCOUNTER — Ambulatory Visit (INDEPENDENT_AMBULATORY_CARE_PROVIDER_SITE_OTHER): Payer: Medicare Other

## 2021-05-25 ENCOUNTER — Ambulatory Visit (INDEPENDENT_AMBULATORY_CARE_PROVIDER_SITE_OTHER): Payer: Medicare Other | Admitting: Podiatry

## 2021-05-25 DIAGNOSIS — Z9889 Other specified postprocedural states: Secondary | ICD-10-CM

## 2021-05-25 DIAGNOSIS — M2012 Hallux valgus (acquired), left foot: Secondary | ICD-10-CM

## 2021-05-25 DIAGNOSIS — M2011 Hallux valgus (acquired), right foot: Secondary | ICD-10-CM | POA: Diagnosis not present

## 2021-05-25 NOTE — Progress Notes (Signed)
Subjective:  Patient presents today status post hammertoe repair digits 3, 4 right foot as well as the fifth digit left foot. DOS: 04/28/2021.  Patient states that she is doing very well.  She has minimal to no pain.  She has been weightbearing in the postsurgical shoe as instructed.  No new complaints at this time  Past Medical History:  Diagnosis Date   Anemia    Anxiety    mild   Arthritis    lower back   Diabetes mellitus type 2, controlled, without complications (HCC)    GERD (gastroesophageal reflux disease)    Hyperlipidemia    Mitral valve prolapse    childhood murmur, per patient   Pneumonia    with COVID in 2021     Past Surgical History:  Procedure Laterality Date   ABDOMINAL HYSTERECTOMY     CARPAL TUNNEL RELEASE     left    CHOLECYSTECTOMY N/A 03/14/2017   Procedure: LAPAROSCOPIC CHOLECYSTECTOMY;  Surgeon: Coralie Keens, MD;  Location: [facility];  Service: General;  Laterality: N/A;   EXCISION/RELEASE BURSA HIP  07/26/2011   Procedure: EXCISION/RELEASE BURSA HIP;  Surgeon: Gearlean Alf, MD;  Location: [facility];  Service: Orthopedics;  Laterality: Left;  Left Hip Bursectomy with Gluteal Tendon Repair   EYE SURGERY     bilateral cataracts   REVERSE SHOULDER ARTHROPLASTY Right 11/18/2020   Procedure: RIGHT REVERSE SHOULDER ARTHROPLASTY;  Surgeon: Meredith Pel, MD;  Location: [place];  Service: Orthopedics;  Laterality: Right;   ROTATOR CUFF REPAIR     bilateral    TONSILLECTOMY     TRANSTHORACIC ECHOCARDIOGRAM  12/2020   EF 60 to 65%.  No R WMA.  Moderate LVH worse in the basal septal region.  GR 1 DD.  Mild to moderate LA dilation.  Normal RV size and function.  Normal RAP.  Mild MVP (most notable posterior).  Mild MR.  Aortic valve sclerosis with no stenosis.   vaginal wall repair      ZIO PATCH MONITOR  12/2020   Mostly sinus rhythm (rate range 50-97 bpm, average 67).  Rare PACs.  Frequent (19.9%) PVCs, frequent (6%) PVC couplets with prolonged spells of  ventricular bigeminy or trigeminy.  369 4-5 beat runs of PVCs (fastest 180 bpm, 4 beats, longest 5 beats 410 bpm).  Mostly noted symptoms were PVC singles couplets, triplets and PVC  bigeminy/trigeminy.  4-5 beat runs not as frequently noted on Sx log   Allergies  Allergen Reactions   Cefdinir Hives and Other (See Comments)    Myalgia    Penicillin G     Other reaction(s): itching   Yeast-Related Products     Took allergy shots for it. Other reaction(s): Unknown   Penicillins Rash and Other (See Comments)          Objective/Physical Exam Neurovascular status intact.  Skin incisions healed.  No edema noted.  No erythema.  Overall this is a well-healing surgical foot  Radiographic Exam:  Orthopedic hardware and osteotomies sites appear to be stable with routine healing.  Good alignment of the percutaneous fixation pins right foot.  Assessment: 1. s/p hammertoe repair 3, 4 right.  5 left.. DOS: 04/28/2021   Plan of Care:  1. Patient was evaluated.  2.  Percutaneous pins removed today 3.  Overall the patient is doing very well.  She may begin to transition out of the postsurgical shoe into good supportive sneakers 4.  Slowly increase to full activity no restrictions beginning  in 1 month 5.  Return to clinic as needed  Edrick Kins, DPM [facility]  Dr. Edrick Kins, DPM    [address], [place] [address]                Office [phone]  Fax [phone]

## 2021-05-26 DIAGNOSIS — H401131 Primary open-angle glaucoma, bilateral, mild stage: Secondary | ICD-10-CM | POA: Diagnosis not present

## 2021-05-31 ENCOUNTER — Telehealth: Payer: Self-pay | Admitting: Cardiology

## 2021-05-31 NOTE — Telephone Encounter (Signed)
Patient got a heart monitor in the mail. She wanted to know when she should put it on. She was told to wear it for a week. Please advise

## 2021-05-31 NOTE — Telephone Encounter (Signed)
Pt aware to wear monitor for 3 days. Aware to place monitor on by end of week. Patient verbalized understanding and agreeable to plan.

## 2021-06-01 ENCOUNTER — Encounter: Payer: Self-pay | Admitting: Vascular Surgery

## 2021-06-01 ENCOUNTER — Ambulatory Visit: Payer: Medicare Other | Admitting: Vascular Surgery

## 2021-06-01 VITALS — BP 154/64 | HR 52 | Temp 98.2°F | Resp 14 | Ht 66.0 in | Wt 171.0 lb

## 2021-06-01 DIAGNOSIS — I83813 Varicose veins of bilateral lower extremities with pain: Secondary | ICD-10-CM | POA: Diagnosis not present

## 2021-06-01 NOTE — Progress Notes (Addendum)
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  She does not have any significant varicose veins or evidence of significant chronic venous insufficiency.  We have discussed the importance of intermittent leg elevation and the proper positioning for this.  I recommended that she get some knee-high compression stockings with a gradient of 15 to 20 mmHg.  We have discussed the importance of exercise specifically walking and water aerobics.  She used to do deep water aerobics and I encouraged her to get back into that.  We also discussed the importance of avoiding prolonged sitting and standing.  She does not have any significant superficial venous reflux and therefore does not need to be considered for any venous procedures otherwise.  I be happy to see her back at any time if her symptoms progress we also discussed sclerotherapy for her telangiectasias and reticular veins but I did not think this was necessary unless they became more bothersome.  REASON FOR CONSULT:    Varicose veins.  The consult is requested by Dr. Collene Leyden.   HPI:   Rose Smith is a 81 y.o. female who is referred with varicose veins.  I have reviewed the records from the referring office.  She had been having problems with her varicose veins and swelling in her left calf.  This prompted a venous duplex scan that was done on 05/18/2021.  Those results are noted below.  The patient does have a history of hypertension, hyperlipidemia, and diabetes.  These have been under good control.  On my history the patient noticed multiple veins in both legs that she was somewhat concerned about and therefore comes for vascular consultation.  She denies any previous history of DVT.  She had no previous venous procedures.  Her only complaint has been some slight swelling in her left leg.  She did injure her left leg a while back and that seems to be the time when she  was having some swelling in the left calf.  She occasionally gets some heaviness and aching pain in her legs with prolonged sitting and standing.  She denies any history of claudication or rest pain.  Past Medical History:  Diagnosis Date   Anemia    Anxiety    mild   Arthritis    lower back   Diabetes mellitus type 2, controlled, without complications (HCC)    GERD (gastroesophageal reflux disease)    Hyperlipidemia    Mitral valve prolapse    childhood murmur, per patient   Pneumonia    with COVID in 2021    Family History  Problem Relation Age of Onset   Hypertension Mother    Dementia Mother    Other Father        enlarged heart   Heart disease Maternal Grandfather    Stroke Paternal Grandmother    Hypertension Paternal Grandmother     SOCIAL HISTORY: Social History   Tobacco Use   Smoking status: Former    Types: Cigarettes    Quit date: 01/02/1998    Years since quitting: 23.4   Smokeless tobacco: Never  Substance Use Topics   Alcohol use: Yes    Comment: occasional     Allergies  Allergen Reactions   Cefdinir Hives and Other (See Comments)    Myalgia    Penicillin G     Other reaction(s): itching   Yeast-Related Products     Took allergy shots for  it. Other reaction(s): Unknown   Penicillins Rash and Other (See Comments)         Current Outpatient Medications  Medication Sig Dispense Refill   acetaminophen (TYLENOL) 650 MG CR tablet Take 1,300 mg by mouth every 8 (eight) hours as needed for pain.     aspirin EC 81 MG EC tablet Take 1 tablet (81 mg total) by mouth daily. Swallow whole. 30 tablet 11   atorvastatin (LIPITOR) 20 MG tablet Take 20 mg by mouth every other day.     B Complex-C (B-COMPLEX WITH VITAMIN C) tablet Take 1 tablet by mouth 2 (two) times daily.     buPROPion (WELLBUTRIN XL) 300 MG 24 hr tablet Take 300 mg by mouth every morning.     calcium-vitamin D (OSCAL WITH D) 500-200 MG-UNIT tablet Take 1 tablet by mouth daily.      CINNAMON PO Take 2.5 mLs by mouth daily.     diclofenac sodium (VOLTAREN) 1 % GEL Apply 2 g topically daily as needed (pain).     empagliflozin (JARDIANCE) 10 MG TABS tablet Take 10 mg by mouth daily.     flecainide (TAMBOCOR) 100 MG tablet Take 1 tablet (100 mg total) by mouth 2 (two) times daily. 180 tablet 3   gemfibrozil (LOPID) 600 MG tablet Take 600 mg by mouth 2 (two) times daily before a meal.      Ginkgo Biloba 120 MG CAPS Take 120 mg by mouth daily.     GLUCOMANNAN PO Take 200 mg by mouth at bedtime.     latanoprost (XALATAN) 0.005 % ophthalmic solution Place 1 drop into both eyes at bedtime.     MAGNESIUM CITRATE PO Take 250 mg by mouth daily.     Melatonin 5 MG CAPS Take 5 mg by mouth at bedtime.     mirabegron ER (MYRBETRIQ) 25 MG TB24 tablet Take 25 mg by mouth every other day.     omeprazole (PRILOSEC) 40 MG capsule Take 40 mg by mouth daily.      polyethylene glycol (MIRALAX / GLYCOLAX) packet Take 17 g by mouth daily.     pregabalin (LYRICA) 75 MG capsule Take 75 mg by mouth 2 (two) times daily.     Vitamin D, Ergocalciferol, (DRISDOL) 1.25 MG (50000 UNIT) CAPS capsule Take 50,000 Units by mouth once a week.     meloxicam (MOBIC) 15 MG tablet Take 1 tablet (15 mg total) by mouth daily. 30 tablet 1   oxyCODONE-acetaminophen (PERCOCET) 5-325 MG tablet Take 1 tablet by mouth every 4 (four) hours as needed for severe pain. 30 tablet 0   No current facility-administered medications for this visit.    REVIEW OF SYSTEMS:  '[X]'$  denotes positive finding, '[ ]'$  denotes negative finding Cardiac  Comments:  Chest pain or chest pressure:    Shortness of breath upon exertion: x   Short of breath when lying flat:    Irregular heart rhythm: x       Vascular    Pain in calf, thigh, or hip brought on by ambulation:    Pain in feet at night that wakes you up from your sleep:     Blood clot in your veins:    Leg swelling:  x       Pulmonary    Oxygen at home:    Productive cough:      Wheezing:         Neurologic    Sudden weakness in arms or legs:  Sudden numbness in arms or legs:     Sudden onset of difficulty speaking or slurred speech:    Temporary loss of vision in one eye:     Problems with dizziness:         Gastrointestinal    Blood in stool:     Vomited blood:         Genitourinary    Burning when urinating:     Blood in urine:        Psychiatric    Major depression:         Hematologic    Bleeding problems:    Problems with blood clotting too easily:        Skin    Rashes or ulcers:        Constitutional    Fever or chills:    -  PHYSICAL EXAM:   Vitals:   06/01/21 1530  BP: (!) 154/64  Pulse: (!) 52  Resp: 14  Temp: 98.2 F (36.8 C)  TempSrc: Temporal  SpO2: 97%  Weight: 171 lb (77.6 kg)  Height: '5\' 6"'$  (1.676 m)   Body mass index is 27.6 kg/m. GENERAL: The patient is a well-nourished female, in no acute distress. The vital signs are documented above. CARDIAC: There is a regular rate and rhythm.  VASCULAR: I do not detect carotid bruits. She has palpable dorsalis pedis and posterior tibial pulses bilaterally. She has reticular veins and telangiectasias in both lower extremities but no varicose veins.  (Varicose veins are defined as veins larger than 3 mm in diameter).   PULMONARY: There is good air exchange bilaterally without wheezing or rales. ABDOMEN: Soft and non-tender with normal pitched bowel sounds.  MUSCULOSKELETAL: There are no major deformities. NEUROLOGIC: No focal weakness or paresthesias are detected. SKIN: There are no ulcers or rashes noted. PSYCHIATRIC: The patient has a normal affect.  DATA:    VENOUS DUPLEX: I have reviewed her venous duplex scan that was done on 05/18/2021.  This was of the left lower extremity only.  There was no evidence of DVT.  There was no evidence of deep venous reflux.  There was minimal superficial venous reflux.  This involved the saphenofemoral junction on the left and  the saphenous popliteal junction on the left.  Deitra Mayo [facility]

## 2021-06-07 DIAGNOSIS — I493 Ventricular premature depolarization: Secondary | ICD-10-CM | POA: Diagnosis not present

## 2021-06-08 ENCOUNTER — Telehealth: Payer: Self-pay | Admitting: Cardiology

## 2021-06-08 NOTE — Telephone Encounter (Signed)
[employer] is calling to report an abnormal cardiac monitor

## 2021-06-08 NOTE — Telephone Encounter (Signed)
Spoke with Raquel Sarna from [place] Pt had 2 runs of SVT 06/02/21 in the afternoon and episode of symptomatic bradycardia This episode occurred on 06/02/21 in the am Per pt does not remember having any symptoms at that time Billey Gosling forward to Dr Curt Bears results are in epic for review ./cy

## 2021-06-20 DIAGNOSIS — E1121 Type 2 diabetes mellitus with diabetic nephropathy: Secondary | ICD-10-CM | POA: Diagnosis not present

## 2021-06-20 DIAGNOSIS — I493 Ventricular premature depolarization: Secondary | ICD-10-CM | POA: Diagnosis not present

## 2021-06-23 ENCOUNTER — Telehealth: Payer: Self-pay | Admitting: Cardiology

## 2021-06-23 NOTE — Telephone Encounter (Signed)
Follow Up:      Patient is returning Sherri's call from yesterday.

## 2021-06-23 NOTE — Telephone Encounter (Signed)
Pt aware of monitor results and wants to discuss at office on Monday .

## 2021-06-27 ENCOUNTER — Ambulatory Visit: Payer: Medicare Other | Admitting: Cardiology

## 2021-06-27 ENCOUNTER — Encounter: Payer: Self-pay | Admitting: Cardiology

## 2021-06-27 VITALS — BP 94/58 | HR 61 | Ht 66.0 in | Wt 172.6 lb

## 2021-06-27 DIAGNOSIS — I493 Ventricular premature depolarization: Secondary | ICD-10-CM | POA: Diagnosis not present

## 2021-08-15 DIAGNOSIS — E785 Hyperlipidemia, unspecified: Secondary | ICD-10-CM | POA: Diagnosis not present

## 2021-08-15 DIAGNOSIS — M810 Age-related osteoporosis without current pathological fracture: Secondary | ICD-10-CM | POA: Diagnosis not present

## 2021-08-15 DIAGNOSIS — E1121 Type 2 diabetes mellitus with diabetic nephropathy: Secondary | ICD-10-CM | POA: Diagnosis not present

## 2021-08-15 DIAGNOSIS — Z1211 Encounter for screening for malignant neoplasm of colon: Secondary | ICD-10-CM | POA: Diagnosis not present

## 2021-08-15 DIAGNOSIS — Z0000 Encounter for general adult medical examination without abnormal findings: Secondary | ICD-10-CM | POA: Diagnosis not present

## 2021-08-15 DIAGNOSIS — I493 Ventricular premature depolarization: Secondary | ICD-10-CM | POA: Diagnosis not present

## 2021-08-18 DIAGNOSIS — B078 Other viral warts: Secondary | ICD-10-CM | POA: Diagnosis not present

## 2021-08-18 DIAGNOSIS — L538 Other specified erythematous conditions: Secondary | ICD-10-CM | POA: Diagnosis not present

## 2021-08-18 DIAGNOSIS — L853 Xerosis cutis: Secondary | ICD-10-CM | POA: Diagnosis not present

## 2021-08-18 DIAGNOSIS — L720 Epidermal cyst: Secondary | ICD-10-CM | POA: Diagnosis not present

## 2021-08-18 DIAGNOSIS — R208 Other disturbances of skin sensation: Secondary | ICD-10-CM | POA: Diagnosis not present

## 2021-08-18 DIAGNOSIS — L448 Other specified papulosquamous disorders: Secondary | ICD-10-CM | POA: Diagnosis not present

## 2021-08-18 DIAGNOSIS — R233 Spontaneous ecchymoses: Secondary | ICD-10-CM | POA: Diagnosis not present

## 2021-08-22 DIAGNOSIS — M85851 Other specified disorders of bone density and structure, right thigh: Secondary | ICD-10-CM | POA: Diagnosis not present

## 2021-08-22 DIAGNOSIS — M85852 Other specified disorders of bone density and structure, left thigh: Secondary | ICD-10-CM | POA: Diagnosis not present

## 2021-09-07 ENCOUNTER — Ambulatory Visit: Payer: Medicare Other | Admitting: Orthopedic Surgery

## 2021-09-12 DIAGNOSIS — M8588 Other specified disorders of bone density and structure, other site: Secondary | ICD-10-CM | POA: Diagnosis not present

## 2021-09-12 DIAGNOSIS — I493 Ventricular premature depolarization: Secondary | ICD-10-CM | POA: Diagnosis not present

## 2021-09-12 DIAGNOSIS — R42 Dizziness and giddiness: Secondary | ICD-10-CM | POA: Diagnosis not present

## 2021-09-12 DIAGNOSIS — E1121 Type 2 diabetes mellitus with diabetic nephropathy: Secondary | ICD-10-CM | POA: Diagnosis not present

## 2021-09-12 DIAGNOSIS — Z23 Encounter for immunization: Secondary | ICD-10-CM | POA: Diagnosis not present

## 2021-09-14 NOTE — Progress Notes (Signed)
PCP:  Kelton Pillar, MD Primary Cardiologist: Glenetta Hew, MD Electrophysiologist: Will Meredith Leeds, MD   Rose Smith is a 81 y.o. female seen today for Will Meredith Leeds, MD for routine electrophysiology followup. Since last being seen in our clinic the patient reports doing OK. She has had more palpitations over the past several weeks, along with some dizziness. She initially declined the BB because she was relatively asymptomatic. She describes tachy-palpitations associated with flushing and occasional dizziness. No syncope. Hard palpitations at times as well.  She is getting ready to travel to [place] for 10 days and wanted to be checked out before she left.   Past Medical History:  Diagnosis Date   Anemia    Anxiety    mild   Arthritis    lower back   Diabetes mellitus type 2, controlled, without complications (HCC)    GERD (gastroesophageal reflux disease)    Hyperlipidemia    Mitral valve prolapse    childhood murmur, per patient   Pneumonia    with COVID in 2021   Past Surgical History:  Procedure Laterality Date   ABDOMINAL HYSTERECTOMY     CARPAL TUNNEL RELEASE     left    CHOLECYSTECTOMY N/A 03/14/2017   Procedure: LAPAROSCOPIC CHOLECYSTECTOMY;  Surgeon: Coralie Keens, MD;  Location: [facility];  Service: General;  Laterality: N/A;   EXCISION/RELEASE BURSA HIP  07/26/2011   Procedure: EXCISION/RELEASE BURSA HIP;  Surgeon: Gearlean Alf, MD;  Location: [facility];  Service: Orthopedics;  Laterality: Left;  Left Hip Bursectomy with Gluteal Tendon Repair   EYE SURGERY     bilateral cataracts   REVERSE SHOULDER ARTHROPLASTY Right 11/18/2020   Procedure: RIGHT REVERSE SHOULDER ARTHROPLASTY;  Surgeon: Meredith Pel, MD;  Location: [place];  Service: Orthopedics;  Laterality: Right;   ROTATOR CUFF REPAIR     bilateral    TONSILLECTOMY     TRANSTHORACIC ECHOCARDIOGRAM  12/2020   EF 60 to 65%.  No R WMA.  Moderate LVH worse in the basal septal region.  GR  1 DD.  Mild to moderate LA dilation.  Normal RV size and function.  Normal RAP.  Mild MVP (most notable posterior).  Mild MR.  Aortic valve sclerosis with no stenosis.   vaginal wall repair      ZIO PATCH MONITOR  12/2020   Mostly sinus rhythm (rate range 50-97 bpm, average 67).  Rare PACs.  Frequent (19.9%) PVCs, frequent (6%) PVC couplets with prolonged spells of ventricular bigeminy or trigeminy.  369 4-5 beat runs of PVCs (fastest 180 bpm, 4 beats, longest 5 beats 410 bpm).  Mostly noted symptoms were PVC singles couplets, triplets and PVC  bigeminy/trigeminy.  4-5 beat runs not as frequently noted on Sx log    Current Outpatient Medications  Medication Sig Dispense Refill   acetaminophen (TYLENOL) 650 MG CR tablet Take 1,300 mg by mouth every 8 (eight) hours as needed for pain.     aspirin EC 81 MG EC tablet Take 1 tablet (81 mg total) by mouth daily. Swallow whole. 30 tablet 11   atorvastatin (LIPITOR) 20 MG tablet Take 20 mg by mouth every other day.     B Complex-C (B-COMPLEX WITH VITAMIN C) tablet Take 1 tablet by mouth 2 (two) times daily.     buPROPion (WELLBUTRIN XL) 300 MG 24 hr tablet Take 300 mg by mouth every morning.     calcium-vitamin D (OSCAL WITH D) 500-200 MG-UNIT tablet Take 1 tablet by mouth  daily.     CINNAMON PO Take 2.5 mLs by mouth daily.     diclofenac sodium (VOLTAREN) 1 % GEL Apply 2 g topically daily as needed (pain).     empagliflozin (JARDIANCE) 10 MG TABS tablet Take 10 mg by mouth daily.     flecainide (TAMBOCOR) 100 MG tablet Take 1 tablet (100 mg total) by mouth 2 (two) times daily. 180 tablet 3   gemfibrozil (LOPID) 600 MG tablet Take 600 mg by mouth 2 (two) times daily before a meal.      Ginkgo Biloba 120 MG CAPS Take 120 mg by mouth daily.     GLUCOMANNAN PO Take 200 mg by mouth at bedtime.     latanoprost (XALATAN) 0.005 % ophthalmic solution Place 1 drop into both eyes at bedtime.     MAGNESIUM CITRATE PO Take 250 mg by mouth daily.     Melatonin 5  MG CAPS Take 5 mg by mouth at bedtime.     meloxicam (MOBIC) 15 MG tablet Take 1 tablet (15 mg total) by mouth daily. 30 tablet 1   mirabegron ER (MYRBETRIQ) 25 MG TB24 tablet Take 25 mg by mouth every other day.     omeprazole (PRILOSEC) 40 MG capsule Take 40 mg by mouth daily.      oxyCODONE-acetaminophen (PERCOCET) 5-325 MG tablet Take 1 tablet by mouth every 4 (four) hours as needed for severe pain. 30 tablet 0   polyethylene glycol (MIRALAX / GLYCOLAX) packet Take 17 g by mouth daily.     pregabalin (LYRICA) 75 MG capsule Take 75 mg by mouth 2 (two) times daily.     Vitamin D, Ergocalciferol, (DRISDOL) 1.25 MG (50000 UNIT) CAPS capsule Take 50,000 Units by mouth once a week.     No current facility-administered medications for this visit.    Allergies  Allergen Reactions   Cefdinir Hives and Other (See Comments)    Myalgia    Penicillin G     Other reaction(s): itching   Yeast-Related Products     Took allergy shots for it. Other reaction(s): Unknown   Penicillins Rash and Other (See Comments)         Social History   Socioeconomic History   Marital status: Married    Spouse name: Not on file   Number of children: Not on file   Years of education: Not on file   Highest education level: Not on file  Occupational History   Not on file  Tobacco Use   Smoking status: Former    Types: Cigarettes    Quit date: 01/02/1998    Years since quitting: 23.7   Smokeless tobacco: Never  Vaping Use   Vaping Use: Never used  Substance and Sexual Activity   Alcohol use: Yes    Comment: occasional    Drug use: No   Sexual activity: Not on file  Other Topics Concern   Not on file  Social History Narrative   Not on file   Social Determinants of Health   Financial Resource Strain: Not on file  Food Insecurity: Not on file  Transportation Needs: Not on file  Physical Activity: Not on file  Stress: Not on file  Social Connections: Not on file  Intimate Partner Violence: Not  on file     Review of Systems: All other systems reviewed and are otherwise negative except as noted above.  Physical Exam: There were no vitals filed for this visit.  GEN- The patient is well appearing, alert  and oriented x 3 today.   HEENT: normocephalic, atraumatic; sclera clear, conjunctiva pink; hearing intact; oropharynx clear; neck supple, no JVP Lymph- no cervical lymphadenopathy Lungs- Clear to ausculation bilaterally, normal work of breathing.  No wheezes, rales, rhonchi Heart- Regular rate and rhythm, no murmurs, rubs or gallops, PMI not laterally displaced GI- soft, non-tender, non-distended, bowel sounds present, no hepatosplenomegaly Extremities- No peripheral edema. no clubbing or cyanosis; DP/PT/radial pulses 2+ bilaterally MS- no significant deformity or atrophy Skin- warm and dry, no rash or lesion Psych- euthymic mood, full affect Neuro- strength and sensation are intact  EKG is not ordered. Personal review of EKG from 06/27/2021 shows NSR at 61 bpm with stable intervals  Additional studies reviewed include: Previous EP office notes.   Monitor 06/2021  Predominant rhythm was sinus rhythm 1.2% supraventricular ectopy 4.6% ventricular ectopy 2 runs of SVT, up to 2 hours and 26 minutes with an average heart rate of 117 bpm Triggered episodes associated with sinus bradycardia and intermittent PVCs  Assessment and Plan:  1. PVCs <5% on monitor Continue flecainide  2. SVT Rare on monitor Symptoms consistent with recurrence with tachy-palpitations and flushing.  Will start toprol 12.5 cautiously with baseline and nocturnal bradycardia.  We discussed possibility of need for pacer in the future if develops a tachy-brady picture.  Also briefly discussed loop recorder if she continues to have chronic issues managing her arrhythmia/ectopy which could be from several different sources.    Follow up with Dr. Curt Bears in 3 months   Shirley Friar, [place]   09/14/21 8:53 AM

## 2021-09-15 ENCOUNTER — Ambulatory Visit: Payer: Medicare Other | Attending: Student | Admitting: Student

## 2021-09-15 ENCOUNTER — Encounter: Payer: Self-pay | Admitting: Student

## 2021-09-15 VITALS — BP 100/58 | HR 58 | Ht 66.5 in | Wt 166.0 lb

## 2021-09-15 DIAGNOSIS — M810 Age-related osteoporosis without current pathological fracture: Secondary | ICD-10-CM | POA: Diagnosis not present

## 2021-09-15 DIAGNOSIS — M1990 Unspecified osteoarthritis, unspecified site: Secondary | ICD-10-CM | POA: Diagnosis not present

## 2021-09-15 DIAGNOSIS — I493 Ventricular premature depolarization: Secondary | ICD-10-CM | POA: Diagnosis not present

## 2021-09-15 DIAGNOSIS — E1121 Type 2 diabetes mellitus with diabetic nephropathy: Secondary | ICD-10-CM | POA: Diagnosis not present

## 2021-09-15 DIAGNOSIS — I471 Supraventricular tachycardia: Secondary | ICD-10-CM

## 2021-09-15 DIAGNOSIS — E785 Hyperlipidemia, unspecified: Secondary | ICD-10-CM | POA: Diagnosis not present

## 2021-09-15 MED ORDER — METOPROLOL SUCCINATE ER 25 MG PO TB24: 12.5000 mg | ORAL_TABLET | Freq: Every day | ORAL | 3 refills | Status: DC

## 2021-09-15 NOTE — Patient Instructions (Signed)
Medication Instructions:  Your physician has recommended you make the following change in your medication:   START: Metoprolol Succinate 12.'5mg'$  daily  *If you need a refill on your cardiac medications before your next appointment, please call your pharmacy*   Lab Work: None If you have labs (blood work) drawn today and your tests are completely normal, you will receive your results only by: [place] (if you have MyChart) OR A paper copy in the mail If you have any lab test that is abnormal or we need to change your treatment, we will call you to review the results.  Follow-Up: At [facility], you and your health needs are our priority.  As part of our continuing mission to provide you with exceptional heart care, we have created designated Provider Care Teams.  These Care Teams include your primary Cardiologist (physician) and Advanced Practice Providers (APPs -  Physician Assistants and Nurse Practitioners) who all work together to provide you with the care you need, when you need it.   Your next appointment:   2-3 month(s)  The format for your next appointment:   In Person  Provider:   Allegra Lai, MD

## 2021-10-03 ENCOUNTER — Ambulatory Visit: Payer: Medicare Other | Admitting: Podiatry

## 2021-10-05 ENCOUNTER — Emergency Department (HOSPITAL_COMMUNITY): Payer: Medicare Other

## 2021-10-05 ENCOUNTER — Emergency Department (HOSPITAL_COMMUNITY)
Admission: EM | Admit: 2021-10-05 | Discharge: 2021-10-05 | Disposition: A | Discharge status: Home / Self Care | Payer: Medicare Other | Attending: Emergency Medicine | Admitting: Emergency Medicine

## 2021-10-05 ENCOUNTER — Encounter (HOSPITAL_COMMUNITY): Payer: Self-pay | Admitting: Emergency Medicine

## 2021-10-05 ENCOUNTER — Other Ambulatory Visit: Payer: Self-pay

## 2021-10-05 DIAGNOSIS — S0101XA Laceration without foreign body of scalp, initial encounter: Secondary | ICD-10-CM

## 2021-10-05 DIAGNOSIS — R519 Headache, unspecified: Secondary | ICD-10-CM | POA: Diagnosis not present

## 2021-10-05 DIAGNOSIS — R001 Bradycardia, unspecified: Secondary | ICD-10-CM | POA: Diagnosis not present

## 2021-10-05 DIAGNOSIS — M25521 Pain in right elbow: Secondary | ICD-10-CM | POA: Diagnosis not present

## 2021-10-05 DIAGNOSIS — S5001XA Contusion of right elbow, initial encounter: Secondary | ICD-10-CM | POA: Diagnosis not present

## 2021-10-05 DIAGNOSIS — W108XXA Fall (on) (from) other stairs and steps, initial encounter: Secondary | ICD-10-CM | POA: Insufficient documentation

## 2021-10-05 DIAGNOSIS — Z7982 Long term (current) use of aspirin: Secondary | ICD-10-CM | POA: Diagnosis not present

## 2021-10-05 DIAGNOSIS — S0990XA Unspecified injury of head, initial encounter: Secondary | ICD-10-CM | POA: Diagnosis not present

## 2021-10-05 DIAGNOSIS — M4802 Spinal stenosis, cervical region: Secondary | ICD-10-CM | POA: Diagnosis not present

## 2021-10-05 DIAGNOSIS — S0003XA Contusion of scalp, initial encounter: Secondary | ICD-10-CM | POA: Diagnosis not present

## 2021-10-05 DIAGNOSIS — W19XXXA Unspecified fall, initial encounter: Secondary | ICD-10-CM

## 2021-10-05 LAB — I-STAT CHEM 8, ED
BUN: 20 mg/dL (ref 8–23)
Calcium, Ion: 1.13 mmol/L — ABNORMAL LOW (ref 1.15–1.40)
Chloride: 104 mmol/L (ref 98–111)
Creatinine, Ser: 1.1 mg/dL — ABNORMAL HIGH (ref 0.44–1.00)
Glucose, Bld: 89 mg/dL (ref 70–99)
HCT: 37 % (ref 36.0–46.0)
Hemoglobin: 12.6 g/dL (ref 12.0–15.0)
Potassium: 3.9 mmol/L (ref 3.5–5.1)
Sodium: 136 mmol/L (ref 135–145)
TCO2: 20 mmol/L — ABNORMAL LOW (ref 22–32)

## 2021-10-05 MED ORDER — LIDOCAINE-EPINEPHRINE (PF) 2 %-1:200000 IJ SOLN
10.0000 mL | Freq: Once | INTRAMUSCULAR | Status: AC
  Administered 2021-10-05: 3 mL via INTRADERMAL
  Filled 2021-10-05: qty 20

## 2021-10-05 MED ORDER — ACETAMINOPHEN 500 MG PO TABS
1000.0000 mg | ORAL_TABLET | Freq: Once | ORAL | Status: AC
  Administered 2021-10-05: 1000 mg via ORAL
  Filled 2021-10-05: qty 2

## 2021-10-05 MED ORDER — ONDANSETRON HCL 4 MG PO TABS: 4.0000 mg | ORAL_TABLET | Freq: Three times a day (TID) | ORAL | 0 refills | Status: DC | PRN

## 2021-10-05 MED ORDER — ACETAMINOPHEN ER 650 MG PO TBCR: 1300.0000 mg | EXTENDED_RELEASE_TABLET | Freq: Three times a day (TID) | ORAL | 0 refills | Status: AC | PRN

## 2021-10-05 NOTE — ED Provider Notes (Signed)
[facility] DEPT Provider Note   CSN: [id number] Arrival date & time: 10/05/21  1630     History  Chief Complaint  Patient presents with   Fall   Head Injury    Rose Smith is a 81 y.o. female.  The history is provided by the patient, a friend and medical records. No language interpreter was used.  Fall  Head Injury    Rose Smith is a 81 yo female with PMHx of bradycardia, PVCs, and HLD who presents to ED following a fall down the stairs. Pt states she was leaving a friend's house, and started walking down concrete stairs. She missed the first step and  fell, twisting to the right and striking her head on a rail on the way to the ground. She denies syncope, visual changes, HA, weakness, or loss of sensation to extremities. She endorses dizziness that developed while she was getting CT scan today in the ED which has waxed and waned since then. She endorses nausea related to this, but denies vomiting. She does not take blood thinning medications and has not noticed easily bruising/bleeding. She was able to walk following the fall and denies losing bowel/bladder control. She states she is UTD on tetanus vaccine.  Home Medications Prior to Admission medications   Medication Sig Start Date End Date Taking? Authorizing Provider  acetaminophen (TYLENOL) 650 MG CR tablet Take 1,300 mg by mouth every 8 (eight) hours as needed for pain.    [provider]  aspirin EC 81 MG EC tablet Take 1 tablet (81 mg total) by mouth daily. Swallow whole. 11/21/20   Meredith Pel, MD  atorvastatin (LIPITOR) 20 MG tablet Take 20 mg by mouth every other day. 04/08/19   [provider]  B Complex-C (B-COMPLEX WITH VITAMIN C) tablet Take 1 tablet by mouth 2 (two) times daily.    [provider]  buPROPion (WELLBUTRIN XL) 300 MG 24 hr tablet Take 300 mg by mouth every morning. 04/16/19   [provider]  calcium-vitamin D (OSCAL WITH  D) 500-200 MG-UNIT tablet Take 1 tablet by mouth daily.    [provider]  CINNAMON PO Take 2.5 mLs by mouth daily.    [provider]  diclofenac sodium (VOLTAREN) 1 % GEL Apply 2 g topically daily as needed (pain).    [provider]  flecainide (TAMBOCOR) 100 MG tablet Take 1 tablet (100 mg total) by mouth 2 (two) times daily. 03/31/21   Burnell Blanks, MD  gemfibrozil (LOPID) 600 MG tablet Take 600 mg by mouth 2 (two) times daily before a meal.  05/03/13   [provider]  Ginkgo Biloba 120 MG CAPS Take 120 mg by mouth daily.    [provider]  GLUCOMANNAN PO Take 200 mg by mouth at bedtime.    [provider]  latanoprost (XALATAN) 0.005 % ophthalmic solution Place 1 drop into both eyes at bedtime.    [provider]  MAGNESIUM CITRATE PO Take 250 mg by mouth daily.    [provider]  Melatonin 5 MG CAPS Take 5 mg by mouth at bedtime.    [provider]  metoprolol succinate (TOPROL XL) 25 MG 24 hr tablet Take 0.5 tablets (12.5 mg total) by mouth daily. 09/15/21   Shirley Friar, PA-C  MOUNJARO 2.5 MG/0.5ML Pen SMARTSIG:0.5 Milliliter(s) SUB-Q Once a Week 09/12/21   [provider]  omeprazole (PRILOSEC) 40 MG capsule Take 40 mg by mouth  daily.  06/11/13   [provider]  polyethylene glycol (MIRALAX / GLYCOLAX) packet Take 17 g by mouth daily.    [provider]  pregabalin (LYRICA) 75 MG capsule Take 75 mg by mouth 2 (two) times daily.    [provider]  Vitamin D, Ergocalciferol, (DRISDOL) 1.25 MG (50000 UNIT) CAPS capsule Take 50,000 Units by mouth once a week. 04/24/19   [provider]      Allergies    Cefdinir, Penicillin g, Yeast-related products, and Penicillins    Review of Systems   Review of Systems  All other systems reviewed and are negative.   Physical Exam Updated Vital Signs BP (!) 119/59   Pulse (!) 50   Temp 98.4 F (36.9  C) (Oral)   Resp 17   SpO2 94%  Physical Exam Vitals and nursing note reviewed.  Constitutional:      General: She is not in acute distress.    Appearance: She is well-developed.  HENT:     Head: Normocephalic.     Comments: 1cm laceration to R parietal scalp with surrounding dry blood, ttp.  Eyes:     Conjunctiva/sclera: Conjunctivae normal.  Pulmonary:     Effort: Pulmonary effort is normal.  Abdominal:     Palpations: Abdomen is soft.  Musculoskeletal:        General: Tenderness (R elbow: ecchymosis noted to posterior elbow with normal flexion/extension) present.     Cervical back: Neck supple.  Skin:    Findings: No rash.  Neurological:     Mental Status: She is alert and oriented to person, place, and time.  Psychiatric:        Mood and Affect: Mood normal.     ED Results / Procedures / Treatments   Labs (all labs ordered are listed, but only abnormal results are displayed) Labs Reviewed  I-STAT CHEM 8, ED - Abnormal; Notable for the following components:      Result Value   Creatinine, Ser 1.10 (*)    Calcium, Ion 1.13 (*)    TCO2 20 (*)    All other components within normal limits  URINALYSIS, ROUTINE W REFLEX MICROSCOPIC    EKG None ED ECG REPORT   Date: 10/05/2021  Rate: 46  Rhythm: sinus bradycardia  QRS Axis: normal  Intervals: normal  ST/T Wave abnormalities: normal  Conduction Disutrbances:none  Narrative Interpretation: PVC  Old EKG Reviewed: unchanged  I have personally reviewed the EKG tracing and agree with the computerized printout as noted.   Radiology CT Head Wo Contrast  Result Date: 10/05/2021 CLINICAL DATA:  Trauma.  Fall with posterior head injury.  Headache. EXAM: CT HEAD WITHOUT CONTRAST CT CERVICAL SPINE WITHOUT CONTRAST TECHNIQUE: Multidetector CT imaging of the head and cervical spine was performed following the standard protocol without intravenous contrast. Multiplanar CT image reconstructions of the cervical spine were  also generated. RADIATION DOSE REDUCTION: This exam was performed according to the departmental dose-optimization program which includes automated exposure control, adjustment of the mA and/or kV according to patient size and/or use of iterative reconstruction technique. COMPARISON:  CT head and maxillofacial 01/17/2020. MRI cervical spine 07/01/2005. Cervical spine radiographs 10/01/2007. FINDINGS: CT HEAD FINDINGS Brain: There is no evidence of an acute infarct, intracranial hemorrhage, mass, midline shift, or extra-axial fluid collection. Patchy hypodensities in the cerebral white matter bilaterally are stable to mildly increased and are nonspecific but compatible with moderate chronic small vessel ischemic disease. Mild cerebral atrophy is within normal limits for age.  Vascular: Calcified atherosclerosis at the skull base. No hyperdense vessel. Skull: No acute fracture or suspicious osseous lesion. Sinuses/Orbits: Visualized paranasal sinuses and mastoid air cells are clear. Bilateral cataract extraction. Other: Small right parietal scalp hematoma. CT CERVICAL SPINE FINDINGS Alignment: Mild reversal of the normal cervical lordosis. Trace anterolisthesis of C3 on C4, degenerative in appearance. Mild left convex curvature of the cervical spine. Skull base and vertebrae: No acute fracture or suspicious osseous lesion. Prominent median C1-2 arthropathy. Interbody and facet ankylosis at C5-6. Soft tissues and spinal canal: No prevertebral fluid or swelling. No visible canal hematoma. Disc levels: Widespread, advanced facet arthrosis in the cervical and included upper thoracic spine. Mild-to-moderate disc degeneration at C3-4, C6-7, and C7-T1. Severe right neural foraminal stenosis at C3-4 and moderate right neural foraminal stenosis at C6-7 due to uncovertebral and facet spurring. Upper chest: Clear lung apices. Other: Mild atherosclerotic calcification of the carotid bifurcations. IMPRESSION: 1. No evidence of acute  intracranial abnormality. 2. Small right parietal scalp hematoma. 3. Moderate chronic small vessel ischemic disease. 4. No acute cervical spine fracture. Multilevel disc degeneration and advanced facet arthrosis. Electronically Signed   By: Logan Bores M.D.   On: 10/05/2021 18:17   CT Cervical Spine Wo Contrast  Result Date: 10/05/2021 CLINICAL DATA:  Trauma.  Fall with posterior head injury.  Headache. EXAM: CT HEAD WITHOUT CONTRAST CT CERVICAL SPINE WITHOUT CONTRAST TECHNIQUE: Multidetector CT imaging of the head and cervical spine was performed following the standard protocol without intravenous contrast. Multiplanar CT image reconstructions of the cervical spine were also generated. RADIATION DOSE REDUCTION: This exam was performed according to the departmental dose-optimization program which includes automated exposure control, adjustment of the mA and/or kV according to patient size and/or use of iterative reconstruction technique. COMPARISON:  CT head and maxillofacial 01/17/2020. MRI cervical spine 07/01/2005. Cervical spine radiographs 10/01/2007. FINDINGS: CT HEAD FINDINGS Brain: There is no evidence of an acute infarct, intracranial hemorrhage, mass, midline shift, or extra-axial fluid collection. Patchy hypodensities in the cerebral white matter bilaterally are stable to mildly increased and are nonspecific but compatible with moderate chronic small vessel ischemic disease. Mild cerebral atrophy is within normal limits for age. Vascular: Calcified atherosclerosis at the skull base. No hyperdense vessel. Skull: No acute fracture or suspicious osseous lesion. Sinuses/Orbits: Visualized paranasal sinuses and mastoid air cells are clear. Bilateral cataract extraction. Other: Small right parietal scalp hematoma. CT CERVICAL SPINE FINDINGS Alignment: Mild reversal of the normal cervical lordosis. Trace anterolisthesis of C3 on C4, degenerative in appearance. Mild left convex curvature of the cervical  spine. Skull base and vertebrae: No acute fracture or suspicious osseous lesion. Prominent median C1-2 arthropathy. Interbody and facet ankylosis at C5-6. Soft tissues and spinal canal: No prevertebral fluid or swelling. No visible canal hematoma. Disc levels: Widespread, advanced facet arthrosis in the cervical and included upper thoracic spine. Mild-to-moderate disc degeneration at C3-4, C6-7, and C7-T1. Severe right neural foraminal stenosis at C3-4 and moderate right neural foraminal stenosis at C6-7 due to uncovertebral and facet spurring. Upper chest: Clear lung apices. Other: Mild atherosclerotic calcification of the carotid bifurcations. IMPRESSION: 1. No evidence of acute intracranial abnormality. 2. Small right parietal scalp hematoma. 3. Moderate chronic small vessel ischemic disease. 4. No acute cervical spine fracture. Multilevel disc degeneration and advanced facet arthrosis. Electronically Signed   By: Logan Bores M.D.   On: 10/05/2021 18:17   DG Elbow Complete Right  Result Date: 10/05/2021 CLINICAL DATA:  Fall.  Right elbow pain. EXAM: RIGHT  ELBOW - COMPLETE 3+ VIEW COMPARISON:  None Available. FINDINGS: No acute fracture, dislocation, or elbow joint effusion is identified. Mild degenerative spurring is noted along the olecranon process of the ulna and radial head. Prominence of the posterior elbow soft tissues may reflect swelling or olecranon bursitis. IMPRESSION: No acute osseous abnormality. Electronically Signed   By: Logan Bores M.D.   On: 10/05/2021 17:56    Procedures .Marland KitchenLaceration Repair  Date/Time: 10/05/2021 8:15 PM  Performed by: Domenic Moras, PA-C Authorized by: Domenic Moras, PA-C   Consent:    Consent obtained:  Verbal   Consent given by:  Patient   Risks discussed:  Infection, need for additional repair, pain, poor cosmetic result and poor wound healing   Alternatives discussed:  No treatment and delayed treatment Universal protocol:    Procedure explained and  questions answered to patient or proxy's satisfaction: yes     Relevant documents present and verified: yes     Test results available: yes     Imaging studies available: yes     Required blood products, implants, devices, and special equipment available: yes     Site/side marked: yes     Immediately prior to procedure, a time out was called: yes     Patient identity confirmed:  Verbally with patient Anesthesia:    Anesthesia method:  Local infiltration   Local anesthetic:  Lidocaine 2% WITH epi Laceration details:    Location:  Scalp   Scalp location:  R parietal   Length (cm):  1   Depth (mm):  4 Pre-procedure details:    Preparation:  Patient was prepped and draped in usual sterile fashion and imaging obtained to evaluate for foreign bodies Exploration:    Limited defect created (wound extended): no     Hemostasis achieved with:  Direct pressure   Imaging outcome: foreign body not noted     Wound exploration: wound explored through full range of motion and entire depth of wound visualized     Contaminated: yes   Treatment:    Area cleansed with:  Povidone-iodine   Amount of cleaning:  Extensive   Irrigation solution:  Sterile saline   Irrigation method:  Pressure wash Skin repair:    Repair method:  Staples   Number of staples:  1 Approximation:    Approximation:  Close Repair type:    Repair type:  Simple Post-procedure details:    Dressing:  Open (no dressing)   Procedure completion:  Tolerated well, no immediate complications     Medications Ordered in ED Medications  acetaminophen (TYLENOL) tablet 1,000 mg (1,000 mg Oral Given 10/05/21 1856)  lidocaine-EPINEPHrine (XYLOCAINE W/EPI) 2 %-1:200000 (PF) injection 10 mL (3 mLs Intradermal Given by Other 10/05/21 1944)    ED Course/ Medical Decision Making/ A&P                           Medical Decision Making Amount and/or Complexity of Data Reviewed Labs: ordered. Radiology: ordered. ECG/medicine tests:  ordered.  Risk OTC drugs. Prescription drug management.   BP (!) 119/59   Pulse (!) 50   Temp 98.4 F (36.9 C) (Oral)   Resp 17   SpO2 94%   79:39 PM 81 year old female with history of frequent PVC, bradycardia, paroxysmal ventricular tachycardia, diabetes, anemia, anxiety, presenting for evaluation of a fall.  Patient tripped while walking down the steps at her friend's house and fell striking the back of her head against the ground.  Incident happened earlier today.  No loss of consciousness.  She was able to get up on her own.  She endorsed bleeding from the scalp from the fall.  She also struck her right elbow and having some pain there.  She does not complain of any precipitating symptoms prior to the fall.  She is not on any blood thinner medication.  No specific treatment tried.  She does endorse some mild dizziness.  No nausea confusion focal numbness or focal weakness or neck pain.  On exam patient has dried blood noted to right parietal scalp with mild tenderness to palpation but no crepitus.  No significant midline spine tenderness.  She has a skin tear and ecchymosis noted to her right elbow with normal flexion and extension.  Heart lung sounds normal.  Abdomen is soft nontender.  Hips stable, knees nontender.  She able to ambulate.  She is mentating appropriately.  Vital signs significant for bradycardia with heart rate of 50.  This patient presents to the ED for concern of fall, this involves an extensive number of treatment options, and is a complaint that carries with it a high risk of complications and morbidity.  The differential diagnosis includes head injury, elbow injury, brain bleed, bony fx,   Co morbidities that complicate the patient evaluation PVC Additional history obtained:  Additional history obtained from friend External records from outside source obtained and reviewed including EMR  Lab Tests:  I Ordered, and personally interpreted labs.  The pertinent  results include:  as above  Imaging Studies ordered:  I ordered imaging studies including head CT I independently visualized and interpreted imaging which showed scalp hematoma I agree with the radiologist interpretation  Cardiac Monitoring:  The patient was maintained on a cardiac monitor.  I personally viewed and interpreted the cardiac monitored which showed an underlying rhythm of: sinus brady  Medicines ordered and prescription drug management:  I ordered medication including tylenol  for headache Reevaluation of the patient after these medicines showed that the patient improved I have reviewed the patients home medicines and have made adjustments as needed  Test Considered: as above  Critical Interventions: none   Problem List / ED Course: fall  Scalp lac  R elbow contusion  Reevaluation:  After the interventions noted above, I reevaluated the patient and found that they have :improved  Social Determinants of Health: none  Dispostion:  After consideration of the diagnostic results and the patients response to treatment, I feel that the patent would benefit from outpt f/u.         Final Clinical Impression(s) / ED Diagnoses Final diagnoses:  Fall, initial encounter  Laceration of scalp, initial encounter  Contusion of right elbow, initial encounter    Rx / DC Orders ED Discharge Orders          Ordered    acetaminophen (TYLENOL) 650 MG CR tablet  Every 8 hours PRN        10/05/21 2018    ondansetron (ZOFRAN) 4 MG tablet  Every 8 hours PRN        10/05/21 2018              Domenic Moras, PA-C 10/05/21 2044    Tegeler, Gwenyth Allegra, MD 10/05/21 2320

## 2021-10-05 NOTE — ED Triage Notes (Signed)
Pt from home.  Missed a step going down stairs.  Fall and landed on right side striking her head.  Lac noted.  Bleeding controlled. No blood thinners.  Also right elbow and side pain.

## 2021-10-05 NOTE — Discharge Instructions (Addendum)
You have been evaluated for your fall.  You suffered a laceration to the scalp.  A surgical staple was placed and this will need to be removed in 5 to 7 days.  Take Tylenol as needed for aches and pain.  Take Zofran as needed for nausea.  Follow-up with your doctor for further care.

## 2021-10-05 NOTE — ED Provider Triage Note (Signed)
Emergency Medicine Provider Triage Evaluation Note  DANIAH ZALDIVAR , a 81 y.o. female  was evaluated in triage.  Pt complains of fall. Pt tripped and fell down 3 steps striking the back of her head today.  No LOC.  Pain to scalp and R elbow.  UTD with tdap, no precipitating sxs.  Not on blood thinner  Review of Systems  Positive: As above Negative: As above  Physical Exam  BP (!) 119/59   Pulse (!) 50   Temp 98.4 F (36.9 C) (Oral)   Resp 17   SpO2 94%  Gen:   Awake, no distress   Resp:  Normal effort  MSK:   Moves extremities without difficulty  Other:  Scalp lac to occiput  Medical Decision Making  Medically screening exam initiated at 5:34 PM.  Appropriate orders placed.  PALYN SCRIMA was informed that the remainder of the evaluation will be completed by another provider, this initial triage assessment does not replace that evaluation, and the importance of remaining in the ED until their evaluation is complete.     Domenic Moras, PA-C 10/05/21 1735

## 2021-10-10 ENCOUNTER — Ambulatory Visit: Payer: Medicare Other | Admitting: Orthopedic Surgery

## 2021-10-10 DIAGNOSIS — Z4802 Encounter for removal of sutures: Secondary | ICD-10-CM | POA: Diagnosis not present

## 2021-10-10 DIAGNOSIS — H401131 Primary open-angle glaucoma, bilateral, mild stage: Secondary | ICD-10-CM | POA: Diagnosis not present

## 2021-10-10 DIAGNOSIS — S0101XD Laceration without foreign body of scalp, subsequent encounter: Secondary | ICD-10-CM | POA: Diagnosis not present

## 2021-10-10 DIAGNOSIS — R42 Dizziness and giddiness: Secondary | ICD-10-CM | POA: Diagnosis not present

## 2021-10-17 ENCOUNTER — Ambulatory Visit: Payer: Medicare Other | Admitting: Podiatry

## 2021-10-17 DIAGNOSIS — M7752 Other enthesopathy of left foot: Secondary | ICD-10-CM

## 2021-10-17 MED ORDER — BETAMETHASONE SOD PHOS & ACET 6 (3-3) MG/ML IJ SUSP
3.0000 mg | Freq: Once | INTRAMUSCULAR | Status: AC
  Administered 2021-10-17: 3 mg via INTRA_ARTICULAR

## 2021-10-17 NOTE — Progress Notes (Signed)
Chief Complaint  Patient presents with   Toe Pain    Patient is here complaining of left foot pinky toe pain, she states that it curves up sometimes, patient states that she has had surgery on this foot.    Subjective:  Patient presents today status post hammertoe repair digits 3, 4 right foot as well as the fifth digit left foot. DOS: 04/28/2021.  Patient states that her left fifth digit is somewhat elevated and causes irritation in close toed shoes.  She is very satisfied with the right foot surgery and has no pain or tenderness associated to the right foot.  Presenting for further treatment and evaluation  Past Medical History:  Diagnosis Date   Anemia    Anxiety    mild   Arthritis    lower back   Diabetes mellitus type 2, controlled, without complications (HCC)    GERD (gastroesophageal reflux disease)    Hyperlipidemia    Mitral valve prolapse    childhood murmur, per patient   Pneumonia    with COVID in 2021     Past Surgical History:  Procedure Laterality Date   ABDOMINAL HYSTERECTOMY     CARPAL TUNNEL RELEASE     left    CHOLECYSTECTOMY N/A 03/14/2017   Procedure: LAPAROSCOPIC CHOLECYSTECTOMY;  Surgeon: Coralie Keens, MD;  Location: [facility];  Service: General;  Laterality: N/A;   EXCISION/RELEASE BURSA HIP  07/26/2011   Procedure: EXCISION/RELEASE BURSA HIP;  Surgeon: Gearlean Alf, MD;  Location: [facility];  Service: Orthopedics;  Laterality: Left;  Left Hip Bursectomy with Gluteal Tendon Repair   EYE SURGERY     bilateral cataracts   REVERSE SHOULDER ARTHROPLASTY Right 11/18/2020   Procedure: RIGHT REVERSE SHOULDER ARTHROPLASTY;  Surgeon: Meredith Pel, MD;  Location: [facility];  Service: Orthopedics;  Laterality: Right;   ROTATOR CUFF REPAIR     bilateral    TONSILLECTOMY     TRANSTHORACIC ECHOCARDIOGRAM  12/2020   EF 60 to 65%.  No R WMA.  Moderate LVH worse in the basal septal region.  GR 1 DD.  Mild to moderate LA dilation.  Normal RV size and function.   Normal RAP.  Mild MVP (most notable posterior).  Mild MR.  Aortic valve sclerosis with no stenosis.   vaginal wall repair      ZIO PATCH MONITOR  12/2020   Mostly sinus rhythm (rate range 50-97 bpm, average 67).  Rare PACs.  Frequent (19.9%) PVCs, frequent (6%) PVC couplets with prolonged spells of ventricular bigeminy or trigeminy.  369 4-5 beat runs of PVCs (fastest 180 bpm, 4 beats, longest 5 beats 410 bpm).  Mostly noted symptoms were PVC singles couplets, triplets and PVC  bigeminy/trigeminy.  4-5 beat runs not as frequently noted on Sx log   Allergies  Allergen Reactions   Cefdinir Hives and Other (See Comments)    Myalgia    Penicillin G     Other reaction(s): itching   Yeast-Related Products     Took allergy shots for it. Other reaction(s): Unknown   Penicillins Rash and Other (See Comments)          Objective/Physical Exam Neurovascular status intact.  Skin incisions healed.  No edema noted.  No erythema.  The third and fourth digit of the right foot is nice and rectus with no complaints or tenderness.  Patient very satisfied with the right foot The left fifth digit is slightly elevated compared to the adjacent toes.  Approximately 2 mm of  elevation compared to the other digits.  This is enough to cause some irritation to the toe.  No open wounds.  No edema noted.  Slight tenderness with palpation of the toe.  Radiographic Exam B/L feet 05/25/2021:  Orthopedic hardware and osteotomies sites appear to be stable with routine healing.  Good alignment of the percutaneous fixation pins right foot.  Assessment: 1. s/p hammertoe repair 3, 4 right.  5 left.. DOS: 04/28/2021 2.  Slightly elevated toe fifth digit left foot   Plan of Care:  1. Patient was evaluated.  2.  Injection of 0.5 cc Celestone Soluspan injected into the fifth digit left foot.  Recommend daily plantarflexion stretching exercises to break up scar tissue and help plantar flex the toe 3.  Recommend wide fitting  shoes that do not irritate the toe or constrict the toebox area 4.  Return to clinic as needed  Edrick Kins, DPM [facility]  Dr. Edrick Kins, DPM    [address], [place] [address]                Office [phone]  Fax [phone]

## 2021-10-25 DIAGNOSIS — M810 Age-related osteoporosis without current pathological fracture: Secondary | ICD-10-CM | POA: Diagnosis not present

## 2021-10-25 DIAGNOSIS — N183 Chronic kidney disease, stage 3 unspecified: Secondary | ICD-10-CM | POA: Diagnosis not present

## 2021-10-25 DIAGNOSIS — E1121 Type 2 diabetes mellitus with diabetic nephropathy: Secondary | ICD-10-CM | POA: Diagnosis not present

## 2021-10-25 DIAGNOSIS — M1990 Unspecified osteoarthritis, unspecified site: Secondary | ICD-10-CM | POA: Diagnosis not present

## 2021-10-25 DIAGNOSIS — E785 Hyperlipidemia, unspecified: Secondary | ICD-10-CM | POA: Diagnosis not present

## 2021-11-01 DIAGNOSIS — N3281 Overactive bladder: Secondary | ICD-10-CM | POA: Diagnosis not present

## 2021-11-01 DIAGNOSIS — Q6211 Congenital occlusion of ureteropelvic junction: Secondary | ICD-10-CM | POA: Diagnosis not present

## 2021-11-08 ENCOUNTER — Other Ambulatory Visit: Payer: Self-pay

## 2021-11-08 MED ORDER — FLECAINIDE ACETATE 100 MG PO TABS: 100.0000 mg | ORAL_TABLET | Freq: Two times a day (BID) | ORAL | 3 refills | Status: DC

## 2021-11-08 MED ORDER — METOPROLOL SUCCINATE ER 25 MG PO TB24: 12.5000 mg | ORAL_TABLET | Freq: Every day | ORAL | 3 refills | Status: DC

## 2021-11-08 NOTE — Telephone Encounter (Signed)
Pt's medications were sent to pt's pharmacy as requested. Confirmation received.  

## 2021-11-16 ENCOUNTER — Encounter: Payer: Self-pay | Admitting: Cardiology

## 2021-11-16 ENCOUNTER — Ambulatory Visit: Payer: Medicare Other | Attending: Cardiology | Admitting: Cardiology

## 2021-11-16 VITALS — BP 136/82 | HR 47 | Ht 66.5 in | Wt 164.6 lb

## 2021-11-16 DIAGNOSIS — I471 Supraventricular tachycardia, unspecified: Secondary | ICD-10-CM | POA: Diagnosis not present

## 2021-11-16 DIAGNOSIS — J0100 Acute maxillary sinusitis, unspecified: Secondary | ICD-10-CM | POA: Diagnosis not present

## 2021-11-16 DIAGNOSIS — I493 Ventricular premature depolarization: Secondary | ICD-10-CM | POA: Diagnosis not present

## 2021-11-16 NOTE — Progress Notes (Signed)
Electrophysiology Office Note   Date:  11/16/2021   ID:  Rose Smith, DOB 11-Jun-1940, MRN [id number]  PCP:  Collene Leyden, MD  Cardiologist:  Ellyn Hack Primary Electrophysiologist:  Adrion Menz Meredith Leeds, MD    Chief Complaint: PVC   History of Present Illness: Rose Smith is a 81 y.o. female who is being seen today for the evaluation of PVC at the request of Kelton Pillar, MD. Presenting today for electrophysiology evaluation.  She has a history significant hyperlipidemia, type 2 diabetes, mitral valve prolapse.  She wore a cardiac monitor that showed an elevated PVC burden.  She was started on flecainide.  She wore a repeat monitor that showed a burden of 4%.  Today, denies symptoms of palpitations, chest pain, shortness of breath, orthopnea, PND, lower extremity edema, claudication, dizziness, presyncope, syncope, bleeding, or neurologic sequela. The patient is tolerating medications without difficulties. She is feeling well. She has no further palpitations since her metoprolol.    Past Medical History:  Diagnosis Date   Anemia    Anxiety    mild   Arthritis    lower back   Diabetes mellitus type 2, controlled, without complications (HCC)    GERD (gastroesophageal reflux disease)    Hyperlipidemia    Mitral valve prolapse    childhood murmur, per patient   Pneumonia    with COVID in 2021   Past Surgical History:  Procedure Laterality Date   ABDOMINAL HYSTERECTOMY     CARPAL TUNNEL RELEASE     left    CHOLECYSTECTOMY N/A 03/14/2017   Procedure: LAPAROSCOPIC CHOLECYSTECTOMY;  Surgeon: Coralie Keens, MD;  Location: [facility];  Service: General;  Laterality: N/A;   EXCISION/RELEASE BURSA HIP  07/26/2011   Procedure: EXCISION/RELEASE BURSA HIP;  Surgeon: Gearlean Alf, MD;  Location: [facility];  Service: Orthopedics;  Laterality: Left;  Left Hip Bursectomy with Gluteal Tendon Repair   EYE SURGERY     bilateral cataracts   REVERSE SHOULDER ARTHROPLASTY Right  11/18/2020   Procedure: RIGHT REVERSE SHOULDER ARTHROPLASTY;  Surgeon: Meredith Pel, MD;  Location: [facility];  Service: Orthopedics;  Laterality: Right;   ROTATOR CUFF REPAIR     bilateral    TONSILLECTOMY     TRANSTHORACIC ECHOCARDIOGRAM  12/2020   EF 60 to 65%.  No R WMA.  Moderate LVH worse in the basal septal region.  GR 1 DD.  Mild to moderate LA dilation.  Normal RV size and function.  Normal RAP.  Mild MVP (most notable posterior).  Mild MR.  Aortic valve sclerosis with no stenosis.   vaginal wall repair      ZIO PATCH MONITOR  12/2020   Mostly sinus rhythm (rate range 50-97 bpm, average 67).  Rare PACs.  Frequent (19.9%) PVCs, frequent (6%) PVC couplets with prolonged spells of ventricular bigeminy or trigeminy.  369 4-5 beat runs of PVCs (fastest 180 bpm, 4 beats, longest 5 beats 410 bpm).  Mostly noted symptoms were PVC singles couplets, triplets and PVC  bigeminy/trigeminy.  4-5 beat runs not as frequently noted on Sx log     Current Outpatient Medications  Medication Sig Dispense Refill   acetaminophen (TYLENOL) 650 MG CR tablet Take 2 tablets (1,300 mg total) by mouth every 8 (eight) hours as needed for pain. 30 tablet 0   aspirin EC 81 MG EC tablet Take 1 tablet (81 mg total) by mouth daily. Swallow whole. 30 tablet 11   atorvastatin (LIPITOR) 20 MG tablet Take 20 mg by  mouth every other day.     B Complex-C (B-COMPLEX WITH VITAMIN C) tablet Take 1 tablet by mouth 2 (two) times daily.     buPROPion (WELLBUTRIN XL) 300 MG 24 hr tablet Take 300 mg by mouth every morning.     calcium-vitamin D (OSCAL WITH D) 500-200 MG-UNIT tablet Take 1 tablet by mouth daily.     CINNAMON PO Take 2.5 mLs by mouth daily.     diclofenac sodium (VOLTAREN) 1 % GEL Apply 2 g topically daily as needed (pain).     flecainide (TAMBOCOR) 100 MG tablet Take 1 tablet (100 mg total) by mouth 2 (two) times daily. 180 tablet 3   gemfibrozil (LOPID) 600 MG tablet Take 600 mg by mouth 2 (two) times daily  before a meal.      Ginkgo Biloba 120 MG CAPS Take 120 mg by mouth daily.     GLUCOMANNAN PO Take 200 mg by mouth at bedtime.     latanoprost (XALATAN) 0.005 % ophthalmic solution Place 1 drop into both eyes at bedtime.     MAGNESIUM CITRATE PO Take 250 mg by mouth daily.     Melatonin 5 MG CAPS Take 5 mg by mouth at bedtime.     metoprolol succinate (TOPROL XL) 25 MG 24 hr tablet Take 0.5 tablets (12.5 mg total) by mouth daily. 45 tablet 3   MOUNJARO 2.5 MG/0.5ML Pen SMARTSIG:0.5 Milliliter(s) SUB-Q Once a Week     omeprazole (PRILOSEC) 40 MG capsule Take 40 mg by mouth daily.      ondansetron (ZOFRAN) 4 MG tablet Take 1 tablet (4 mg total) by mouth every 8 (eight) hours as needed for nausea or vomiting. 12 tablet 0   polyethylene glycol (MIRALAX / GLYCOLAX) packet Take 17 g by mouth daily.     pregabalin (LYRICA) 75 MG capsule Take 75 mg by mouth 2 (two) times daily.     Semaglutide (OZEMPIC, 0.25 OR 0.5 MG/DOSE, [place]) Inject 0.5 mg into the skin.     Vitamin D, Ergocalciferol, (DRISDOL) 1.25 MG (50000 UNIT) CAPS capsule Take 50,000 Units by mouth once a week.     No current facility-administered medications for this visit.    Allergies:   Cefdinir, Penicillin g, Yeast-related products, and Penicillins   Social History:  The patient  reports that she quit smoking about 23 years ago. Her smoking use included cigarettes. She has never used smokeless tobacco. She reports current alcohol use. She reports that she does not use drugs.   Family History:  The patient's family history includes Dementia in her mother; Heart disease in her maternal grandfather; Hypertension in her mother and paternal grandmother; Other in her father; Stroke in her paternal grandmother.   ROS:  Please see the history of present illness.   Otherwise, review of systems is positive for none.   All other systems are reviewed and negative.   PHYSICAL EXAM: VS:  BP 136/82   Pulse (!) 47   Ht 5' 6.5" (1.689 m)   Wt 164 lb  9.6 oz (74.7 kg)   SpO2 96%   BMI 26.17 kg/m  , BMI Body mass index is 26.17 kg/m. GEN: Well nourished, well developed, in no acute distress  HEENT: normal  Neck: no JVD, carotid bruits, or masses Cardiac: RRR; no murmurs, rubs, or gallops,no edema  Respiratory:  clear to auscultation bilaterally, normal work of breathing GI: soft, nontender, nondistended, + BS MS: no deformity or atrophy  Skin: warm and dry Neuro:  Strength and sensation are intact Psych: euthymic mood, full affect  EKG:  EKG is ordered today. Personal review of the ekg ordered shows sinus rhythm, PVC   Recent Labs: 11/20/2020: Platelets 295 10/05/2021: BUN 20; Creatinine, Ser 1.10; Hemoglobin 12.6; Potassium 3.9; Sodium 136    Lipid Panel  No results found for: "CHOL", "TRIG", "HDL", "CHOLHDL", "VLDL", "LDLCALC", "LDLDIRECT"   Wt Readings from Last 3 Encounters:  11/16/21 164 lb 9.6 oz (74.7 kg)  09/15/21 166 lb (75.3 kg)  06/27/21 172 lb 9.6 oz (78.3 kg)      Other studies Reviewed: Additional studies/ records that were reviewed today include: TTE 12/20/20  Review of the above records today demonstrates:   1. Left ventricular ejection fraction, by estimation, is 60 to 65%. The  left ventricle has normal function. The left ventricle has no regional  wall motion abnormalities. There is moderate asymmetric left ventricular  hypertrophy of the basal-septal  segment. Left ventricular diastolic parameters are consistent with Grade I  diastolic dysfunction (impaired relaxation).   2. Right ventricular systolic function is normal. The right ventricular  size is normal.   3. Left atrial size was mild to moderately dilated.   4. The mitral valve is abnormal. There is mild bileaflet prolapse (more  notable prolapse of the posterior mitral valve leaflet) with resultant  mild mitral valve regurgitation. No evidence of mitral stenosis.   5. The aortic valve is tricuspid. There is mild thickening of the aortic   valve. Aortic valve regurgitation is not visualized. Aortic valve  sclerosis is present, with no evidence of aortic valve stenosis.   6. Aortic dilatation noted. There is borderline dilatation of the  ascending aorta, measuring 36 mm.   7. The inferior vena cava is normal in size with greater than 50%  respiratory variability, suggesting right atrial pressure of 3 mmHg.   Cardiac monitor 01/26/2021 personally reviewed Predominant rhythm was sinus with a minimum heart rate of 50 bpm and maximal heart rate of 97 bpm; average heart rate 67 bpm. Rare PACs noted with occasional couplets and triplets. Very frequent isolated PVCs (19.9%) as well as frequent PVC couplets (6%). Rare triplets noted. Ventricular bigeminy and trigeminy both noted with the longest spell of trigeminy being 14 minutes. In addition to isolated PVCs and couplets, 369 "ventricular tachycardia" (VT) spells of 4-5 beats (fastest heart rate 182 bpm, 4 beats; longest episode 5 beats-110 bpm) Majority of the "VT" episodes were not not noted on patient trigger/log Symptoms noted with isolated PVCs, couplets, triplets and bigeminy/trigeminy. Described as skipped irregular beats.  Myoview 02/17/2021   The study is normal. The study is low risk.   Left ventricular function is abnormal. Global function is mildly reduced. Nuclear stress EF: 46 %. The left ventricular ejection fraction is mildly decreased (45-54%). End diastolic cavity size is mildly enlarged.  Visually , the LVEF appears to be greater than the computer generated 46%.  ASSESSMENT AND PLAN:  1.  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  Heart medication monitor via ECG done today.  Burden of 19 to 20%.  Repeat monitor on flecainide showed a burden of 4%.  She is currently feeling well.  Continue with current management.  2.  SVT: Low-dose metoprolol as been added.  After the addition of metoprolol, she has had no further palpitations.  We Britton Bera continue with current  management.   Current medicines are reviewed at length with the patient today.   The patient does not have concerns  regarding her medicines.  The following changes were made today: None  Labs/ tests ordered today include:  Orders Placed This Encounter  Procedures   EKG 12-Lead     Disposition:   FU 6 months  Signed, Lisanne Ponce Meredith Leeds, MD  11/16/2021 4:51 PM     [address] [place] [id number] [phone] (office) [phone] (fax)

## 2021-11-28 DIAGNOSIS — M1711 Unilateral primary osteoarthritis, right knee: Secondary | ICD-10-CM | POA: Diagnosis not present

## 2021-11-30 DIAGNOSIS — M1990 Unspecified osteoarthritis, unspecified site: Secondary | ICD-10-CM | POA: Diagnosis not present

## 2021-11-30 DIAGNOSIS — E1121 Type 2 diabetes mellitus with diabetic nephropathy: Secondary | ICD-10-CM | POA: Diagnosis not present

## 2021-11-30 DIAGNOSIS — M810 Age-related osteoporosis without current pathological fracture: Secondary | ICD-10-CM | POA: Diagnosis not present

## 2021-11-30 DIAGNOSIS — N183 Chronic kidney disease, stage 3 unspecified: Secondary | ICD-10-CM | POA: Diagnosis not present

## 2021-11-30 DIAGNOSIS — E785 Hyperlipidemia, unspecified: Secondary | ICD-10-CM | POA: Diagnosis not present

## 2021-12-05 DIAGNOSIS — M1711 Unilateral primary osteoarthritis, right knee: Secondary | ICD-10-CM | POA: Diagnosis not present

## 2021-12-12 DIAGNOSIS — M1711 Unilateral primary osteoarthritis, right knee: Secondary | ICD-10-CM | POA: Diagnosis not present

## 2021-12-30 DIAGNOSIS — R0981 Nasal congestion: Secondary | ICD-10-CM | POA: Diagnosis not present

## 2021-12-30 DIAGNOSIS — H6980 Other specified disorders of Eustachian tube, unspecified ear: Secondary | ICD-10-CM | POA: Diagnosis not present

## 2021-12-30 DIAGNOSIS — E1121 Type 2 diabetes mellitus with diabetic nephropathy: Secondary | ICD-10-CM | POA: Diagnosis not present

## 2022-02-24 ENCOUNTER — Telehealth: Payer: Self-pay | Admitting: Cardiology

## 2022-02-24 NOTE — Telephone Encounter (Signed)
STAT if patient feels like he/she is going to faint   Are you dizzy now?  No only occurs in the morning   Do you feel faint or have you passed out?  No   Do you have any other symptoms?  Feels oriented afterwards, cold hands  Have you checked your HR and BP (record if available)?  93 o2 HR in the 40's at rest Patient has not checked BP   Patient states for the past few months she has been having dizzy spells in the morning which have become more intense over the past few days.

## 2022-02-24 NOTE — Telephone Encounter (Signed)
Returned call to patient.  Patient states she is experiencing dizzy spells when she wakes up in the mornings, dizziness subsides throughout the day and she feels fine in the afternoons/evenings.  Asked patient how much water intake she has throughout the day, patient reports she drinks about 32 oz of water per day (sometimes less). Advised patient to increase water intake, work up to 64 oz per day. Advised patient to drink 8-16 oz of water in the morning upon waking to help with morning dizzy spells.  Advised patient to call our office next week if increased water intake does not improve symptoms. Patient verbalized understanding and expressed appreciation for call.

## 2022-03-09 DIAGNOSIS — H6992 Unspecified Eustachian tube disorder, left ear: Secondary | ICD-10-CM | POA: Diagnosis not present

## 2022-03-17 DIAGNOSIS — M542 Cervicalgia: Secondary | ICD-10-CM | POA: Diagnosis not present

## 2022-03-17 DIAGNOSIS — M5030 Other cervical disc degeneration, unspecified cervical region: Secondary | ICD-10-CM | POA: Diagnosis not present

## 2022-03-17 DIAGNOSIS — M9901 Segmental and somatic dysfunction of cervical region: Secondary | ICD-10-CM | POA: Diagnosis not present

## 2022-03-23 DIAGNOSIS — H6992 Unspecified Eustachian tube disorder, left ear: Secondary | ICD-10-CM | POA: Diagnosis not present

## 2022-03-23 DIAGNOSIS — H9192 Unspecified hearing loss, left ear: Secondary | ICD-10-CM | POA: Diagnosis not present

## 2022-03-23 DIAGNOSIS — H8092 Unspecified otosclerosis, left ear: Secondary | ICD-10-CM | POA: Diagnosis not present

## 2022-03-30 DIAGNOSIS — L821 Other seborrheic keratosis: Secondary | ICD-10-CM | POA: Diagnosis not present

## 2022-03-30 DIAGNOSIS — L814 Other melanin hyperpigmentation: Secondary | ICD-10-CM | POA: Diagnosis not present

## 2022-03-30 DIAGNOSIS — D225 Melanocytic nevi of trunk: Secondary | ICD-10-CM | POA: Diagnosis not present

## 2022-03-30 DIAGNOSIS — L538 Other specified erythematous conditions: Secondary | ICD-10-CM | POA: Diagnosis not present

## 2022-03-30 DIAGNOSIS — L820 Inflamed seborrheic keratosis: Secondary | ICD-10-CM | POA: Diagnosis not present

## 2022-04-06 DIAGNOSIS — M542 Cervicalgia: Secondary | ICD-10-CM | POA: Diagnosis not present

## 2022-04-06 DIAGNOSIS — M9901 Segmental and somatic dysfunction of cervical region: Secondary | ICD-10-CM | POA: Diagnosis not present

## 2022-04-06 DIAGNOSIS — M5030 Other cervical disc degeneration, unspecified cervical region: Secondary | ICD-10-CM | POA: Diagnosis not present

## 2022-04-18 DIAGNOSIS — R0981 Nasal congestion: Secondary | ICD-10-CM | POA: Diagnosis not present

## 2022-04-18 DIAGNOSIS — J3489 Other specified disorders of nose and nasal sinuses: Secondary | ICD-10-CM | POA: Diagnosis not present

## 2022-04-18 DIAGNOSIS — R062 Wheezing: Secondary | ICD-10-CM | POA: Diagnosis not present

## 2022-04-24 ENCOUNTER — Telehealth: Payer: Self-pay | Admitting: Cardiology

## 2022-04-24 DIAGNOSIS — R002 Palpitations: Secondary | ICD-10-CM

## 2022-04-24 NOTE — Telephone Encounter (Signed)
Patient is requesting an order for a heart monitor. She states her husband passed away in January 30, 2023 and he assumes it has affected her heart.

## 2022-04-25 ENCOUNTER — Ambulatory Visit: Payer: Medicare Other | Attending: Cardiology

## 2022-04-25 DIAGNOSIS — R002 Palpitations: Secondary | ICD-10-CM

## 2022-04-25 NOTE — Telephone Encounter (Signed)
Followed up w/ pt. Informed that Dr. Elberta Fortis is agreeable to a monitor. Pt aware and agreeable to plan. Aware monitor will be mailed.

## 2022-04-25 NOTE — Progress Notes (Unsigned)
Enrolled patient for a 14 day Zio XT  monitor to be mailed to patients home  °

## 2022-04-29 DIAGNOSIS — R002 Palpitations: Secondary | ICD-10-CM

## 2022-05-08 ENCOUNTER — Telehealth: Payer: Self-pay | Admitting: Cardiology

## 2022-05-08 ENCOUNTER — Ambulatory Visit
Admission: EM | Admit: 2022-05-08 | Discharge: 2022-05-08 | Disposition: A | Discharge status: Home / Self Care | Payer: Medicare Other | Attending: Family Medicine | Admitting: Family Medicine

## 2022-05-08 DIAGNOSIS — R002 Palpitations: Secondary | ICD-10-CM

## 2022-05-08 DIAGNOSIS — R0602 Shortness of breath: Secondary | ICD-10-CM | POA: Diagnosis not present

## 2022-05-08 LAB — POCT URINALYSIS DIP (MANUAL ENTRY)
Bilirubin, UA: NEGATIVE
Blood, UA: NEGATIVE
Glucose, UA: NEGATIVE mg/dL
Ketones, POC UA: NEGATIVE mg/dL
Leukocytes, UA: NEGATIVE
Nitrite, UA: NEGATIVE
Protein Ur, POC: NEGATIVE mg/dL
Spec Grav, UA: >=1.03 — AB (ref 1.010–1.025)
Urobilinogen, UA: 0.2 E.U./dL
pH, UA: 6 (ref 5.0–8.0)

## 2022-05-08 NOTE — Discharge Instructions (Signed)
You were seen today for palpitations, fatigue and shortness of breath.  Your EKG was unchanged from previous, and we cannot do a chest xray today due to the cardiac device you are wearing.  As you do not wish to go to the ER, I recommend you follow up with your cardiologist.  If you have worsening symptoms, or develops nausea, vomiting, or chest pain then go to the ER for evaluation.

## 2022-05-08 NOTE — ED Triage Notes (Signed)
Pt c/o "feels like my heart is beating in my throat, I'm very tired, and I'm winded if I exert myself." Onset ~ 2 days ago. Says has had a recent URI for ~ 2 weeks "but that's seems to have past."

## 2022-05-08 NOTE — Telephone Encounter (Signed)
Left message to call back  

## 2022-05-08 NOTE — Telephone Encounter (Signed)
  Pt c/o Shortness Of Breath: STAT if SOB developed within the last 24 hours or pt is noticeably SOB on the phone  1. Are you currently SOB (can you hear that pt is SOB on the phone)? No   2. How long have you been experiencing SOB? This weekend   3. Are you SOB when sitting or when up moving around? Moving around  4. Are you currently experiencing any other symptoms?  Pt said, this weekend she started to get SOB, feeling very fatigue and dizzy. She is requesting if she can come in today at the office to get an EKG

## 2022-05-08 NOTE — ED Provider Notes (Signed)
[facility]    CSN: [id number] Arrival date & time: 05/08/22  1208      History   Chief Complaint Chief Complaint  Patient presents with   heart beating in throat    HPI Rose Smith is a 82 y.o. female.   Patient is here for feeling and "irregular heart beat" and feeling that her heart was beating in her throat.  This has been going on for several days.  This has happened in the past, and on two different medications. She does state her metoprolol was just refilled, and it appears different to her, not sure if related.  She is feeling fatigue, sob.   That is new for her with the palpitations.  No chest pain per se.   She did have a cold, cough, but that has resolved at this time.  Traveled in a plane 3 weeks ago.  No swelling in her ankles/feet.  She is having some burping, hiccups which is new for her.  She is having a hard time sleeping due to dreams.  She was on lexapro, but tapered off of that in December (was not able to cry after her husband passed).   She has a ZIO XT Long term monitor in place, unable to do a chest xray.  Some increased frequency at night, otherwise no UTI symptoms.       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/2020   H/O mitral valve prolapse 11/29/2020   Hyperlipidemia associated with type 2 diabetes mellitus (HCC) 11/29/2020   Arthritis of right shoulder region    S/P reverse total shoulder arthroplasty, 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   Bursitis of hip 07/26/2011     Past Surgical History:  Procedure Laterality Date   ABDOMINAL HYSTERECTOMY     CARPAL TUNNEL RELEASE     left    CHOLECYSTECTOMY N/A 03/14/2017   Procedure: LAPAROSCOPIC CHOLECYSTECTOMY;  Surgeon: Abigail Miyamoto, MD;  Location: [facility];  Service: General;  Laterality: N/A;   EXCISION/RELEASE BURSA HIP  07/26/2011   Procedure: EXCISION/RELEASE BURSA HIP;  Surgeon: Loanne Drilling, MD;  Location: [facility];  Service: Orthopedics;  Laterality: Left;  Left Hip Bursectomy with Gluteal Tendon Repair   EYE SURGERY     bilateral cataracts   REVERSE SHOULDER ARTHROPLASTY Right 11/18/2020   Procedure: RIGHT REVERSE SHOULDER ARTHROPLASTY;  Surgeon: Cammy Copa, MD;  Location: [facility];  Service: Orthopedics;  Laterality: Right;   ROTATOR CUFF REPAIR     bilateral    TONSILLECTOMY     TRANSTHORACIC ECHOCARDIOGRAM  12/2020   EF 60 to 65%.  No R WMA.  Moderate LVH worse in the basal septal region.  GR 1 DD.  Mild to moderate LA dilation.  Normal RV size and function.  Normal RAP.  Mild MVP (most notable posterior).  Mild MR.  Aortic valve sclerosis with no stenosis.   vaginal wall repair      ZIO PATCH  MONITOR  12/2020   Mostly sinus rhythm (rate range 50-97 bpm, average 67).  Rare PACs.  Frequent (19.9%) PVCs, frequent (6%) PVC couplets with prolonged spells of ventricular bigeminy or trigeminy.  369 4-5 beat runs of PVCs (fastest 180 bpm, 4 beats, longest 5 beats 410 bpm).  Mostly noted symptoms were PVC singles couplets, triplets and PVC  bigeminy/trigeminy.  4-5 beat runs not as frequently noted on Sx log    OB History   No obstetric history on file.      Home Medications    Prior to Admission medications   Medication Sig Start Date End Date Taking? Authorizing Provider  acetaminophen (TYLENOL) 650 MG CR tablet Take 2 tablets (1,300 mg total) by mouth every 8 (eight) hours as needed for pain. 10/05/21   Fayrene Helper, PA-C  aspirin EC 81 MG EC tablet Take 1 tablet (81 mg total) by  mouth daily. Swallow whole. 11/21/20   Cammy Copa, MD  atorvastatin (LIPITOR) 20 MG tablet Take 20 mg by mouth every other day. 04/08/19   [provider]  B Complex-C (B-COMPLEX WITH VITAMIN C) tablet Take 1 tablet by mouth 2 (two) times daily.    [provider]  buPROPion (WELLBUTRIN XL) 300 MG 24 hr tablet Take 300 mg by mouth every morning. 04/16/19   [provider]  calcium-vitamin D (OSCAL WITH D) 500-200 MG-UNIT tablet Take 1 tablet by mouth daily.    [provider]  CINNAMON PO Take 2.5 mLs by mouth daily.    [provider]  diclofenac sodium (VOLTAREN) 1 % GEL Apply 2 g topically daily as needed (pain).    [provider]  flecainide (TAMBOCOR) 100 MG tablet Take 1 tablet (100 mg total) by mouth 2 (two) times daily. 11/08/21   Camnitz, Andree Coss, MD  gemfibrozil (LOPID) 600 MG tablet Take 600 mg by mouth 2 (two) times daily before a meal.  05/03/13   [provider]  Ginkgo Biloba 120 MG CAPS Take 120 mg by mouth daily.    [provider]  GLUCOMANNAN PO Take 200 mg by mouth at bedtime.    [provider]  latanoprost (XALATAN) 0.005 % ophthalmic solution Place 1 drop into both eyes at bedtime.    [provider]  MAGNESIUM CITRATE PO Take 250 mg by mouth daily.    [provider]  Melatonin 5 MG CAPS Take 5 mg by mouth at bedtime.    [provider]  metoprolol succinate (TOPROL XL) 25 MG 24 hr tablet Take 0.5 tablets (12.5 mg total) by mouth daily. 11/08/21   Camnitz, Andree Coss, MD  MOUNJARO 2.5 MG/0.5ML Pen SMARTSIG:0.5 Milliliter(s) SUB-Q Once a Week 09/12/21   [provider]  omeprazole (PRILOSEC) 40 MG capsule Take 40 mg by mouth daily.  06/11/13   [provider]  ondansetron (ZOFRAN) 4 MG tablet Take 1 tablet (4 mg total) by mouth every 8 (eight) hours as needed for nausea or vomiting. 10/05/21   Fayrene Helper, PA-C  polyethylene glycol (MIRALAX /  Ethelene Hal) packet Take 17 g by mouth daily.    [provider]  pregabalin (LYRICA) 75 MG capsule Take 75 mg by mouth 2 (two) times daily.    [provider]  Semaglutide (OZEMPIC, 0.25 OR 0.5 MG/DOSE, [place]) Inject 0.5 mg into the skin.    [provider]  Vitamin D, Ergocalciferol, (DRISDOL) 1.25 MG (50000 UNIT) CAPS capsule Take 50,000 Units by mouth once a week. 04/24/19  [provider]    Family History Family History  Problem Relation Age of Onset   Hypertension Mother    Dementia Mother    Other Father        enlarged heart   Heart disease Maternal Grandfather    Stroke Paternal Grandmother    Hypertension Paternal Grandmother     Social History Social History   Tobacco Use   Smoking status: Former    Types: Cigarettes    Quit date: 01/02/1998    Years since quitting: 24.3   Smokeless tobacco: Never  Vaping Use   Vaping Use: Never used  Substance Use Topics   Alcohol use: Yes    Comment: occasional    Drug use: No     Allergies   Cefdinir, Penicillin g, Yeast-related products, and Penicillins   Review of Systems Review of Systems  Constitutional:  Positive for fatigue.  HENT: Negative.    Respiratory:  Positive for shortness of breath.   Cardiovascular:  Positive for palpitations.  Gastrointestinal: Negative.   Genitourinary: Negative.   Musculoskeletal: Negative.   Psychiatric/Behavioral: Negative.       Physical Exam Triage Vital Signs ED Triage Vitals [05/08/22 1239]  Enc Vitals Group     BP 133/65     Pulse Rate (!) 53     Resp 16     Temp 98.2 F (36.8 C)     Temp Source Oral     SpO2 96 %     Weight      Height      Head Circumference      Peak Flow      Pain Score 0     Pain Loc      Pain Edu?      Excl. in GC?    No data found.  Updated Vital Signs BP 133/65 (BP Location: Right Arm)   Pulse (!) 53   Temp 98.2 F (36.8 C) (Oral)   Resp 20   SpO2 96%   Visual Acuity Right Eye Distance:    Left Eye Distance:   Bilateral Distance:    Right Eye Near:   Left Eye Near:    Bilateral Near:     Physical Exam Constitutional:      General: She is not in acute distress.    Appearance: Normal appearance. She is normal weight. She is not ill-appearing or diaphoretic.     Comments: Conversing normal  HENT:     Nose: Nose normal.     Mouth/Throat:     Mouth: Mucous membranes are moist.  Eyes:     Extraocular Movements: Extraocular movements intact.     Pupils: Pupils are equal, round, and reactive to light.  Cardiovascular:     Rate and Rhythm: Normal rate and regular rhythm.  Pulmonary:     Effort: Pulmonary effort is normal.     Breath sounds: Normal breath sounds.  Abdominal:     Palpations: Abdomen is soft.  Musculoskeletal:        General: Normal range of motion.     Cervical back: Normal range of motion and neck supple.  Skin:    General: Skin is warm.  Neurological:     General: No focal deficit present.     Mental Status: She is alert.  Psychiatric:        Mood and Affect: Mood normal.      UC Treatments / Results  Labs (all labs ordered are listed, but only abnormal results are  displayed) Labs Reviewed  POCT URINALYSIS DIP (MANUAL ENTRY) - Abnormal; Notable for the following components:      Result Value   Spec Grav, UA >=1.030 (*)    All other components within normal limits    EKG sinus brady, PACs; unchanged from previous  Radiology No results found.  Procedures Procedures (including critical care time)  Medications Ordered in UC Medications - No data to display  Initial Impression / Assessment and Plan / UC Course  I have reviewed the triage vital signs and the nursing notes.  Pertinent labs & imaging results that were available during my care of the patient were reviewed by me and considered in my medical decision making (see chart for details).   Patient seen today for sob, fatigue and SOB.  EKG appears unchanged from previous.   Unable to obtain chest xray due to cardiac device.  She looks very good today, exam otherwise normal.  She does not wish to go to the ER at this time.  Discussed that she should follow up with her cardiologist.  If she has worsening symptoms, or develops chest pain, nausea or vomiting she should go to the ER for further evaluation.    Final Clinical Impressions(s) / UC Diagnoses   Final diagnoses:  Palpitations  Shortness of breath     Discharge Instructions      You were seen today for palpitations, fatigue and shortness of breath.  Your EKG was unchanged from previous, and we cannot do a chest xray today due to the cardiac device you are wearing.  As you do not wish to go to the ER, I recommend you follow up with your cardiologist.  If you have worsening symptoms, or develops nausea, vomiting, or chest pain then go to the ER for evaluation.     ED Prescriptions   None    PDMP not reviewed this encounter.   Jannifer Franklin, MD 05/08/22 1325

## 2022-05-11 NOTE — Telephone Encounter (Signed)
Left message to call back  

## 2022-05-12 NOTE — Telephone Encounter (Signed)
Pt returning call

## 2022-05-12 NOTE — Telephone Encounter (Signed)
Followed up with patient. She reports going to urgent care the day after calling into our office. She thinks Lyrica may have been the culprit for her issues. She is doing better now and will let us know if we need to address anything further down the road. She appreciates my continued effort to discuss this.

## 2022-05-23 ENCOUNTER — Telehealth: Payer: Self-pay | Admitting: Home Health

## 2022-05-23 ENCOUNTER — Other Ambulatory Visit: Payer: Self-pay | Admitting: Home Health

## 2022-05-23 DIAGNOSIS — R002 Palpitations: Secondary | ICD-10-CM | POA: Diagnosis not present

## 2022-05-23 NOTE — Telephone Encounter (Signed)
I rhythm called after-hours line reporting patient had symptomatic bradycardia on 05/07/2022 with heart rate 40 bpm lasted 30 seconds, patient was not reached out at the time.   Called patient's cell phone [phone], voicemail left for patient to call back to discuss symptoms.   Plan to stop metoprolol to avoid to bradycardia.  She has upcoming appointment with EP on 06/01/22.

## 2022-05-25 DIAGNOSIS — K5909 Other constipation: Secondary | ICD-10-CM | POA: Diagnosis not present

## 2022-05-25 DIAGNOSIS — Z1211 Encounter for screening for malignant neoplasm of colon: Secondary | ICD-10-CM | POA: Diagnosis not present

## 2022-05-31 NOTE — Progress Notes (Unsigned)
  Electrophysiology Office Note:   Date:  06/01/2022  ID:  Rose Smith, DOB May 05, 1940, MRN [id number]  Primary Cardiologist: Bryan Lemma, MD Electrophysiologist: Will Jorja Loa, MD   History of Present Illness:   Rose Smith is a 82 y.o. female with h/o PVCs, DM2, HLD, and SVT seen today for routine electrophysiology followup.   Since last being seen in our clinic the patient reports doing about the same. She has palpitations, mostly in the evening, but does feel like she has less energy.   She clarifies that during the time period of the "symptomatic bradycardia" she was most likely asleep, as she typically does not get out of bed prior to 0730. She wears an apple watch, though it is an older model with limited cardiac functionality.   Review of systems complete and found to be negative unless listed in HPI.   Studies Reviewed:    EKG is ordered today. Personal review shows sinus bradycardia at 57 bpm with a PVC.  Rhythm strip showed occasional PVCs.        Physical Exam:   VS:  BP 120/78   Pulse (!) 57   Ht 5' 6.5" (1.689 m)   Wt 158 lb (71.7 kg)   SpO2 95%   BMI 25.12 kg/m    Wt Readings from Last 3 Encounters:  06/01/22 158 lb (71.7 kg)  11/16/21 164 lb 9.6 oz (74.7 kg)  09/15/21 166 lb (75.3 kg)     GEN: Well nourished, well developed in no acute distress NECK: No JVD; No carotid bruits CARDIAC: Regular rate and rhythm with occasional ectopy, no murmurs, rubs, gallops RESPIRATORY:  Clear to auscultation without rales, wheezing or rhonchi  ABDOMEN: Soft, non-tender, non-distended EXTREMITIES:  No edema; No deformity   ASSESSMENT AND PLAN:    PVCs Monitor 05/2022 burden 2% Continue flecainide 100 mg BID. Would not increase room to go up.   SVT Rare and she doesn't feel like this is driving any of her symptoms  She is concerned she may be having some fatigue, though does report HRs get up with activity. Will stop toprol for now and assess  symptoms.   Follow up with Dr. Elberta Fortis in 6 months  Signed, Graciella Freer, PA-C

## 2022-06-01 ENCOUNTER — Encounter: Payer: Self-pay | Admitting: Student

## 2022-06-01 ENCOUNTER — Ambulatory Visit: Payer: Medicare Other | Attending: Student | Admitting: Student

## 2022-06-01 VITALS — BP 120/78 | HR 57 | Ht 66.5 in | Wt 158.0 lb

## 2022-06-01 DIAGNOSIS — I493 Ventricular premature depolarization: Secondary | ICD-10-CM | POA: Diagnosis not present

## 2022-06-01 DIAGNOSIS — I471 Supraventricular tachycardia, unspecified: Secondary | ICD-10-CM

## 2022-06-01 NOTE — Patient Instructions (Addendum)
Medication Instructions:  1.Stop metoprolol succinate (Toprol XL) *If you need a refill on your cardiac medications before your next appointment, please call your pharmacy*  Lab Work: None ordered If you have labs (blood work) drawn today and your tests are completely normal, you will receive your results only by: MyChart Message (if you have MyChart) OR A paper copy in the mail If you have any lab test that is abnormal or we need to change your treatment, we will call you to review the results.  Follow-Up: At [facility], you and your health needs are our priority.  As part of our continuing mission to provide you with exceptional heart care, we have created designated Provider Care Teams.  These Care Teams include your primary Cardiologist (physician) and Advanced Practice Providers (APPs -  Physician Assistants and Nurse Practitioners) who all work together to provide you with the care you need, when you need it.  Your next appointment:   6 month(s)  Provider:   Loman Brooklyn, MD

## 2022-06-05 DIAGNOSIS — H401131 Primary open-angle glaucoma, bilateral, mild stage: Secondary | ICD-10-CM | POA: Diagnosis not present

## 2022-09-25 ENCOUNTER — Telehealth: Payer: Self-pay

## 2022-09-25 NOTE — Telephone Encounter (Signed)
Boneta Lucks, with Dr. Richardean Canal dental office would like pre medication note/letter faxed to [phone]. Cb# [phone].  Please advise.  Thank you.

## 2022-09-26 ENCOUNTER — Encounter (HOSPITAL_BASED_OUTPATIENT_CLINIC_OR_DEPARTMENT_OTHER): Payer: Self-pay

## 2022-09-26 NOTE — Telephone Encounter (Signed)
Note completed and faxed

## 2022-10-02 ENCOUNTER — Telehealth: Payer: Self-pay | Admitting: Orthopedic Surgery

## 2022-10-02 MED ORDER — CLINDAMYCIN HCL 150 MG PO CAPS: ORAL_CAPSULE | ORAL | 0 refills | Status: DC

## 2022-10-02 NOTE — Telephone Encounter (Signed)
Sent clindamycin to her pharmacy. Called and advised

## 2022-10-02 NOTE — Telephone Encounter (Signed)
Pt called stating she has an upcoming dental appt on Thursday and need antibiotics before then. PT STATES SHE IS ALLERGIC TO PENICILLIN. Please call pt when medication has been called into [facility]. Pt phone number is [phone].

## 2022-10-02 NOTE — Addendum Note (Signed)
Addended by: Barbette Or on: 10/02/2022 04:23 PM   Modules accepted: Orders

## 2022-10-17 ENCOUNTER — Other Ambulatory Visit: Payer: Self-pay | Admitting: Cardiology

## 2022-11-21 ENCOUNTER — Encounter: Payer: Self-pay | Admitting: Cardiology

## 2022-11-21 ENCOUNTER — Ambulatory Visit: Payer: Medicare Other | Attending: Cardiology | Admitting: Cardiology

## 2022-11-21 VITALS — BP 112/60 | HR 56 | Ht 66.5 in | Wt 151.0 lb

## 2022-11-21 DIAGNOSIS — I493 Ventricular premature depolarization: Secondary | ICD-10-CM

## 2022-11-21 DIAGNOSIS — I471 Supraventricular tachycardia, unspecified: Secondary | ICD-10-CM | POA: Diagnosis not present

## 2022-11-21 DIAGNOSIS — R001 Bradycardia, unspecified: Secondary | ICD-10-CM | POA: Diagnosis not present

## 2022-11-21 DIAGNOSIS — R002 Palpitations: Secondary | ICD-10-CM | POA: Diagnosis not present

## 2022-11-21 NOTE — Patient Instructions (Signed)
 Medication Instructions:  Your physician recommends that you continue on your current medications as directed. Please refer to the Current Medication list given to you today.  *If you need a refill on your cardiac medications before your next appointment, please call your pharmacy*   Lab Work: None ordered   Testing/Procedures: None ordered   Follow-Up: At [facility], you and your health needs are our priority.  As part of our continuing mission to provide you with exceptional heart care, we have created designated Provider Care Teams.  These Care Teams include your primary Cardiologist (physician) and Advanced Practice Providers (APPs -  Physician Assistants and Nurse Practitioners) who all work together to provide you with the care you need, when you need it.  Your next appointment:   1 year(s)  The format for your next appointment:   In Person  Provider:   You will see one of the following Advanced Practice Providers on your designated Care Team:   Francis Dowse, [place] "Mardelle Matte" [place], [place] Canary Brim, NP   Thank you for choosing [facility]!!   Dory Horn, RN [phone]

## 2022-11-21 NOTE — Progress Notes (Signed)
  Electrophysiology Office Note:   Date:  11/21/2022  ID:  Rose Smith, DOB 1940-02-22, MRN [id number]  Primary Cardiologist: Bryan Lemma, MD Electrophysiologist: Willaim Mode Jorja Loa, MD      History of Present Illness:   Rose Smith is a 82 y.o. female with h/o PVCs, hyperlipidemia, diabetes, mitral valve prolapse seen today for routine electrophysiology followup.   Since last being seen in our clinic the patient reports doing overall well.  She continues to have episodic palpitations, but they only occur every few days.  She continues to be able to do her daily activities.  Without major complaint.  she denies chest pain, palpitations, dyspnea, PND, orthopnea, nausea, vomiting, dizziness, syncope, edema, weight gain, or early satiety.   Review of systems complete and found to be negative unless listed in HPI.   EP Information / Studies Reviewed:    EKG is ordered today. Personal review as below.  EKG Interpretation Date/Time:  Tuesday November 21 2022 14:13:38 EST Ventricular Rate:  56 PR Interval:  154 QRS Duration:  92 QT Interval:  472 QTC Calculation: 455 R Axis:   -1  Text Interpretation: Sinus bradycardia with sinus arrhythmia with occasional Premature ventricular complexes When compared with ECG of 08-May-2022 12:46, Premature ventricular complexes are now Present Premature atrial complexes are no longer Present Confirmed by [employer], Deveon Kisiel ([id number]) on 11/21/2022 2:23:47 PM     Risk Assessment/Calculations:              Physical Exam:   VS:  BP 112/60 (BP Location: Left Arm, Patient Position: Sitting, Cuff Size: Normal)   Pulse (!) 56   Ht 5' 6.5" (1.689 m)   Wt 151 lb (68.5 kg)   SpO2 95%   BMI 24.01 kg/m    Wt Readings from Last 3 Encounters:  11/21/22 151 lb (68.5 kg)  06/01/22 158 lb (71.7 kg)  11/16/21 164 lb 9.6 oz (74.7 kg)     GEN: Well nourished, well developed in no acute distress NECK: No JVD; No carotid bruits CARDIAC: Regular rate  and rhythm, no murmurs, rubs, gallops RESPIRATORY:  Clear to auscultation without rales, wheezing or rhonchi  ABDOMEN: Soft, non-tender, non-distended EXTREMITIES:  No edema; No deformity   ASSESSMENT AND PLAN:   1.  PVCs: Currently on flecainide.  Burden was initially 19 to 20%.  Repeat monitor shows a burden of 2%.  Minimal symptoms.  [name] continue with current management.  2.  SVT: Currently on flecainide.  Minimal palpitations.  Continue current management.  Follow up with EP APP in 12 months  Signed, Stepan Verrette Jorja Loa, MD

## 2023-01-09 DIAGNOSIS — N130 Hydronephrosis with ureteropelvic junction obstruction: Secondary | ICD-10-CM | POA: Diagnosis not present

## 2023-01-09 DIAGNOSIS — Q6211 Congenital occlusion of ureteropelvic junction: Secondary | ICD-10-CM | POA: Diagnosis not present

## 2023-01-09 DIAGNOSIS — N3281 Overactive bladder: Secondary | ICD-10-CM | POA: Diagnosis not present

## 2023-01-18 DIAGNOSIS — Z1231 Encounter for screening mammogram for malignant neoplasm of breast: Secondary | ICD-10-CM | POA: Diagnosis not present

## 2023-03-15 DIAGNOSIS — E119 Type 2 diabetes mellitus without complications: Secondary | ICD-10-CM | POA: Diagnosis not present

## 2023-03-26 DIAGNOSIS — M1711 Unilateral primary osteoarthritis, right knee: Secondary | ICD-10-CM | POA: Diagnosis not present

## 2023-03-29 DIAGNOSIS — E559 Vitamin D deficiency, unspecified: Secondary | ICD-10-CM | POA: Diagnosis not present

## 2023-03-29 DIAGNOSIS — E1121 Type 2 diabetes mellitus with diabetic nephropathy: Secondary | ICD-10-CM | POA: Diagnosis not present

## 2023-03-29 DIAGNOSIS — E785 Hyperlipidemia, unspecified: Secondary | ICD-10-CM | POA: Diagnosis not present

## 2023-03-29 DIAGNOSIS — N183 Chronic kidney disease, stage 3 unspecified: Secondary | ICD-10-CM | POA: Diagnosis not present

## 2023-04-03 DIAGNOSIS — M797 Fibromyalgia: Secondary | ICD-10-CM | POA: Diagnosis not present

## 2023-04-03 DIAGNOSIS — H401131 Primary open-angle glaucoma, bilateral, mild stage: Secondary | ICD-10-CM | POA: Diagnosis not present

## 2023-04-03 DIAGNOSIS — N183 Chronic kidney disease, stage 3 unspecified: Secondary | ICD-10-CM | POA: Diagnosis not present

## 2023-04-03 DIAGNOSIS — E1121 Type 2 diabetes mellitus with diabetic nephropathy: Secondary | ICD-10-CM | POA: Diagnosis not present

## 2023-04-03 DIAGNOSIS — E785 Hyperlipidemia, unspecified: Secondary | ICD-10-CM | POA: Diagnosis not present

## 2023-04-03 DIAGNOSIS — E559 Vitamin D deficiency, unspecified: Secondary | ICD-10-CM | POA: Diagnosis not present

## 2023-04-03 DIAGNOSIS — M810 Age-related osteoporosis without current pathological fracture: Secondary | ICD-10-CM | POA: Diagnosis not present

## 2023-04-03 DIAGNOSIS — I493 Ventricular premature depolarization: Secondary | ICD-10-CM | POA: Diagnosis not present

## 2023-04-03 DIAGNOSIS — K5904 Chronic idiopathic constipation: Secondary | ICD-10-CM | POA: Diagnosis not present

## 2023-04-03 DIAGNOSIS — M1990 Unspecified osteoarthritis, unspecified site: Secondary | ICD-10-CM | POA: Diagnosis not present

## 2023-04-04 ENCOUNTER — Ambulatory Visit: Admitting: Orthopedic Surgery

## 2023-04-04 ENCOUNTER — Other Ambulatory Visit (INDEPENDENT_AMBULATORY_CARE_PROVIDER_SITE_OTHER): Payer: Self-pay

## 2023-04-04 ENCOUNTER — Other Ambulatory Visit: Payer: Self-pay

## 2023-04-04 DIAGNOSIS — L820 Inflamed seborrheic keratosis: Secondary | ICD-10-CM | POA: Diagnosis not present

## 2023-04-04 DIAGNOSIS — M19012 Primary osteoarthritis, left shoulder: Secondary | ICD-10-CM

## 2023-04-04 DIAGNOSIS — D225 Melanocytic nevi of trunk: Secondary | ICD-10-CM | POA: Diagnosis not present

## 2023-04-04 DIAGNOSIS — M25512 Pain in left shoulder: Secondary | ICD-10-CM

## 2023-04-04 DIAGNOSIS — L720 Epidermal cyst: Secondary | ICD-10-CM | POA: Diagnosis not present

## 2023-04-04 DIAGNOSIS — I788 Other diseases of capillaries: Secondary | ICD-10-CM | POA: Diagnosis not present

## 2023-04-04 DIAGNOSIS — L538 Other specified erythematous conditions: Secondary | ICD-10-CM | POA: Diagnosis not present

## 2023-04-04 DIAGNOSIS — L821 Other seborrheic keratosis: Secondary | ICD-10-CM | POA: Diagnosis not present

## 2023-04-04 DIAGNOSIS — L814 Other melanin hyperpigmentation: Secondary | ICD-10-CM | POA: Diagnosis not present

## 2023-04-07 ENCOUNTER — Encounter: Payer: Self-pay | Admitting: Orthopedic Surgery

## 2023-04-07 MED ORDER — LIDOCAINE HCL 1 % IJ SOLN
5.0000 mL | INTRAMUSCULAR | Status: AC | PRN
  Administered 2023-04-04: 5 mL

## 2023-04-07 MED ORDER — BUPIVACAINE HCL 0.5 % IJ SOLN
9.0000 mL | INTRAMUSCULAR | Status: AC | PRN
  Administered 2023-04-04: 9 mL via INTRA_ARTICULAR

## 2023-04-07 MED ORDER — METHYLPREDNISOLONE ACETATE 40 MG/ML IJ SUSP
40.0000 mg | INTRAMUSCULAR | Status: AC | PRN
  Administered 2023-04-04: 40 mg via INTRA_ARTICULAR

## 2023-04-07 NOTE — Progress Notes (Signed)
 Office Visit Note   Patient: Rose Smith           Date of Birth: 11/13/1940           MRN: [id number] Visit Date: 04/04/2023 Requested by: Irven Coe, MD [address] [place],  [place] [address] PCP: Irven Coe, MD  Subjective: Chief Complaint  Patient presents with   Left Shoulder - Pain    HPI: Rose Smith is a 83 y.o. female who presents to the office reporting left shoulder pain of 4 to 6 weeks duration.  Denies any history of injury.  Pain radiates to the elbow.  Denies any numbness and tingling or neck symptoms.  Does report some biceps pain.  Had deep tissue massage 6 weeks ago.  Neck pain is present.  Hard for her to sleep on that left-hand side.  She does weightlifting.  Doing well from right reverse shoulder replacement 11/18/2020..                ROS: All systems reviewed are negative as they relate to the chief complaint within the history of present illness.  Patient denies fevers or chills.  Assessment & Plan: Visit Diagnoses:  1. Left shoulder pain, unspecified chronicity     Plan: Impression is left shoulder arthritis.  Doing well from right reverse shoulder replacement.  Ultrasound-guided injection performed today.  This is not really reaching the threshold yet for surgery.  She has good rotator cuff strength and well-maintained motion.  Follow-Up Instructions: No follow-ups on file.   Orders:  Orders Placed This Encounter  Procedures   XR Shoulder Left   US Guided Needle Placement - No Linked Charges   No orders of the defined types were placed in this encounter.     Procedures: Large Joint Inj: L glenohumeral on 04/04/2023 5:01 PM Indications: diagnostic evaluation and pain Details: 22 G 3.5 in needle, ultrasound-guided posterior approach  Arthrogram: No  Medications: 9 mL bupivacaine 0.5 %; 40 mg methylPREDNISolone acetate 40 MG/ML; 5 mL lidocaine 1 % Outcome: tolerated well, no immediate complications Procedure,  treatment alternatives, risks and benefits explained, specific risks discussed. Consent was given by the patient. Immediately prior to procedure a time out was called to verify the correct patient, procedure, equipment, support staff and site/side marked as required. Patient was prepped and draped in the usual sterile fashion.       Clinical Data: No additional findings.  Objective: Vital Signs: There were no vitals taken for this visit.  Physical Exam:  Constitutional: Patient appears well-developed HEENT:  Head: Normocephalic Eyes:EOM are normal Neck: Normal range of motion Cardiovascular: Normal rate Pulmonary/chest: Effort normal Neurologic: Patient is alert Skin: Skin is warm Psychiatric: Patient has normal mood and affect  Ortho Exam: Ortho exam demonstrates range of motion on the left of 70/100/150.  Rotator cuff strength is intact to infraspinatus supraspinatus and subscap muscle testing.  No masses lymphadenopathy or skin changes noted in that left shoulder girdle region.  Cervical spine range of motion intact.  Not too much coarseness or grinding with internal/external rotation of the arm.   Specialty Comments:  No specialty comments available.  Imaging: No results found.   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   Bursitis of hip 07/26/2011   Past Medical History:  Diagnosis Date   Anemia    Anxiety    mild   Arthritis    lower back   Diabetes mellitus type 2, controlled, without complications (HCC)    GERD (gastroesophageal reflux disease)    Hyperlipidemia    Mitral valve  prolapse    childhood murmur, per patient   Pneumonia    with COVID in 2021    Family History  Problem Relation Age of Onset   Hypertension Mother    Dementia Mother    Other Father        enlarged heart   Heart disease Maternal Grandfather    Stroke Paternal Grandmother    Hypertension Paternal Grandmother     Past Surgical History:  Procedure Laterality Date   ABDOMINAL HYSTERECTOMY     CARPAL TUNNEL RELEASE     left    CHOLECYSTECTOMY N/A 03/14/2017   Procedure: LAPAROSCOPIC CHOLECYSTECTOMY;  Surgeon: Abigail Miyamoto, MD;  Location: [facility] ORS;  Service: General;  Laterality: N/A;   EXCISION/RELEASE BURSA HIP  07/26/2011   Procedure: EXCISION/RELEASE BURSA HIP;  Surgeon: Loanne Drilling, MD;  Location: [facility];  Service: Orthopedics;  Laterality: Left;  Left Hip Bursectomy with Gluteal Tendon Repair   EYE SURGERY     bilateral cataracts   REVERSE SHOULDER ARTHROPLASTY Right 11/18/2020   Procedure: RIGHT REVERSE SHOULDER ARTHROPLASTY;  Surgeon: Cammy Copa, MD;  Location: [facility];  Service: Orthopedics;  Laterality: Right;   ROTATOR CUFF REPAIR     bilateral    TONSILLECTOMY     TRANSTHORACIC ECHOCARDIOGRAM  12/2020   EF 60 to 65%.  No R WMA.  Moderate LVH worse in the basal septal region.  GR 1 DD.  Mild to moderate LA dilation.  Normal RV size and function.  Normal RAP.  Mild MVP (most notable posterior).  Mild MR.  Aortic valve sclerosis with no stenosis.   vaginal wall repair      ZIO PATCH MONITOR  12/2020   Mostly sinus rhythm (rate range 50-97 bpm, average 67).  Rare PACs.  Frequent (19.9%) PVCs, frequent (6%) PVC couplets with prolonged spells of ventricular bigeminy or trigeminy.  369 4-5 beat runs of PVCs (fastest 180 bpm, 4 beats, longest 5 beats 410 bpm).  Mostly noted symptoms were PVC singles couplets, triplets and PVC  bigeminy/trigeminy.  4-5 beat runs not as frequently noted on Sx log   Social History   Occupational History   Not on file  Tobacco Use    Smoking status: Former    Current packs/day: 0.00    Types: Cigarettes    Quit date: 01/02/1998    Years since quitting: 25.2   Smokeless tobacco: Never  Vaping Use   Vaping status: Never Used  Substance and Sexual Activity   Alcohol use: Yes    Comment: occasional    Drug use: No   Sexual activity: Not on file

## 2023-04-26 DIAGNOSIS — R413 Other amnesia: Secondary | ICD-10-CM | POA: Diagnosis not present

## 2023-07-02 DIAGNOSIS — E1121 Type 2 diabetes mellitus with diabetic nephropathy: Secondary | ICD-10-CM | POA: Diagnosis not present

## 2023-07-02 DIAGNOSIS — N183 Chronic kidney disease, stage 3 unspecified: Secondary | ICD-10-CM | POA: Diagnosis not present

## 2023-07-02 DIAGNOSIS — H401131 Primary open-angle glaucoma, bilateral, mild stage: Secondary | ICD-10-CM | POA: Diagnosis not present

## 2023-07-04 DIAGNOSIS — H10411 Chronic giant papillary conjunctivitis, right eye: Secondary | ICD-10-CM | POA: Diagnosis not present

## 2023-07-14 DIAGNOSIS — T1512XA Foreign body in conjunctival sac, left eye, initial encounter: Secondary | ICD-10-CM | POA: Diagnosis not present

## 2023-08-02 DIAGNOSIS — E1121 Type 2 diabetes mellitus with diabetic nephropathy: Secondary | ICD-10-CM | POA: Diagnosis not present

## 2023-08-02 DIAGNOSIS — N183 Chronic kidney disease, stage 3 unspecified: Secondary | ICD-10-CM | POA: Diagnosis not present

## 2023-08-02 DIAGNOSIS — H401131 Primary open-angle glaucoma, bilateral, mild stage: Secondary | ICD-10-CM | POA: Diagnosis not present

## 2023-08-24 ENCOUNTER — Telehealth: Payer: Self-pay

## 2023-08-24 NOTE — Telephone Encounter (Signed)
 I spoke with Rose Smith tonight who called out of concern for an elevated heart rate. Her watch was telling her that her heart rate was ~110 to 115 but that she was not in atrial fibrillation. She does have a history of SVT seen on prior holter monitors and follows with EP (Dr Inocencio). She denied any symptoms beyond a sensation of her heart beating, including chest pain, presyncope, syncope. She took her blood pressure while we were speaking on the phone and it was 116/86 (in her normal range). Given the absence of symptoms beyond palpitations and a normal blood pressure, I said she was likely in SVT but that there is no immediate need to go to the ER unless there were symptoms or it were effecting her blood pressure. She acknowledged understanding. She will come to the ER in the AM if still tachycardic or if her BP drops or if she develops symptoms and we will arrange for follow up.

## 2023-08-28 NOTE — Telephone Encounter (Signed)
 Pt made aware office will call to arrange an appt.

## 2023-08-29 NOTE — Telephone Encounter (Signed)
 Spoke w/ patient - she is scheduled to see Jodie Passey, PA on 9/11.

## 2023-09-02 DIAGNOSIS — N183 Chronic kidney disease, stage 3 unspecified: Secondary | ICD-10-CM | POA: Diagnosis not present

## 2023-09-02 DIAGNOSIS — E1121 Type 2 diabetes mellitus with diabetic nephropathy: Secondary | ICD-10-CM | POA: Diagnosis not present

## 2023-09-02 DIAGNOSIS — H401131 Primary open-angle glaucoma, bilateral, mild stage: Secondary | ICD-10-CM | POA: Diagnosis not present

## 2023-09-11 DIAGNOSIS — H401131 Primary open-angle glaucoma, bilateral, mild stage: Secondary | ICD-10-CM | POA: Diagnosis not present

## 2023-09-13 ENCOUNTER — Ambulatory Visit: Admitting: Student

## 2023-09-14 ENCOUNTER — Ambulatory Visit: Attending: Student | Admitting: Student

## 2023-09-14 ENCOUNTER — Encounter: Payer: Self-pay | Admitting: Student

## 2023-09-14 VITALS — BP 110/66 | HR 112 | Ht 66.5 in | Wt 156.0 lb

## 2023-09-14 DIAGNOSIS — I493 Ventricular premature depolarization: Secondary | ICD-10-CM | POA: Diagnosis not present

## 2023-09-14 DIAGNOSIS — I471 Supraventricular tachycardia, unspecified: Secondary | ICD-10-CM | POA: Diagnosis not present

## 2023-09-14 DIAGNOSIS — I479 Paroxysmal tachycardia, unspecified: Secondary | ICD-10-CM

## 2023-09-14 MED ORDER — METOPROLOL SUCCINATE ER 25 MG PO TB24
12.5000 mg | ORAL_TABLET | Freq: Every day | ORAL | 3 refills | Status: AC
  Filled 2023-12-05: qty 45, 90d supply, fill #0

## 2023-09-14 NOTE — Patient Instructions (Signed)
 Medication Instructions:  1.Start metoprolol  succinate 1/2 tablet (12.5 mg) daily at bedtime *If you need a refill on your cardiac medications before your next appointment, please call your pharmacy*  Lab Work: None ordered If you have labs (blood work) drawn today and your tests are completely normal, you will receive your results only by: MyChart Message (if you have MyChart) OR A paper copy in the mail If you have any lab test that is abnormal or we need to change your treatment, we will call you to review the results.  Follow-Up: At [facility], you and your health needs are our priority.  As part of our continuing mission to provide you with exceptional heart care, our providers are all part of one team.  This team includes your primary Cardiologist (physician) and Advanced Practice Providers or APPs (Physician Assistants and Nurse Practitioners) who all work together to provide you with the care you need, when you need it.  Your next appointment:   6-8 week(s)  Provider:   Soyla Norton, MD or Ozell Jodie Passey, PA-C

## 2023-09-14 NOTE — Progress Notes (Signed)
  Electrophysiology Office Note:   Date:  09/14/2023  ID:  Rose Smith, DOB [birth year], MRN [id number]  Primary Cardiologist: Alm Clay, MD Electrophysiologist: Will Gladis Norton, MD   Electrophysiologist:  Soyla Gladis Norton, MD      History of Present Illness:   Rose Smith is a 83 y.o. female with h/o PVCs, DM2, HLD, and SVT seen today for acute visit due to tachycardia.    Patient reports for the past couple of weeks she has had intermittent HRs in the 110-115 range. No specific symptoms. Denies SOB, CP, or edema. No tachy-palpitations. No recent illnesses or missed medication. Apple watch does show that she has normal HR at times, in the 50-60 range.   Review of systems complete and found to be negative unless listed in HPI.   EP Information / Studies Reviewed:    EKG is ordered today. Personal review as below.  EKG Interpretation Date/Time:  Friday September 14 2023 12:15:20 EDT Ventricular Rate:  112 PR Interval:  182 QRS Duration:  116 QT Interval:  338 QTC Calculation: 461 R Axis:   -12  Text Interpretation: Sinus tachycardia vs Atrial tachycardia Confirmed by Lesia Sharper [phone]) on 09/14/2023 12:31:20 PM    Arrhythmia/Device History No specialty comments available.   Physical Exam:   VS:  BP 110/66   Pulse (!) 112   Ht 5' 6.5 (1.689 m)   Wt 156 lb (70.8 kg)   SpO2 99%   BMI 24.80 kg/m    Wt Readings from Last 3 Encounters:  09/14/23 156 lb (70.8 kg)  11/21/22 151 lb (68.5 kg)  06/01/22 158 lb (71.7 kg)     GEN: No acute distress NECK: No JVD; No carotid bruits CARDIAC: Regular rate and rhythm, no murmurs, rubs, gallops RESPIRATORY:  Clear to auscultation without rales, wheezing or rhonchi  ABDOMEN: Soft, non-tender, non-distended EXTREMITIES:  No edema; No deformity   ASSESSMENT AND PLAN:    PVCs Stable on flecainide  100 mg BID  SVT Sinus tach vs Atrial tach EKG today may represent sinus tach vs an atrial tach Will add  low dose toprol  12.5 and follow symptoms.  Consider monitor to help clarify rhythm if it continues  Follow up with EP APP in 6-8 weeks  Signed, Sharper Prentice Lesia, PA-C

## 2023-10-02 DIAGNOSIS — E559 Vitamin D deficiency, unspecified: Secondary | ICD-10-CM | POA: Diagnosis not present

## 2023-10-02 DIAGNOSIS — E785 Hyperlipidemia, unspecified: Secondary | ICD-10-CM | POA: Diagnosis not present

## 2023-10-02 DIAGNOSIS — E1121 Type 2 diabetes mellitus with diabetic nephropathy: Secondary | ICD-10-CM | POA: Diagnosis not present

## 2023-10-02 DIAGNOSIS — N183 Chronic kidney disease, stage 3 unspecified: Secondary | ICD-10-CM | POA: Diagnosis not present

## 2023-10-02 DIAGNOSIS — H401131 Primary open-angle glaucoma, bilateral, mild stage: Secondary | ICD-10-CM | POA: Diagnosis not present

## 2023-10-04 DIAGNOSIS — E1121 Type 2 diabetes mellitus with diabetic nephropathy: Secondary | ICD-10-CM | POA: Diagnosis not present

## 2023-10-04 DIAGNOSIS — M8588 Other specified disorders of bone density and structure, other site: Secondary | ICD-10-CM | POA: Diagnosis not present

## 2023-10-04 DIAGNOSIS — E785 Hyperlipidemia, unspecified: Secondary | ICD-10-CM | POA: Diagnosis not present

## 2023-10-04 DIAGNOSIS — N3281 Overactive bladder: Secondary | ICD-10-CM | POA: Diagnosis not present

## 2023-10-04 DIAGNOSIS — Q6239 Other obstructive defects of renal pelvis and ureter: Secondary | ICD-10-CM | POA: Diagnosis not present

## 2023-10-04 DIAGNOSIS — Z0000 Encounter for general adult medical examination without abnormal findings: Secondary | ICD-10-CM | POA: Diagnosis not present

## 2023-10-04 DIAGNOSIS — M797 Fibromyalgia: Secondary | ICD-10-CM | POA: Diagnosis not present

## 2023-10-04 DIAGNOSIS — E559 Vitamin D deficiency, unspecified: Secondary | ICD-10-CM | POA: Diagnosis not present

## 2023-10-04 DIAGNOSIS — I493 Ventricular premature depolarization: Secondary | ICD-10-CM | POA: Diagnosis not present

## 2023-10-04 DIAGNOSIS — K5904 Chronic idiopathic constipation: Secondary | ICD-10-CM | POA: Diagnosis not present

## 2023-10-04 DIAGNOSIS — N183 Chronic kidney disease, stage 3 unspecified: Secondary | ICD-10-CM | POA: Diagnosis not present

## 2023-10-09 ENCOUNTER — Other Ambulatory Visit: Payer: Self-pay | Admitting: Student

## 2023-10-19 DIAGNOSIS — M7061 Trochanteric bursitis, right hip: Secondary | ICD-10-CM | POA: Diagnosis not present

## 2023-10-19 DIAGNOSIS — M25551 Pain in right hip: Secondary | ICD-10-CM | POA: Diagnosis not present

## 2023-11-05 ENCOUNTER — Encounter: Payer: Self-pay | Admitting: Radiology

## 2023-11-13 ENCOUNTER — Other Ambulatory Visit (HOSPITAL_COMMUNITY): Payer: Self-pay

## 2023-11-13 ENCOUNTER — Ambulatory Visit: Attending: Cardiology | Admitting: Cardiology

## 2023-11-13 ENCOUNTER — Encounter: Payer: Self-pay | Admitting: Cardiology

## 2023-11-13 VITALS — BP 112/50 | HR 49 | Ht 66.0 in | Wt 160.6 lb

## 2023-11-13 DIAGNOSIS — I479 Paroxysmal tachycardia, unspecified: Secondary | ICD-10-CM | POA: Diagnosis not present

## 2023-11-13 DIAGNOSIS — R001 Bradycardia, unspecified: Secondary | ICD-10-CM

## 2023-11-13 DIAGNOSIS — I493 Ventricular premature depolarization: Secondary | ICD-10-CM

## 2023-11-13 DIAGNOSIS — R002 Palpitations: Secondary | ICD-10-CM

## 2023-11-13 DIAGNOSIS — I471 Supraventricular tachycardia, unspecified: Secondary | ICD-10-CM | POA: Diagnosis not present

## 2023-11-13 NOTE — Patient Instructions (Signed)
 Medication Instructions:  Your physician recommends that you continue on your current medications as directed. Please refer to the Current Medication list given to you today.  *If you need a refill on your cardiac medications before your next appointment, please call your pharmacy*  Lab Work: None ordered.  If you have labs (blood work) drawn today and your tests are completely normal, you will receive your results only by: MyChart Message (if you have MyChart) OR A paper copy in the mail If you have any lab test that is abnormal or we need to change your treatment, we will call you to review the results.  Testing/Procedures: None ordered.   Follow-Up: At [facility], you and your health needs are our priority.  As part of our continuing mission to provide you with exceptional heart care, our providers are all part of one team.  This team includes your primary Cardiologist (physician) and Advanced Practice Providers or APPs (Physician Assistants and Nurse Practitioners) who all work together to provide you with the care you need, when you need it.  Your next appointment:   6 months with Dr Inocencio APP

## 2023-11-13 NOTE — Progress Notes (Signed)
  Electrophysiology Office Note:   Date:  11/13/2023  ID:  MARNITA POIRIER, DOB 1940-01-03, MRN [id number]  Primary Cardiologist: Alm Clay, MD Primary Heart Failure: None Electrophysiologist: Samarth Ogle Gladis Norton, MD      History of Present Illness:   Rose Smith is a 83 y.o. female with h/o PVCs, hyperlipidemia, diabetes, mitral valve prolapse seen today for routine electrophysiology followup.   Since last being seen in our clinic the patient reports doing overall well.  She did present to EP clinic and was found to be tachycardic.  Metoprolol  was added.  Her heart rates are now in the 40s at rest.  She is able to exert herself without issue.  She does have some fatigue.  She takes her metoprolol  at night.  Despite her fatigue, she is happy with her control.  she denies chest pain, palpitations, dyspnea, PND, orthopnea, nausea, vomiting, dizziness, syncope, edema, weight gain, or early satiety.   Review of systems complete and found to be negative unless listed in HPI.   EP Information / Studies Reviewed:    EKG is ordered today. Personal review as below.        Risk Assessment/Calculations:           Physical Exam:   VS:  BP (!) 112/50   Pulse (!) 49   Ht 5' 6 (1.676 m)   Wt 160 lb 9.6 oz (72.8 kg)   SpO2 93%   BMI 25.92 kg/m    Wt Readings from Last 3 Encounters:  11/13/23 160 lb 9.6 oz (72.8 kg)  09/14/23 156 lb (70.8 kg)  11/21/22 151 lb (68.5 kg)     GEN: Well nourished, well developed in no acute distress NECK: No JVD; No carotid bruits CARDIAC: Bradycardic, regular, no murmurs, rubs, gallops RESPIRATORY:  Clear to auscultation without rales, wheezing or rhonchi  ABDOMEN: Soft, non-tender, non-distended EXTREMITIES:  No edema; No deformity   ASSESSMENT AND PLAN:    1.  PVCs: On flecainide .  Burden was initially 20%.  Repeat monitor shows a burden of 2%.  Continue with current management  2.  SVT: On flecainide .  She did have an atrial  tachycardia and was started on metoprolol .  Her heart rates are slow today, but she has no awareness of this.  She is happy with her control.  Alixander Rallis continue with current management.  If she does continue to have slow heart rates at her next visit, a monitor may be reasonable to ensure that she is not having episodes of heart block.  3.  High risk medication monitoring: On flecainide .  QRS remains narrow.  Follow up with EP Team in 6 months  Signed, Domenique Southers Gladis Norton, MD

## 2023-11-14 ENCOUNTER — Other Ambulatory Visit (HOSPITAL_COMMUNITY): Payer: Self-pay

## 2023-11-16 ENCOUNTER — Other Ambulatory Visit (HOSPITAL_COMMUNITY): Payer: Self-pay

## 2023-11-16 MED ORDER — LATANOPROST 0.005 % OP SOLN
1.0000 [drp] | Freq: Every day | OPHTHALMIC | 1 refills | Status: AC
  Filled 2023-11-16: qty 2.5, 25d supply, fill #0
  Filled 2023-12-11: qty 5, 50d supply, fill #0

## 2023-11-16 MED ORDER — ERGOCALCIFEROL 1.25 MG (50000 UT) PO CAPS
50000.0000 [IU] | ORAL_CAPSULE | ORAL | 3 refills | Status: DC
  Filled 2023-11-16: qty 12, 84d supply, fill #0

## 2023-11-16 MED ORDER — SOLIFENACIN SUCCINATE 5 MG PO TABS
5.0000 mg | ORAL_TABLET | Freq: Every day | ORAL | 3 refills | Status: DC
  Filled 2023-11-16 – 2023-12-11 (×2): qty 90, 90d supply, fill #0

## 2023-11-16 MED ORDER — BUPROPION HCL ER (XL) 300 MG PO TB24
300.0000 mg | ORAL_TABLET | Freq: Every morning | ORAL | 1 refills | Status: AC
  Filled 2023-12-11: qty 90, 90d supply, fill #0

## 2023-11-16 MED ORDER — ATORVASTATIN CALCIUM 20 MG PO TABS
20.0000 mg | ORAL_TABLET | ORAL | 3 refills | Status: AC
  Filled 2023-12-11: qty 30, 60d supply, fill #0

## 2023-11-16 MED ORDER — FLECAINIDE ACETATE 100 MG PO TABS
100.0000 mg | ORAL_TABLET | Freq: Two times a day (BID) | ORAL | 3 refills | Status: AC
  Filled 2023-11-16 – 2023-12-11 (×2): qty 180, 90d supply, fill #0

## 2023-12-04 ENCOUNTER — Telehealth: Payer: Self-pay | Admitting: Student

## 2023-12-04 DIAGNOSIS — E1121 Type 2 diabetes mellitus with diabetic nephropathy: Secondary | ICD-10-CM | POA: Diagnosis not present

## 2023-12-04 NOTE — Telephone Encounter (Signed)
*  STAT* If patient is at the pharmacy, call can be transferred to refill team.   1. Which medications need to be refilled? (please list name of each medication and dose if known)   metoprolol  succinate (TOPROL  XL) 25 MG 24 hr tablet  2. Which pharmacy/location (including street and city if local pharmacy) is medication to be sent to?  [place] [facility]  3. Do they need a 30 day or 90 day supply? 90

## 2023-12-05 ENCOUNTER — Other Ambulatory Visit (HOSPITAL_COMMUNITY): Payer: Self-pay

## 2023-12-05 NOTE — Telephone Encounter (Signed)
 Placed call to pt and let her know that our pharmacy will contact [facility] and get her rx's transferred over.  Metoprolol .

## 2023-12-11 ENCOUNTER — Other Ambulatory Visit (HOSPITAL_COMMUNITY): Payer: Self-pay

## 2023-12-11 MED ORDER — ERGOCALCIFEROL 1.25 MG (50000 UT) PO CAPS
1.0000 | ORAL_CAPSULE | ORAL | 3 refills | Status: AC
  Filled 2023-12-11: qty 12, 84d supply, fill #0

## 2023-12-13 ENCOUNTER — Other Ambulatory Visit (HOSPITAL_COMMUNITY): Payer: Self-pay

## 2023-12-24 ENCOUNTER — Telehealth: Payer: Self-pay | Admitting: Orthopedic Surgery

## 2023-12-24 NOTE — Telephone Encounter (Signed)
 Pt would like to request a mri of the left shoulder. Pt states still having severe pain

## 2023-12-25 ENCOUNTER — Other Ambulatory Visit: Payer: Self-pay

## 2023-12-25 ENCOUNTER — Ambulatory Visit: Admitting: Obstetrics and Gynecology

## 2023-12-25 ENCOUNTER — Encounter: Payer: Self-pay | Admitting: Obstetrics and Gynecology

## 2023-12-25 ENCOUNTER — Other Ambulatory Visit (HOSPITAL_COMMUNITY): Payer: Self-pay

## 2023-12-25 VITALS — BP 92/50 | HR 52 | Ht 65.5 in | Wt 155.0 lb

## 2023-12-25 DIAGNOSIS — R350 Frequency of micturition: Secondary | ICD-10-CM | POA: Diagnosis not present

## 2023-12-25 DIAGNOSIS — N952 Postmenopausal atrophic vaginitis: Secondary | ICD-10-CM

## 2023-12-25 DIAGNOSIS — N362 Urethral caruncle: Secondary | ICD-10-CM

## 2023-12-25 DIAGNOSIS — N3281 Overactive bladder: Secondary | ICD-10-CM

## 2023-12-25 LAB — POCT URINALYSIS DIP (CLINITEK)
Bilirubin, UA: NEGATIVE
Blood, UA: NEGATIVE
Glucose, UA: NEGATIVE mg/dL
Ketones, POC UA: NEGATIVE mg/dL
Leukocytes, UA: NEGATIVE
Nitrite, UA: NEGATIVE
POC PROTEIN,UA: NEGATIVE
Spec Grav, UA: >=1.03 — AB
Urobilinogen, UA: 0.2 U/dL
pH, UA: 5.5

## 2023-12-25 MED ORDER — VIBEGRON 75 MG PO TABS: 75.0000 mg | ORAL_TABLET | Freq: Every day | ORAL | Status: DC

## 2023-12-25 MED ORDER — ESTRADIOL 0.01 % VA CREA
TOPICAL_CREAM | VAGINAL | 11 refills | Status: AC
  Filled 2023-12-25: qty 42.5, 100d supply, fill #0
  Filled 2024-01-07: qty 42.5, 90d supply, fill #1

## 2023-12-25 MED ORDER — VIBEGRON 75 MG PO TABS
75.0000 mg | ORAL_TABLET | Freq: Every day | ORAL | 5 refills | Status: DC
  Filled 2023-12-25 – 2024-01-14 (×2): qty 30, 30d supply, fill #0
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
## 2023-12-25 NOTE — Patient Instructions (Addendum)
 Today we talked about ways to manage bladder urgency such as altering your diet to avoid irritative beverages and foods (bladder diet) as well as attempting to decrease stress and other exacerbating factors.  You can also chew a plain Tums 1-3 times per day to make your urine less acidic, especially if you have eating/drinking acidic things.   There is a website with helpful information for people with bladder irritation, called the IC Network at https://www.ic-network.com. This website has more information about a healthy bladder diet and patient forums for support.  The Most Bothersome Foods* The Least Bothersome Foods*  Coffee - Regular & Decaf Tea - caffeinated Carbonated beverages - cola, non-colas, diet & caffeine-free Alcohols - Beer, Red Wine, White Wine, [address] - Grapefruit, McDonald, Orange, [employer] - Cranberry, Grapefruit, Orange, Pineapple Vegetables - Tomato & Tomato Products Flavor Enhancers - Hot peppers, Spicy foods, Chili, Horseradish, Vinegar, Monosodium glutamate (MSG) Artificial Sweeteners - NutraSweet, Sweet 'N Low, Equal (sweetener), Saccharin Ethnic foods - Mexican, Thai, Indian food Water  Milk - low-fat & whole Fruits - Bananas, Blueberries, Honeydew melon, Pears, Raisins, Watermelon Vegetables - Broccoli, [address] Sprouts, [place], [place], Cauliflower, Gilt Edge, Cucumber, Mushrooms, Peas, Radishes, Squash, Zucchini, White potatoes, Sweet potatoes & yams Poultry - Chicken, Eggs, Turkey, [employer] - Beef, Dipl[employer] Services Operational Officer, Lamb Seafood - Shrimp, [place] fish, Salmon Grains - Oat, Rice Snacks - Pretzels, Popcorn  *Mitch ALF et al. Diet and its role in interstitial cystitis/bladder pain syndrome (IC/BPS) and comorbid conditions. [employer]. BJU Int. 2012 Jan 11.    Use the estrogen cream x2 weekly. Please do not use the applicator. A blueberry sized amount onto the finger is perfect.   Start Gemtesa  75mg  daily for OAB.

## 2023-12-28 ENCOUNTER — Other Ambulatory Visit (HOSPITAL_COMMUNITY): Payer: Self-pay

## 2023-12-28 NOTE — Telephone Encounter (Signed)
 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  MRI scan not really indicated because if she is having symptoms I would either try another injection if it helped her last time or she would have to consider shoulder replacement.  If she is to the point where she is considering shoulder replacement like she had on the right-hand side I think that CT scan would be the better imaging study to get as a preoperative told before replacement.  Please call thanks.  We could do that then get CT scan if her pain is worsening to the point where she is thinking about shoulder replacement.

## 2023-12-31 ENCOUNTER — Other Ambulatory Visit (HOSPITAL_COMMUNITY): Payer: Self-pay

## 2023-12-31 MED ORDER — ALENDRONATE SODIUM 70 MG PO TABS
70.0000 mg | ORAL_TABLET | ORAL | 3 refills | Status: AC
  Filled 2023-12-31: qty 12, 84d supply, fill #0

## 2024-01-01 ENCOUNTER — Other Ambulatory Visit (HOSPITAL_COMMUNITY): Payer: Self-pay

## 2024-01-07 ENCOUNTER — Other Ambulatory Visit (HOSPITAL_COMMUNITY): Payer: Self-pay

## 2024-01-07 MED ORDER — PREGABALIN 75 MG PO CAPS
75.0000 mg | ORAL_CAPSULE | Freq: Every day | ORAL | 0 refills | Status: AC
  Filled 2024-01-07: qty 90, 90d supply, fill #0

## 2024-01-14 ENCOUNTER — Other Ambulatory Visit (HOSPITAL_COMMUNITY): Payer: Self-pay

## 2024-01-15 ENCOUNTER — Other Ambulatory Visit (HOSPITAL_COMMUNITY): Payer: Self-pay

## 2024-01-15 MED ORDER — MOUNJARO 2.5 MG/0.5ML ~~LOC~~ SOAJ
2.5000 mg | SUBCUTANEOUS | 0 refills | Status: AC
  Filled 2024-01-15 – 2024-01-18 (×2): qty 2, 28d supply, fill #0

## 2024-01-16 ENCOUNTER — Other Ambulatory Visit (HOSPITAL_COMMUNITY): Payer: Self-pay

## 2024-01-18 ENCOUNTER — Other Ambulatory Visit (HOSPITAL_COMMUNITY): Payer: Self-pay

## 2024-01-21 ENCOUNTER — Other Ambulatory Visit (HOSPITAL_COMMUNITY): Payer: Self-pay

## 2024-01-31 ENCOUNTER — Ambulatory Visit: Admitting: Obstetrics and Gynecology

## 2024-02-01 ENCOUNTER — Ambulatory Visit: Admitting: Obstetrics and Gynecology

## 2024-02-05 ENCOUNTER — Ambulatory Visit: Admitting: Obstetrics and Gynecology

## 2024-02-07 ENCOUNTER — Other Ambulatory Visit: Payer: Self-pay

## 2024-02-07 DIAGNOSIS — N3281 Overactive bladder: Secondary | ICD-10-CM

## 2024-02-07 MED ORDER — VIBEGRON 75 MG PO TABS: 75.0000 mg | ORAL_TABLET | Freq: Every day | ORAL | 5 refills | Status: DC

## 2024-02-07 MED ORDER — VIBEGRON 75 MG PO TABS: 75.0000 mg | ORAL_TABLET | Freq: Every day | ORAL | 1 refills | Status: AC

## 2024-02-07 NOTE — Addendum Note (Signed)
 Addended by: Jahson Emanuele, HYDE T on: 02/07/2024 05:00 PM   Modules accepted: Orders

## 2024-02-15 ENCOUNTER — Ambulatory Visit: Admitting: Obstetrics and Gynecology

## 2024-02-29 ENCOUNTER — Ambulatory Visit: Admitting: Obstetrics and Gynecology
